# Patient Record
Sex: Male | Born: 1935 | Race: White | Hispanic: No | Marital: Married | Smoking: Former smoker
Health system: Southern US, Community
[De-identification: ages and names within clinical notes are randomized; demographics above are authoritative.]

## PROBLEM LIST (undated history)

## (undated) DIAGNOSIS — F419 Anxiety disorder, unspecified: Secondary | ICD-10-CM

## (undated) DIAGNOSIS — G459 Transient cerebral ischemic attack, unspecified: Secondary | ICD-10-CM

## (undated) HISTORY — DX: Transient cerebral ischemic attack, unspecified: G45.9

## (undated) HISTORY — DX: Anxiety disorder, unspecified: F41.9

## (undated) HISTORY — PX: TONSILLECTOMY AND ADENOIDECTOMY: SUR1326

## (undated) HISTORY — PX: HAND SURGERY: SHX662

## (undated) HISTORY — PX: HEMORRHOID SURGERY: SHX153

---

## 2007-10-27 ENCOUNTER — Ambulatory Visit: Payer: Self-pay | Admitting: Unknown Physician Specialty

## 2008-11-06 ENCOUNTER — Ambulatory Visit: Payer: Self-pay | Admitting: Urology

## 2012-12-23 DIAGNOSIS — R339 Retention of urine, unspecified: Secondary | ICD-10-CM | POA: Insufficient documentation

## 2012-12-23 DIAGNOSIS — N200 Calculus of kidney: Secondary | ICD-10-CM | POA: Insufficient documentation

## 2012-12-23 DIAGNOSIS — N401 Enlarged prostate with lower urinary tract symptoms: Secondary | ICD-10-CM | POA: Insufficient documentation

## 2012-12-23 DIAGNOSIS — Z8042 Family history of malignant neoplasm of prostate: Secondary | ICD-10-CM | POA: Insufficient documentation

## 2013-03-03 ENCOUNTER — Ambulatory Visit: Payer: Self-pay | Admitting: Unknown Physician Specialty

## 2013-03-04 LAB — PATHOLOGY REPORT

## 2013-09-07 ENCOUNTER — Ambulatory Visit: Payer: Self-pay | Admitting: Internal Medicine

## 2014-07-05 DIAGNOSIS — M1990 Unspecified osteoarthritis, unspecified site: Secondary | ICD-10-CM | POA: Insufficient documentation

## 2014-07-05 DIAGNOSIS — I6930 Unspecified sequelae of cerebral infarction: Secondary | ICD-10-CM | POA: Insufficient documentation

## 2014-07-05 DIAGNOSIS — E785 Hyperlipidemia, unspecified: Secondary | ICD-10-CM | POA: Insufficient documentation

## 2014-07-05 DIAGNOSIS — E782 Mixed hyperlipidemia: Secondary | ICD-10-CM | POA: Insufficient documentation

## 2014-07-05 DIAGNOSIS — K219 Gastro-esophageal reflux disease without esophagitis: Secondary | ICD-10-CM | POA: Insufficient documentation

## 2014-07-05 DIAGNOSIS — F419 Anxiety disorder, unspecified: Secondary | ICD-10-CM | POA: Insufficient documentation

## 2014-07-05 DIAGNOSIS — I10 Essential (primary) hypertension: Secondary | ICD-10-CM | POA: Insufficient documentation

## 2014-08-16 ENCOUNTER — Ambulatory Visit: Payer: Self-pay | Admitting: Ophthalmology

## 2014-08-23 ENCOUNTER — Ambulatory Visit: Payer: Self-pay | Admitting: Ophthalmology

## 2014-10-25 ENCOUNTER — Ambulatory Visit: Payer: Self-pay | Admitting: Ophthalmology

## 2015-04-07 NOTE — Op Note (Signed)
PATIENT NAME:  James NormanGWINNETT, [name] MR#:  [id number] DATE OF BIRTH:  [birth year]  DATE OF PROCEDURE:  10/25/2014  PREOPERATIVE DIAGNOSIS:  Nuclear sclerotic cataract left eye.  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   PROCEDURE:  Phacoemulsification with posterior chamber intraocular lens implantation of the left eye.   LENS:  ZCB00 25.0-diopter posterior chamber intraocular lens.  ULTRASOUND TIME:  20% of 55 seconds.  CDE 11.0.  SURGEON:  [place]had Iolani Twilley, MD  ANESTHESIA:  Topical with tetracaine drops and 2% Xylocaine jelly.  COMPLICATIONS:  None.  DESCRIPTION OF PROCEDURE:  The patient was identified in the holding room and transported to the operating room and placed in the supine position under the operating microscope.  The left eye was identified as the operative eye and it was prepped and draped in the usual sterile ophthalmic fashion.  A 1 millimeter clear-corneal paracentesis was made at the 1:30 position.  The anterior chamber was filled with Viscoat viscoelastic.  A 2.4 millimeter keratome was used to make a near-clear corneal incision at the 10:30 position.  A curvilinear capsulorrhexis was made with a cystotome and capsulorrhexis forceps.  Balanced salt solution was used to hydrodissect and hydrodelineate the nucleus.  Phacoemulsification was then used in stop and chop fashion to remove the lens nucleus and epinucleus.  The remaining cortex was then removed using the irrigation and aspiration handpiece. Provisc was then placed into the capsular bag to distend it for lens placement.  A ZCB00 25.0-diopter lens was then injected into the capsular bag.  The remaining viscoelastic was aspirated.  Wounds were hydrated with balanced salt solution.  The anterior chamber was inflated to a physiologic pressure with balanced salt solution.  Next 0.1 mL of cefuroxime 10 mg/mL were injected into the anterior chamber  for a dose of 1 mg of intracameral antibiotic at the completion of the case. Miostat was placed into the anterior chamber to constrict the pupil.  No wound leaks were noted.  Topical Vigamox drops and Maxitrol ointment were applied to the eye.    The patient was taken to the recovery room in stable condition without complications of anesthesia or surgery.   ____________________________ Deirdre Evenerhadwick R. Gricel Copen, MD crb:MT D: 10/25/2014 14:53:00 ET T: 10/25/2014 16:52:01 ET JOB#: [id number]  cc: Deirdre Evenerhadwick R. Nechuma Boven, MD, <Dictator>     Lockie MolaHADWICK Jasdeep Kepner MD ELECTRONICALLY SIGNED 11/01/2014 11:30

## 2015-04-07 NOTE — Op Note (Signed)
PATIENT NAME:  James [name], James [name] MR#:  [name] DATE OF BIRTH:  Jun 10, 1934  DATE OF PROCEDURE:  08/23/2014  PREOPERATIVE DIAGNOSIS:  Nuclear sclerotic cataract, right eye.  POSTOPERATIVE DIAGNOSIS:  Nuclear sclerotic cataract, right eye.  PROCEDURE:  Phacoemulsification with posterior chamber intraocular lens implantation of the right eye.  LENS:  ZCBOO 25.0-diopter posterior chamber intraocular lens.  ULTRASOUND TIME: 19% of 1 minute, 7 seconds.  CDE 12.7.   SURGEON:  [place]had Lawayne Hartig, MD  ANESTHESIA:  Topical with tetracaine drops and 2% Xylocaine jelly.  COMPLICATIONS:  None.  DESCRIPTION OF PROCEDURE:  The patient was identified in the holding room and transported to the operating room and placed in the supine position under the operating microscope.  The right eye was identified as the operative eye and it was prepped and draped in the usual sterile ophthalmic fashion.  A 1 millimeter clear-corneal paracentesis was made at the 12 o'clock 1 mm position.  The anterior chamber was filled with Viscoat viscoelastic.  A 2.4 millimeter keratome was used to make a near-clear corneal incision at the 9 o'clock 2.4 mm position.  A curvilinear capsulorrhexis was made with a cystotome and capsulorrhexis forceps.  Balanced salt solution was used to hydrodissect and hydrodelineate the nucleus.  Phacoemulsification was then used in stop and chop fashion to remove the lens nucleus and epinucleus.  The remaining cortex was then removed using the irrigation and aspiration handpiece. Provisc was then placed into the capsular bag to distend it for lens placement.  A ZCBOO 25.0 diopter lens was then injected into the capsular bag.  The remaining viscoelastic was aspirated.  Wounds were hydrated with balanced salt solution.  The anterior chamber was inflated to a physiologic pressure with balanced salt solution.  0.1 mL of cefuroxime 10 mg/mL were injected into the anterior chamber for a dose of 1 mg of  intracameral antibiotic at the completion of the case. Miostat was placed into the anterior chamber to constrict the pupil.  No wound leaks were noted.  Topical Vigamox drops and Maxitrol ointment were applied to the eye.  The patient was taken to the recovery room in stable condition without complications of anesthesia or surgery.    ____________________________ Deirdre Evenerhadwick R. Layni Kreamer, MD crb:TT D: 08/23/2014 15:16:00 ET T: 08/23/2014 17:22:58 ET JOB#: [id number]  cc: Deirdre Evenerhadwick R. Jamille Yoshino, MD, <Dictator> Lockie MolaHADWICK Kerie Badger MD ELECTRONICALLY SIGNED 08/29/2014 21:49

## 2018-04-24 ENCOUNTER — Emergency Department
Admission: EM | Admit: 2018-04-24 | Discharge: 2018-04-24 | Disposition: A | Discharge status: Home / Self Care | Payer: Medicare PPO | Attending: Student in an Organized Health Care Education/Training Program | Admitting: Student in an Organized Health Care Education/Training Program

## 2018-04-24 ENCOUNTER — Other Ambulatory Visit: Payer: Self-pay

## 2018-04-24 ENCOUNTER — Emergency Department: Payer: Medicare PPO

## 2018-04-24 ENCOUNTER — Encounter: Payer: Self-pay | Admitting: Emergency Medicine

## 2018-04-24 DIAGNOSIS — Z87891 Personal history of nicotine dependence: Secondary | ICD-10-CM | POA: Insufficient documentation

## 2018-04-24 DIAGNOSIS — F13930 Sedative, hypnotic or anxiolytic use, unspecified with withdrawal, uncomplicated: Secondary | ICD-10-CM

## 2018-04-24 DIAGNOSIS — F13230 Sedative, hypnotic or anxiolytic dependence with withdrawal, uncomplicated: Secondary | ICD-10-CM | POA: Diagnosis not present

## 2018-04-24 DIAGNOSIS — R531 Weakness: Secondary | ICD-10-CM | POA: Diagnosis present

## 2018-04-24 LAB — CBC WITH DIFFERENTIAL/PLATELET
BASOS ABS: 0 10*3/uL (ref 0–0.1)
BASOS PCT: 1 %
EOS ABS: 0 10*3/uL (ref 0–0.7)
EOS PCT: 0 %
HCT: 43 % (ref 40.0–52.0)
Hemoglobin: 14.5 g/dL (ref 13.0–18.0)
Lymphocytes Relative: 9 %
Lymphs Abs: 0.7 10*3/uL — ABNORMAL LOW (ref 1.0–3.6)
MCH: 31.1 pg (ref 26.0–34.0)
MCHC: 33.7 g/dL (ref 32.0–36.0)
MCV: 92.3 fL (ref 80.0–100.0)
Monocytes Absolute: 0.4 10*3/uL (ref 0.2–1.0)
Monocytes Relative: 6 %
Neutro Abs: 6.5 10*3/uL (ref 1.4–6.5)
Neutrophils Relative %: 84 %
PLATELETS: 187 10*3/uL (ref 150–440)
RBC: 4.66 MIL/uL (ref 4.40–5.90)
RDW: 14.2 % (ref 11.5–14.5)
WBC: 7.7 10*3/uL (ref 3.8–10.6)

## 2018-04-24 LAB — COMPREHENSIVE METABOLIC PANEL
ALBUMIN: 4.3 g/dL (ref 3.5–5.0)
ALK PHOS: 57 U/L (ref 38–126)
ALT: 19 U/L (ref 17–63)
AST: 31 U/L (ref 15–41)
Anion gap: 6 (ref 5–15)
BUN: 21 mg/dL — ABNORMAL HIGH (ref 6–20)
CHLORIDE: 102 mmol/L (ref 101–111)
CO2: 28 mmol/L (ref 22–32)
CREATININE: 1 mg/dL (ref 0.61–1.24)
Calcium: 9.3 mg/dL (ref 8.9–10.3)
GFR calc non Af Amer: >60 mL/min (ref >60)
GLUCOSE: 214 mg/dL — AB (ref 65–99)
Potassium: 4 mmol/L (ref 3.5–5.1)
SODIUM: 136 mmol/L (ref 135–145)
Total Bilirubin: 0.6 mg/dL (ref 0.3–1.2)
Total Protein: 8.1 g/dL (ref 6.5–8.1)

## 2018-04-24 LAB — TROPONIN I

## 2018-04-24 MED ORDER — CHLORDIAZEPOXIDE HCL 25 MG PO CAPS
25.0000 mg | ORAL_CAPSULE | Freq: Once | ORAL | Status: AC
  Administered 2018-04-24: 25 mg via ORAL
  Filled 2018-04-24: qty 1

## 2018-04-24 MED ORDER — ALPRAZOLAM 1 MG PO TABS: 1.0000 mg | ORAL_TABLET | Freq: Every evening | ORAL | 0 refills | Status: AC | PRN

## 2018-04-24 MED ORDER — ALPRAZOLAM 0.5 MG PO TABS
1.0000 mg | ORAL_TABLET | Freq: Once | ORAL | Status: AC
  Administered 2018-04-24: 1 mg via ORAL
  Filled 2018-04-24: qty 2

## 2018-04-24 NOTE — ED Provider Notes (Signed)
[facility] Emergency Department Provider Note    First MD Initiated Contact with Patient 04/24/18 1106     (approximate)  I have reviewed the triage vital signs and the nursing notes.   HISTORY  Chief Complaint Weakness    HPI Murphy Duzan. is a 84 y.o. male presents to the ER with jittery sensation as well as sensation in his chest that he feels that someone was ripping breath out of his chest today.  Patient states he has been on Xanax for over 30 years taking 2 mg nightly noted on Tuesday that he had run out.  He did get a refill but he gets his medications from a pharmacy in [place] so he had not received them.  For the past 2 nights has not been able to sleep.  Feeling very anxious.  No hallucinations.  No chest pain or pressure at this time.  No headache.  No blurry vision.   History reviewed. No pertinent past medical history. No family history on file.  There are no active problems to display for this patient.     Prior to Admission medications   Not on File    Allergies Patient has no known allergies.    Social History Social History   Tobacco Use  . Smoking status: Former Smoker  Substance Use Topics  . Alcohol use: Not on file  . Drug use: Not on file    Review of Systems Patient denies headaches, rhinorrhea, blurry vision, numbness, shortness of breath, chest pain, edema, cough, abdominal pain, nausea, vomiting, diarrhea, dysuria, fevers, rashes or hallucinations unless otherwise stated above in HPI. ____________________________________________   PHYSICAL EXAM:  VITAL SIGNS: Vitals:   04/24/18 1122  BP: 121/62  Pulse: (!) 114  Resp: 20  Temp: 99 F (37.2 C)  SpO2: 96%    Constitutional: Alert and oriented.  in no acute distress. Eyes: Conjunctivae are normal.  Head: Atraumatic. Nose: No congestion/rhinnorhea. Mouth/Throat: Mucous membranes are moist.   Neck: No stridor. Painless ROM.    Cardiovascular: borderline tachycardic rate, regular rhythm. Grossly normal heart sounds.  Good peripheral circulation. Respiratory: Normal respiratory effort.  No retractions. Lungs CTAB. Gastrointestinal: Soft and nontender. No distention. No abdominal bruits. No CVA tenderness. Genitourinary:  Musculoskeletal: No lower extremity tenderness nor edema.  No joint effusions. Neurologic:  + tongue fasciculations with faint resting tremor. Normal speech and language. No gross focal neurologic deficits are appreciated. No facial droop Skin:  Skin is warm, dry and intact. No rash noted. Psychiatric: Mood and affect are normal. Speech and behavior are normal.  ____________________________________________   LABS (all labs ordered are listed, but only abnormal results are displayed)  No results found for this or any previous visit (from the past 24 hour(s)). ____________________________________________  EKG My review and personal interpretation at Time: 11:02   Indication: weakness  Rate: 115  Rhythm: sinus Axis: normal Other: nonspecific st abn, no stemi, poor r wave progression ____________________________________________  RADIOLOGY  I personally reviewed all radiographic images ordered to evaluate for the above acute complaints and reviewed radiology reports and findings.  These findings were personally discussed with the patient.  Please see medical record for radiology report.  ____________________________________________   PROCEDURES  Procedure(s) performed:  Procedures    Critical Care performed: no ____________________________________________   INITIAL IMPRESSION / ASSESSMENT AND PLAN / ED COURSE  Pertinent labs & imaging results that were available during my care of the patient were reviewed by me and considered  in my medical decision making (see chart for details).  DDX: Dehydration, sepsis, pna, uti, hypoglycemia, cva, drug effect, withdrawal,  encephalitis   Deuntae Kocsis. is a 84 y.o. who presents to the ED with was as described above.  Patient well-appearing.  History and exam is clinically consistent with acute benzodiazepine withdrawal.  No evidence of focal neuro deficits to suggest CVA.  Blood work is reassuring.  Patient given Xanax and Librium with improvement in his symptoms.  He is able to improve with a steady gait and tolerating oral hydration.  No evidence of ACS.  Given his presentation will be discharged with a short course of Xanax until his home medication can be refilled.  Discussed with patient and family alternative medications and that they could discuss steps to weaning off the benzodiazepine with their PCP.      As part of my medical decision making, I reviewed the following data within the electronic MEDICAL RECORD NUMBER Nursing notes reviewed and incorporated, Labs reviewed, notes from prior ED visits.   ____________________________________________   FINAL CLINICAL IMPRESSION(S) / ED DIAGNOSES  Final diagnoses:  Withdrawal from benzodiazepine, uncomplicated (HCC)      NEW MEDICATIONS STARTED DURING THIS VISIT:  New Prescriptions   No medications on file     Note:  This document was prepared using Dragon voice recognition software and may include unintentional dictation errors.    Willy Eddy, MD 04/24/18 1340

## 2018-04-24 NOTE — ED Triage Notes (Signed)
States general weakness had sudden onset this am. No focal weakness. Denies chest pain or SOB. States regularly takes xanax and ran out 4 days ago.

## 2018-04-24 NOTE — ED Triage Notes (Signed)
Wife states that he hasn't slept in 2nights, waiting for his meds to be mailed, pt states that he stayed in bed all day yesterday, no energy, pt states while up walking this am he felt a "whoosh" across his chest and his legs feel weak as well as his voice is [employer]

## 2018-04-24 NOTE — ED Notes (Signed)
Takes 1/2 sandwich and juice. tol well. amb in room by self without difficulty.

## 2018-08-04 ENCOUNTER — Ambulatory Visit: Payer: Medicare PPO | Admitting: Urology

## 2018-08-04 ENCOUNTER — Encounter: Payer: Self-pay | Admitting: Urology

## 2018-08-04 VITALS — BP 115/75 | HR 93 | Ht 69.0 in | Wt 158.5 lb

## 2018-08-04 DIAGNOSIS — N138 Other obstructive and reflux uropathy: Secondary | ICD-10-CM

## 2018-08-04 DIAGNOSIS — N401 Enlarged prostate with lower urinary tract symptoms: Secondary | ICD-10-CM

## 2018-08-04 MED ORDER — FINASTERIDE 5 MG PO TABS: 5.0000 mg | ORAL_TABLET | Freq: Every day | ORAL | 3 refills | Status: DC

## 2018-08-04 MED ORDER — TAMSULOSIN HCL 0.4 MG PO CAPS: 0.4000 mg | ORAL_CAPSULE | Freq: Every day | ORAL | 3 refills | Status: DC

## 2018-08-04 NOTE — Progress Notes (Signed)
08/04/2018 1:06 PM   Laveda Normanharles F Dejoy Jr. Oct 25, 1934 [id number]  Referring provider: Marguarite ArbourSparks, Jeffrey D, MD [address] [place], [place] [address]  Chief Complaint  Patient presents with  . Establish Care    HPI: 84 year old male presents to establish local urologic care.  He has been followed by Dr. Achilles Dunkope for several years for BPH and [facility].  He remains on finasteride and Flomax.  He denies bothersome lower urinary tract symptoms.  He denies dysuria or gross hematuria.  He has no flank, abdominal, pelvic or scrotal pain.  He has a family history of prostate cancer.  He had a PSA checked by his PCP in May 2019 which was 0.04 (uncorrected).   PMH: Past Medical History:  Diagnosis Date  . Anxiety   . TIA (transient ischemic attack)     Surgical History: History reviewed. No pertinent surgical history.  Home Medications:  Allergies as of 08/04/2018   No Known Allergies     Medication List        Accurate as of 08/04/18  1:06 PM. Always use your most recent med list.          ALPRAZolam 1 MG tablet Commonly known as:  XANAX Take 1 tablet (1 mg total) by mouth at bedtime as needed for sleep.   amitriptyline 25 MG tablet Commonly known as:  ELAVIL   clopidogrel 75 MG tablet Commonly known as:  PLAVIX   dutasteride 0.5 MG capsule Commonly known as:  AVODART Take by mouth.   finasteride 5 MG tablet Commonly known as:  PROSCAR Take by mouth.       Allergies: No Known Allergies  Family History: Family History  Problem Relation Age of Onset  . Prostate cancer Father     Social History:  reports that he has quit smoking. He quit after 35.00 years of use. He has never used smokeless tobacco. He reports that he drank alcohol. He reports that he does not use drugs.  ROS: UROLOGY Frequent Urination?: Yes Hard to postpone urination?: No Burning/pain with urination?: No Get up at night to urinate?: No Leakage of urine?:  No Urine stream starts and stops?: No Trouble starting stream?: No Do you have to strain to urinate?: No Blood in urine?: No Urinary tract infection?: No Sexually transmitted disease?: No Injury to kidneys or bladder?: No Painful intercourse?: No Weak stream?: No Erection problems?: No Penile pain?: No  Gastrointestinal Nausea?: No Vomiting?: No Indigestion/heartburn?: No Diarrhea?: No Constipation?: No  Constitutional Fever: No Night sweats?: No Weight loss?: No Fatigue?: No  Skin Skin rash/lesions?: No Itching?: No  Eyes Blurred vision?: No Double vision?: No  Ears/Nose/Throat Sore throat?: No Sinus problems?: No  Hematologic/Lymphatic Swollen glands?: No Easy bruising?: Yes  Cardiovascular Leg swelling?: No Chest pain?: No  Respiratory Cough?: No Shortness of breath?: No  Endocrine Excessive thirst?: No  Musculoskeletal Back pain?: No Joint pain?: No  Neurological Headaches?: No Dizziness?: No  Psychologic Depression?: No Anxiety?: Yes  Physical Exam: BP 115/75 (BP Location: Left Arm, Patient Position: Sitting, Cuff Size: Normal)   Pulse 93   Ht 5\' 9"  (1.753 m)   Wt 158 lb 8 oz (71.9 kg)   BMI 23.41 kg/m   Constitutional:  Alert and oriented, No acute distress. HEENT: [place] AT, moist mucus membranes.  Trachea midline, no masses. Cardiovascular: No clubbing, cyanosis, or edema. Respiratory: Normal respiratory effort, no increased work of breathing. GI: Abdomen is soft, nontender, nondistended,  no abdominal masses GU: No CVA tenderness.  Prostate 30 g, smooth without nodules Lymph: No cervical or inguinal lymphadenopathy. Skin: No rashes, bruises or suspicious lesions. Neurologic: Grossly intact, no focal deficits, moving all 4 extremities. Psychiatric: Normal mood and affect.   Assessment & Plan:   84 year old male with stable lower urinary tract symptoms on tamsulosin and finasteride.  Both of these medications were refilled.   Continue annual follow-up.  Based on his age and extremely low PSA would recommend discontinuing prostate cancer screening based on AUA recommendations.   Riki AltesScott C Avanna Sowder, MD  [facility] [address], [address] [place], [place] [address] [phone]

## 2019-04-27 DIAGNOSIS — G629 Polyneuropathy, unspecified: Secondary | ICD-10-CM | POA: Insufficient documentation

## 2019-08-05 ENCOUNTER — Ambulatory Visit: Payer: Medicare PPO | Admitting: Urology

## 2019-08-05 ENCOUNTER — Encounter: Payer: Self-pay | Admitting: Urology

## 2019-09-30 ENCOUNTER — Encounter: Payer: Self-pay | Admitting: Urology

## 2019-09-30 ENCOUNTER — Other Ambulatory Visit: Payer: Self-pay

## 2019-09-30 ENCOUNTER — Ambulatory Visit (INDEPENDENT_AMBULATORY_CARE_PROVIDER_SITE_OTHER): Payer: Medicare PPO | Admitting: Urology

## 2019-09-30 VITALS — BP 112/76 | HR 115 | Ht 68.0 in | Wt 162.0 lb

## 2019-09-30 DIAGNOSIS — N401 Enlarged prostate with lower urinary tract symptoms: Secondary | ICD-10-CM | POA: Diagnosis not present

## 2019-09-30 NOTE — Progress Notes (Signed)
09/30/2019 11:31 AM   James [name]. 1934/06/26 [id number]  Referring provider: Marguarite Arbour, MD [address] [place],  [place] [address]  Chief Complaint  Patient presents with  . Benign Prostatic Hypertrophy    HPI: 85 y.o. male with a history of BPH previously followed by Dr. Achilles Dunk.  I initially saw him on 08/04/2018 and he presents for routine follow-up.  He remains on tamsulosin/finasteride and has no bothersome lower urinary tract symptoms.  IPSS completed today was 0/35.  Denies dysuria, gross hematuria and he has no flank, abdominal or pelvic pain.  PMH: Past Medical History:  Diagnosis Date  . Anxiety   . TIA (transient ischemic attack)     Surgical History: No past surgical history on file.  Home Medications:  Allergies as of 09/30/2019   No Known Allergies     Medication List       Accurate as of September 30, 2019 11:31 AM. If you have any questions, ask your nurse or doctor.        ALPRAZolam 1 MG tablet Commonly known as: XANAX TAKE 2 TABLETS (2 MG TOTAL) BY MOUTH NIGHTLY AS NEEDED FOR SLEEP   amitriptyline 25 MG tablet Commonly known as: ELAVIL   clopidogrel 75 MG tablet Commonly known as: PLAVIX   finasteride 5 MG tablet Commonly known as: PROSCAR Take 1 tablet (5 mg total) by mouth daily.   tamsulosin 0.4 MG Caps capsule Commonly known as: FLOMAX Take 1 capsule (0.4 mg total) by mouth daily.       Allergies: No Known Allergies  Family History: Family History  Problem Relation Age of Onset  . Prostate cancer Father     Social History:  reports that he has quit smoking. He quit after 35.00 years of use. He has never used smokeless tobacco. He reports previous alcohol use. He reports that he does not use drugs.  ROS: UROLOGY Frequent Urination?: Yes Hard to postpone urination?: No Burning/pain with urination?: No Get up at night to urinate?: No Leakage of urine?: No Urine stream starts and stops?: No Trouble  starting stream?: Yes Do you have to strain to urinate?: No Blood in urine?: No Urinary tract infection?: No Sexually transmitted disease?: No Injury to kidneys or bladder?: No Painful intercourse?: No Weak stream?: No Erection problems?: No Penile pain?: No  Gastrointestinal Nausea?: No Vomiting?: No Indigestion/heartburn?: No Diarrhea?: No Constipation?: No  Constitutional Fever: No Night sweats?: No Weight loss?: No Fatigue?: No  Skin Skin rash/lesions?: No Itching?: Yes  Eyes Blurred vision?: No Double vision?: No  Ears/Nose/Throat Sore throat?: No Sinus problems?: Yes  Hematologic/Lymphatic Swollen glands?: No Easy bruising?: No  Cardiovascular Leg swelling?: No Chest pain?: No  Respiratory Cough?: No Shortness of breath?: No  Endocrine Excessive thirst?: No  Musculoskeletal Back pain?: Yes Joint pain?: No  Neurological Headaches?: No Dizziness?: No  Psychologic Depression?: No Anxiety?: No  Physical Exam: BP 112/76 (BP Location: Left Arm, Patient Position: Sitting, Cuff Size: Normal)   Pulse (!) 115   Ht 5\' 8"  (1.727 m)   Wt 162 lb (73.5 kg)   BMI 24.63 kg/m   Constitutional:  Alert and oriented, No acute distress. HEENT: [place] AT, moist mucus membranes.  Trachea midline, no masses. Cardiovascular: No clubbing, cyanosis, or edema. Respiratory: Normal respiratory effort, no increased work of breathing. GI: Abdomen is soft, nontender, nondistended, no abdominal masses GU: No CVA tenderness Lymph: No cervical or inguinal lymphadenopathy. Skin: No rashes, bruises or suspicious lesions. Neurologic:  Grossly intact, no focal deficits, moving all 4 extremities. Psychiatric: Normal mood and affect.   Assessment & Plan:    - BPH with lower urinary tract symptoms Stable voiding symptoms on tamsulosin/finasteride.   Abbie Sons, [place] [address], [place], [place] [address] [phone]

## 2019-10-02 ENCOUNTER — Encounter: Payer: Self-pay | Admitting: Urology

## 2019-10-02 MED ORDER — TAMSULOSIN HCL 0.4 MG PO CAPS: 0.4000 mg | ORAL_CAPSULE | Freq: Every day | ORAL | 3 refills | Status: DC

## 2019-10-02 MED ORDER — FINASTERIDE 5 MG PO TABS: 5.0000 mg | ORAL_TABLET | Freq: Every day | ORAL | 3 refills | Status: DC

## 2019-10-31 ENCOUNTER — Other Ambulatory Visit: Payer: Self-pay | Admitting: Neurology

## 2019-10-31 DIAGNOSIS — R200 Anesthesia of skin: Secondary | ICD-10-CM

## 2019-10-31 DIAGNOSIS — R202 Paresthesia of skin: Secondary | ICD-10-CM

## 2019-11-11 ENCOUNTER — Other Ambulatory Visit: Payer: Self-pay | Admitting: Urology

## 2019-11-11 ENCOUNTER — Ambulatory Visit
Admission: RE | Admit: 2019-11-11 | Discharge: 2019-11-11 | Disposition: A | Discharge status: Home / Self Care | Payer: Medicare PPO | Source: Ambulatory Visit | Attending: Neurology | Admitting: Neurology

## 2019-11-11 ENCOUNTER — Other Ambulatory Visit: Payer: Self-pay

## 2019-11-11 DIAGNOSIS — R202 Paresthesia of skin: Secondary | ICD-10-CM | POA: Insufficient documentation

## 2019-11-11 DIAGNOSIS — R200 Anesthesia of skin: Secondary | ICD-10-CM | POA: Insufficient documentation

## 2019-11-11 MED ORDER — GADOBUTROL 1 MMOL/ML IV SOLN
7.0000 mL | Freq: Once | INTRAVENOUS | Status: AC | PRN
  Administered 2019-11-11: 7 mL via INTRAVENOUS

## 2019-11-14 ENCOUNTER — Other Ambulatory Visit (HOSPITAL_COMMUNITY): Payer: Self-pay | Admitting: Internal Medicine

## 2019-11-14 ENCOUNTER — Other Ambulatory Visit: Payer: Self-pay | Admitting: Internal Medicine

## 2019-11-14 DIAGNOSIS — G629 Polyneuropathy, unspecified: Secondary | ICD-10-CM

## 2019-11-17 ENCOUNTER — Ambulatory Visit
Admission: RE | Admit: 2019-11-17 | Discharge: 2019-11-17 | Disposition: A | Discharge status: Home / Self Care | Payer: Medicare PPO | Source: Ambulatory Visit | Attending: Internal Medicine | Admitting: Internal Medicine

## 2019-11-17 ENCOUNTER — Other Ambulatory Visit: Payer: Self-pay

## 2019-11-17 DIAGNOSIS — G629 Polyneuropathy, unspecified: Secondary | ICD-10-CM | POA: Diagnosis present

## 2019-11-29 ENCOUNTER — Other Ambulatory Visit: Payer: Self-pay

## 2019-11-29 ENCOUNTER — Ambulatory Visit (INDEPENDENT_AMBULATORY_CARE_PROVIDER_SITE_OTHER): Payer: Medicare PPO | Admitting: Vascular Surgery

## 2019-11-29 ENCOUNTER — Encounter (INDEPENDENT_AMBULATORY_CARE_PROVIDER_SITE_OTHER): Payer: Self-pay | Admitting: Vascular Surgery

## 2019-11-29 VITALS — BP 108/71 | HR 101 | Resp 16 | Wt 160.4 lb

## 2019-11-29 DIAGNOSIS — I10 Essential (primary) hypertension: Secondary | ICD-10-CM | POA: Diagnosis not present

## 2019-11-29 DIAGNOSIS — I739 Peripheral vascular disease, unspecified: Secondary | ICD-10-CM

## 2019-11-29 DIAGNOSIS — E785 Hyperlipidemia, unspecified: Secondary | ICD-10-CM

## 2019-11-29 NOTE — Assessment & Plan Note (Signed)
blood pressure control important in reducing the progression of atherosclerotic disease. On appropriate oral medications.  

## 2019-11-29 NOTE — Patient Instructions (Signed)
Peripheral Vascular Disease  Peripheral vascular disease (PVD) is a disease of the blood vessels that are not part of your heart and brain. A simple term for PVD is poor circulation. In most cases, PVD narrows the blood vessels that carry blood from your heart to the rest of your body. This can reduce the supply of blood to your arms, legs, and internal organs, like your stomach or kidneys. However, PVD most often affects a person's lower legs and feet. Without treatment, PVD tends to get worse. PVD can also lead to acute ischemic limb. This is when an arm or leg suddenly cannot get enough blood. This is a medical emergency. Follow these instructions at home: Lifestyle  Do not use any products that contain nicotine or tobacco, such as cigarettes and e-cigarettes. If you need help quitting, ask your doctor.  Lose weight if you are overweight. Or, stay at a healthy weight as told by your doctor.  Eat a diet that is low in fat and cholesterol. If you need help, ask your doctor.  Exercise regularly. Ask your doctor for activities that are right for you. General instructions  Take over-the-counter and prescription medicines only as told by your doctor.  Take good care of your feet: ? Wear comfortable shoes that fit well. ? Check your feet often for any cuts or sores.  Keep all follow-up visits as told by your doctor This is important. Contact a doctor if:  You have cramps in your legs when you walk.  You have leg pain when you are at rest.  You have coldness in a leg or foot.  Your skin changes.  You are unable to get or have an erection (erectile dysfunction).  You have cuts or sores on your feet that do not heal. Get help right away if:  Your arm or leg turns cold, numb, and blue.  Your arms or legs become red, warm, swollen, painful, or numb.  You have chest pain.  You have trouble breathing.  You suddenly have weakness in your face, arm, or leg.  You become very  confused or you cannot speak.  You suddenly have a very bad headache.  You suddenly cannot see. Summary  Peripheral vascular disease (PVD) is a disease of the blood vessels.  A simple term for PVD is poor circulation. Without treatment, PVD tends to get worse.  Treatment may include exercise, low fat and low cholesterol diet, and quitting smoking. This information is not intended to replace advice given to you by your health care provider. Make sure you discuss any questions you have with your health care provider. Document Released: 02/25/2010 Document Revised: 11/13/2017 Document Reviewed: 01/08/2017 Elsevier Patient Education  2020 [employer]  

## 2019-11-29 NOTE — Progress Notes (Signed)
Patient ID: James Aho., male   DOB: Oct 19, 1934, 85 y.o.   MRN: [id number]  Chief Complaint  Patient presents with  . New Patient (Initial Visit)    ref Doy Hutching for PAD    HPI James [name]. is a 85 y.o. male.  I am asked to see the patient by Dr. Doy Hutching for evaluation of PAD.  The patient is a fair historian but he does describe symptoms concerning for claudication.  He says his legs hurt and get tired easily even with short amounts of exercise or activity.  He says he does try to do some upper body exercises to keep fit but he does not like walking due to the pain in his legs.  Both legs are affected but the left may be slightly worse.  No open ulceration or infection.  No fevers or chills.  The patient describes this having gone on for months and seems to be getting gradually worse.  There is no clear inciting event or causative factor that started the symptoms.  Nothing has really made this better.  His primary care physician ordered ABIs at rest which showed a right ABI of 0.98 and a left ABI 0.91 but specific mention was made of considering checking these with activity.  The waveforms were fairly good.   Past Medical History:  Diagnosis Date  . Anxiety   . TIA (transient ischemic attack)     Past Surgical History:  Procedure Laterality Date  . HAND SURGERY Left   . HEMORRHOID SURGERY    . TONSILLECTOMY AND ADENOIDECTOMY       Family History  Problem Relation Age of Onset  . Prostate cancer Father   no bleeding disorders, clotting disorders, autoimmune diseases, or aneurysms  Social History   Tobacco Use  . Smoking status: Former Smoker    Years: 35.00  . Smokeless tobacco: Never Used  Substance Use Topics  . Alcohol use: Not Currently  . Drug use: Never    No Known Allergies  Current Outpatient Medications  Medication Sig Dispense Refill  . ALPRAZolam (XANAX) 1 MG tablet TAKE 2 TABLETS (2 MG TOTAL) BY MOUTH NIGHTLY AS NEEDED FOR SLEEP    .  amitriptyline (ELAVIL) 25 MG tablet     . clopidogrel (PLAVIX) 75 MG tablet     . finasteride (PROSCAR) 5 MG tablet TAKE 1 TABLET EVERY DAY 90 tablet 3  . tamsulosin (FLOMAX) 0.4 MG CAPS capsule TAKE 1 CAPSULE EVERY DAY 90 capsule 3   No current facility-administered medications for this visit.      REVIEW OF SYSTEMS (Negative unless checked)  Constitutional: [] Weight loss  [] Fever  [] Chills Cardiac: [] Chest pain   [] Chest pressure   [] Palpitations   [] Shortness of breath when laying flat   [] Shortness of breath at rest   [] Shortness of breath with exertion. Vascular:  [] Pain in legs with walking   [] Pain in legs at rest   [] Pain in legs when laying flat   [] Claudication   [] Pain in feet when walking  [] Pain in feet at rest  [] Pain in feet when laying flat   [] History of DVT   [] Phlebitis   [] Swelling in legs   [] Varicose veins   [] Non-healing ulcers Pulmonary:   [] Uses home oxygen   [] Productive cough   [] Hemoptysis   [] Wheeze  [] COPD   [] Asthma Neurologic:  [] Dizziness  [] Blackouts   [] Seizures   [] History of stroke   [] History of TIA  [] Aphasia   []   Temporary blindness   [] Dysphagia   [] Weakness or numbness in arms   [] Weakness or numbness in legs Musculoskeletal:  [] Arthritis   [] Joint swelling   [] Joint pain   [] Low back pain Hematologic:  [] Easy bruising  [] Easy bleeding   [] Hypercoagulable state   [] Anemic  [] Hepatitis Gastrointestinal:  [] Blood in stool   [] Vomiting blood  [x] Gastroesophageal reflux/heartburn   [] Abdominal pain Genitourinary:  [] Chronic kidney disease   [] Difficult urination  [] Frequent urination  [] Burning with urination   [] Hematuria Skin:  [] Rashes   [] Ulcers   [] Wounds Psychological:  [x] History of anxiety   []  History of major depression.    Physical Exam BP 108/71 (BP Location: Right Arm)   Pulse (!) 101   Resp 16   Wt 160 lb 6.4 oz (72.8 kg)   BMI 24.39 kg/m  Gen:  WD/WN, NAD Head: [place]/AT, No temporalis wasting.  Ear/Nose/Throat: Hearing grossly  intact, nares w/o erythema or drainage, oropharynx w/o Erythema/Exudate Eyes: Conjunctiva clear, sclera non-icteric  Neck: trachea midline.  No JVD.  Pulmonary:  Good air movement, respirations not labored, no use of accessory muscles  Cardiac: slightly tachycardic Vascular:  Vessel Right Left  Radial Palpable Palpable                          DP 2+ 2+  PT 1+ 1+   Gastrointestinal:. No masses, surgical incisions, or scars. Musculoskeletal: M/S 5/5 throughout.  Extremities without ischemic changes.  No deformity or atrophy. No edema. Neurologic: Sensation grossly intact in extremities.  Symmetrical.  Speech is fluent. Motor exam as listed above. Psychiatric: Judgment intact, Mood & affect appropriate for pt's clinical situation. Dermatologic: No rashes or ulcers noted.  No cellulitis or open wounds.    Radiology MR BRAIN W WO CONTRAST  Result Date: 11/11/2019 CLINICAL DATA:  Numbness pain and heaviness of bilateral feet since 2018 EXAM: MRI HEAD WITHOUT AND WITH CONTRAST TECHNIQUE: Multiplanar, multiecho pulse sequences of the brain and surrounding structures were obtained without and with intravenous contrast. CONTRAST:  54mL GADAVIST GADOBUTROL 1 MMOL/ML IV SOLN COMPARISON:  09/07/2013 FINDINGS: Brain: Confluent FLAIR hyperintensity in the cerebral white matter consistent with chronic small vessel ischemia. Discrete remote perforator infarct in the right basal ganglia. Remote lacunar infarct in the left thalamus. There is cerebral volume loss with asymmetric involvement of the medial right temporal lobe that has progressed from 2014. Accentuated dilated perivascular spaces in the deep gray nuclei, often attributed to aging or hypertension. No acute or subacute infarct, acute hemorrhage, hydrocephalus, or collection. Vascular: Normal flow voids Skull and upper cervical spine: Normal marrow signal Sinuses/Orbits: Bilateral cataract resection IMPRESSION: 1. Advanced chronic small vessel  ischemia. 2. Brain atrophy with asymmetric greater involvement of the medial right temporal lobe that has progressed from 2014. 3. No reversible finding. Electronically Signed   By: M.D.   On: 11/11/2019 20:07   ARTERIAL ABI (SCREENING LOWER EXTREMITY)  Result Date: 11/18/2019 CLINICAL DATA:  Previous tobacco abuse.  Leg numbness. EXAM: NONINVASIVE PHYSIOLOGIC VASCULAR STUDY OF BILATERAL LOWER EXTREMITIES TECHNIQUE: Evaluation of both lower extremities were performed at rest, including calculation of ankle-brachial indices with single level Doppler, pressure and pulse volume recording. COMPARISON:  None. FINDINGS: Right ABI:  0.98 Left ABI:  0.91 Right Lower Extremity:  Normal arterial waveforms at the ankle. Left Lower Extremity:  Normal arterial waveforms at the ankle. IMPRESSION: Mildly decreased bilateral ABIs at rest suggest borderline peripheral arterial disease. Consider post exercise  testing if  symptoms persist. Electronically Signed   By: Corlis Leak  Hassell M.D.   On: 11/18/2019 09:03    Labs No results found for this or any previous visit (from the past 2160 hour(s)).  Assessment/Plan:  HTN (hypertension) blood pressure control important in reducing the progression of atherosclerotic disease. On appropriate oral medications.   Hyperlipidemia lipid control important in reducing the progression of atherosclerotic disease.    PAD (peripheral artery disease) (HCC) The patient describes some degree of claudication symptoms as well as likely some neuropathy of the lower extremities.  Perfusion can certainly contributes to the neuropathy symptoms as well.  His overall perfusion is relatively well-preserved at rest.  We had a long talk today about the pathophysiology and natural history of peripheral arterial disease.  He has been recommended to have physical therapy by another physician and I would strongly recommend that.  That is likely to build up some collaterals and improve  his lower extremity perfusion.  I would recommend a trial of increased activity and then reassessing in 1 to 2 months with duplex studies for further evaluation on any levels of stenosis or disease.  He does not have any immediate limb threatening symptoms that would require intervention at this time.  They voiced their understanding and are agreeable to proceed with our plan of care.      Festus BarrenJason Justine Dines 11/29/2019, 11:42 AM   This note was created with Dragon medical transcription system.  Any errors from dictation are unintentional.

## 2019-11-29 NOTE — Assessment & Plan Note (Signed)
lipid control important in reducing the progression of atherosclerotic disease.   

## 2019-11-29 NOTE — Assessment & Plan Note (Signed)
The patient describes some degree of claudication symptoms as well as likely some neuropathy of the lower extremities.  Perfusion can certainly contributes to the neuropathy symptoms as well.  His overall perfusion is relatively well-preserved at rest.  We had a long talk today about the pathophysiology and natural history of peripheral arterial disease.  He has been recommended to have physical therapy by another physician and I would strongly recommend that.  That is likely to build up some collaterals and improve his lower extremity perfusion.  I would recommend a trial of increased activity and then reassessing in 1 to 2 months with duplex studies for further evaluation on any levels of stenosis or disease.  He does not have any immediate limb threatening symptoms that would require intervention at this time.  They voiced their understanding and are agreeable to proceed with our plan of care.

## 2020-01-03 ENCOUNTER — Encounter (INDEPENDENT_AMBULATORY_CARE_PROVIDER_SITE_OTHER): Payer: Medicare PPO

## 2020-01-03 ENCOUNTER — Ambulatory Visit (INDEPENDENT_AMBULATORY_CARE_PROVIDER_SITE_OTHER): Payer: Medicare PPO | Admitting: Vascular Surgery

## 2020-01-17 ENCOUNTER — Encounter (INDEPENDENT_AMBULATORY_CARE_PROVIDER_SITE_OTHER): Payer: Self-pay | Admitting: Vascular Surgery

## 2020-01-17 ENCOUNTER — Ambulatory Visit (INDEPENDENT_AMBULATORY_CARE_PROVIDER_SITE_OTHER): Payer: Medicare PPO | Admitting: Vascular Surgery

## 2020-01-17 ENCOUNTER — Ambulatory Visit (INDEPENDENT_AMBULATORY_CARE_PROVIDER_SITE_OTHER): Payer: Medicare PPO

## 2020-01-17 ENCOUNTER — Other Ambulatory Visit: Payer: Self-pay

## 2020-01-17 VITALS — BP 115/75 | HR 91 | Resp 18 | Ht 68.0 in | Wt 160.0 lb

## 2020-01-17 DIAGNOSIS — E785 Hyperlipidemia, unspecified: Secondary | ICD-10-CM | POA: Diagnosis not present

## 2020-01-17 DIAGNOSIS — I10 Essential (primary) hypertension: Secondary | ICD-10-CM

## 2020-01-17 DIAGNOSIS — I739 Peripheral vascular disease, unspecified: Secondary | ICD-10-CM | POA: Diagnosis not present

## 2020-01-17 NOTE — Assessment & Plan Note (Signed)
We did duplex studies today which showed some mildly elevated velocities in the left SFA but no clear hemodynamically significant stenosis in either lower extremity below the inguinal ligament.  There were biphasic waveforms distally which would suggest potential aortoiliac disease.  His ABIs have been seen to be only mildly reduced previously. At this point, his perfusion is relatively well-maintained.  Although there may be some aortoiliac disease, his symptoms have not progressed.  We discussed angiogram versus a several month follow-up with aortoiliac duplex.  He would prefer the latter which is certainly reasonable without any limb threatening symptoms.  We will see him back with aortoiliac duplex in several months.

## 2020-01-17 NOTE — Progress Notes (Signed)
MRN : [id number]  James [name]. is a 86 y.o. (1934/07/31) male who presents with chief complaint of  Chief Complaint  Patient presents with  . Follow-up    ultrasound  .  History of Present Illness: Patient returns today in follow up of his leg pain and PAD.  He still is bothered more by numbness and neuropathic symptoms than true claudication.  He does have some claudication symptoms particularly with vigorous activity.  At this point, his symptoms are about the same as they were at his first visit a few months ago.  No ulceration or infection.  We did duplex studies today which showed some mildly elevated velocities in the left SFA but no clear hemodynamically significant stenosis in either lower extremity below the inguinal ligament.  There were biphasic waveforms distally which would suggest potential aortoiliac disease.  His ABIs have been seen to be only mildly reduced previously.  Current Outpatient Medications  Medication Sig Dispense Refill  . ALPRAZolam (XANAX) 1 MG tablet TAKE 2 TABLETS (2 MG TOTAL) BY MOUTH NIGHTLY AS NEEDED FOR SLEEP    . amitriptyline (ELAVIL) 25 MG tablet     . clopidogrel (PLAVIX) 75 MG tablet     . finasteride (PROSCAR) 5 MG tablet TAKE 1 TABLET EVERY DAY 90 tablet 3  . tamsulosin (FLOMAX) 0.4 MG CAPS capsule TAKE 1 CAPSULE EVERY DAY 90 capsule 3   No current facility-administered medications for this visit.    Past Medical History:  Diagnosis Date  . Anxiety   . TIA (transient ischemic attack)     Past Surgical History:  Procedure Laterality Date  . HAND SURGERY Left   . HEMORRHOID SURGERY    . TONSILLECTOMY AND ADENOIDECTOMY      Family History  Problem Relation Age of Onset  . Prostate cancer Father   no bleeding disorders, clotting disorders, autoimmune diseases, or aneurysms  Social History        Tobacco Use  . Smoking status: Former Smoker    Years: 35.00  . Smokeless tobacco: Never Used  Substance Use Topics    . Alcohol use: Not Currently  . Drug use: Never     No Known Allergies  REVIEW OF SYSTEMS (Negative unless checked)  Constitutional: [] ?Weight loss  [] ?Fever  [] ?Chills Cardiac: [] ?Chest pain   [] ?Chest pressure   [] ?Palpitations   [] ?Shortness of breath when laying flat   [] ?Shortness of breath at rest   [] ?Shortness of breath with exertion. Vascular:  [] ?Pain in legs with walking   [] ?Pain in legs at rest   [] ?Pain in legs when laying flat   [] ?Claudication   [] ?Pain in feet when walking  [] ?Pain in feet at rest  [] ?Pain in feet when laying flat   [] ?History of DVT   [] ?Phlebitis   [] ?Swelling in legs   [] ?Varicose veins   [] ?Non-healing ulcers Pulmonary:   [] ?Uses home oxygen   [] ?Productive cough   [] ?Hemoptysis   [] ?Wheeze  [] ?COPD   [] ?Asthma Neurologic:  [] ?Dizziness  [] ?Blackouts   [] ?Seizures   [] ?History of stroke   [] ?History of TIA  [] ?Aphasia   [] ?Temporary blindness   [] ?Dysphagia   [] ?Weakness or numbness in arms   [] ?Weakness or numbness in legs Musculoskeletal:  [] ?Arthritis   [] ?Joint swelling   [] ?Joint pain   [] ?Low back pain Hematologic:  [] ?Easy bruising  [] ?Easy bleeding   [] ?Hypercoagulable state   [] ?Anemic  [] ?Hepatitis Gastrointestinal:  [] ?Blood in stool   [] ?Vomiting blood  [x] ?Gastroesophageal reflux/heartburn   [] ?  Abdominal pain Genitourinary:  [] ?Chronic kidney disease   [] ?Difficult urination  [] ?Frequent urination  [] ?Burning with urination   [] ?Hematuria Skin:  [] ?Rashes   [] ?Ulcers   [] ?Wounds Psychological:  [x] ?History of anxiety   [] ? History of major depression.  Physical Examination  BP 115/75 (BP Location: Right Arm)   Pulse 91   Resp 18   Ht 5\' 8"  (1.727 m)   Wt 160 lb (72.6 kg)   BMI 24.33 kg/m  Gen:  WD/WN, NAD Head: [place]/AT, No temporalis wasting. Ear/Nose/Throat: Hearing grossly intact, nares w/o erythema or drainage Eyes: Conjunctiva clear. Sclera non-icteric Neck: Supple.  Trachea midline Pulmonary:  Good air movement, no use of  accessory muscles.  Cardiac: RRR, no JVD Vascular:  Vessel Right Left  Radial Palpable Palpable                          PT Palpable Palpable  DP Palpable Palpable    Musculoskeletal: M/S 5/5 throughout.  No deformity or atrophy.  No edema. Neurologic: Sensation grossly intact in extremities.  Symmetrical.  Speech is fluent.  Psychiatric: Judgment intact, Mood & affect appropriate for pt's clinical situation. Dermatologic: No rashes or ulcers noted.  No cellulitis or open wounds.       Labs No results found for this or any previous visit (from the past 2160 hour(s)).  Radiology No results found.  Assessment/Plan HTN (hypertension) blood pressure control important in reducing the progression of atherosclerotic disease. On appropriate oral medications.   Hyperlipidemia lipid control important in reducing the progression of atherosclerotic disease.   PAD (peripheral artery disease) (HCC) We did duplex studies today which showed some mildly elevated velocities in the left SFA but no clear hemodynamically significant stenosis in either lower extremity below the inguinal ligament.  There were biphasic waveforms distally which would suggest potential aortoiliac disease.  His ABIs have been seen to be only mildly reduced previously. At this point, his perfusion is relatively well-maintained.  Although there may be some aortoiliac disease, his symptoms have not progressed.  We discussed angiogram versus a several month follow-up with aortoiliac duplex.  He would prefer the latter which is certainly reasonable without any limb threatening symptoms.  We will see him back with aortoiliac duplex in several months.    Leotis Pain, MD  01/17/2020 12:42 PM    This note was created with Dragon medical transcription system.  Any errors from dictation are purely unintentional

## 2020-04-17 ENCOUNTER — Ambulatory Visit (INDEPENDENT_AMBULATORY_CARE_PROVIDER_SITE_OTHER): Payer: Medicare PPO | Admitting: Vascular Surgery

## 2020-04-17 ENCOUNTER — Other Ambulatory Visit: Payer: Self-pay

## 2020-04-17 ENCOUNTER — Ambulatory Visit (INDEPENDENT_AMBULATORY_CARE_PROVIDER_SITE_OTHER): Payer: Medicare PPO

## 2020-04-17 ENCOUNTER — Encounter (INDEPENDENT_AMBULATORY_CARE_PROVIDER_SITE_OTHER): Payer: Self-pay | Admitting: Vascular Surgery

## 2020-04-17 VITALS — BP 127/72 | HR 98 | Resp 16 | Wt 160.4 lb

## 2020-04-17 DIAGNOSIS — I739 Peripheral vascular disease, unspecified: Secondary | ICD-10-CM | POA: Diagnosis not present

## 2020-04-17 DIAGNOSIS — I10 Essential (primary) hypertension: Secondary | ICD-10-CM | POA: Diagnosis not present

## 2020-04-17 DIAGNOSIS — E785 Hyperlipidemia, unspecified: Secondary | ICD-10-CM | POA: Diagnosis not present

## 2020-04-17 NOTE — Progress Notes (Signed)
MRN : [id number]  James [name]. is a 86 y.o. ([birth year]) male who presents with chief complaint of  Chief Complaint  Patient presents with  . Follow-up    ultrasound follow up  .  History of Present Illness: Patient returns today in follow up of leg weakness and balance issues.  We performed exercise ABIs today to see if he had a drop in his ABI with exercise.  He did not.  His ABIs were in the normal range at 1.1 on the right and 1.0 on the left and had appropriate increase with exercise consistent with no significant arterial insufficiency.  Current Outpatient Medications  Medication Sig Dispense Refill  . ALPRAZolam (XANAX) 1 MG tablet TAKE 2 TABLETS (2 MG TOTAL) BY MOUTH NIGHTLY AS NEEDED FOR SLEEP    . amitriptyline (ELAVIL) 25 MG tablet     . clopidogrel (PLAVIX) 75 MG tablet     . finasteride (PROSCAR) 5 MG tablet TAKE 1 TABLET EVERY DAY 90 tablet 3  . tamsulosin (FLOMAX) 0.4 MG CAPS capsule TAKE 1 CAPSULE EVERY DAY 90 capsule 3   No current facility-administered medications for this visit.    Past Medical History:  Diagnosis Date  . Anxiety   . TIA (transient ischemic attack)     Past Surgical History:  Procedure Laterality Date  . HAND SURGERY Left   . HEMORRHOID SURGERY    . TONSILLECTOMY AND ADENOIDECTOMY      Family History  Problem Relation Age of Onset  . Prostate cancer Father   no bleeding disorders, clotting disorders, autoimmune diseases, or aneurysms  Social History        Tobacco Use  . Smoking status: Former Smoker    Years: 35.00  . Smokeless tobacco: Never Used  Substance Use Topics  . Alcohol use: Not Currently  . Drug use: Never     No Known Allergies  REVIEW OF SYSTEMS(Negative unless checked)  Constitutional: [] ??Weight loss[] ??Fever[] ??Chills Cardiac:[] ??Chest pain[] ??Chest pressure[] ??Palpitations [] ??Shortness of breath when laying flat [] ??Shortness of breath at rest [] ??Shortness of  breath with exertion. Vascular: [] ??Pain in legs with walking[] ??Pain in legsat rest[] ??Pain in legs when laying flat [] ??Claudication [] ??Pain in feet when walking [] ??Pain in feet at rest [] ??Pain in feet when laying flat [] ??History of DVT [] ??Phlebitis [] ??Swelling in legs [] ??Varicose veins [] ??Non-healing ulcers Pulmonary: [] ??Uses home oxygen [] ??Productive cough[] ??Hemoptysis [] ??Wheeze [] ??COPD [] ??Asthma Neurologic: [] ??Dizziness [] ??Blackouts [] ??Seizures [] ??History of stroke [] ??History of TIA[] ??Aphasia [] ??Temporary blindness[] ??Dysphagia [] ??Weaknessor numbness in arms [] ??Weakness or numbnessin legs Musculoskeletal: [] ??Arthritis [] ??Joint swelling [] ??Joint pain [] ??Low back pain Hematologic:[] ??Easy bruising[] ??Easy bleeding [] ??Hypercoagulable state [] ??Anemic [] ??Hepatitis Gastrointestinal:[] ??Blood in stool[] ??Vomiting blood[x] ??Gastroesophageal reflux/heartburn[] ??Abdominal pain Genitourinary: [] ??Chronic kidney disease [] ??Difficulturination [] ??Frequenturination [] ??Burning with urination[] ??Hematuria Skin: [] ??Rashes [] ??Ulcers [] ??Wounds Psychological: [x] ??History of anxiety[] ??History of major depression.    Physical Examination  BP 127/72 (BP Location: Right Arm)   Pulse 98   Resp 16   Wt 160 lb 6.4 oz (72.8 kg)   BMI 24.39 kg/m  Gen:  WD/WN, NAD.  Appears younger than stated age Head: [place]/AT, No temporalis wasting. Ear/Nose/Throat: Hearing somewhat decreased, nares w/o erythema or drainage Eyes: Conjunctiva clear. Sclera non-icteric Neck: Supple.  Trachea midline Pulmonary:  Good air movement, no use of accessory muscles.  Cardiac: RRR, no JVD Vascular:  Vessel Right Left  Radial Palpable Palpable                          PT Palpable Palpable  DP Palpable Palpable    Musculoskeletal: M/S  5/5 throughout.  No deformity or atrophy. Neurologic:  Sensation grossly intact in extremities.  Symmetrical.  Speech is fluent.  Psychiatric: Judgment intact, Mood & affect appropriate for pt's clinical situation. Dermatologic: No rashes or ulcers noted.  No cellulitis or open wounds.       Labs No results found for this or any previous visit (from the past 2160 hour(s)).  Radiology VAS [place] ABI WITH/WO TBI  Result Date: 04/17/2020 LOWER EXTREMITY DOPPLER STUDY Indications: Leg heaviness bilaterally. Other Factors: Post exercise study to evaluate for claudication.  Performing Technologist: Salvadore Farber RVT  Examination Guidelines: A complete evaluation includes at minimum, Doppler waveform signals and systolic blood pressure reading at the level of bilateral brachial, anterior tibial, and posterior tibial arteries, when vessel segments are accessible. Bilateral testing is considered an integral part of a complete examination. Photoelectric Plethysmograph (PPG) waveforms and toe systolic pressure readings are included as required and additional duplex testing as needed. Limited examinations for reoccurring indications may be performed as noted.  ABI Findings: +--------+------------------+-----+---------+--------+ Right   Rt Pressure (mmHg)IndexWaveform Comment  +--------+------------------+-----+---------+--------+ [id number]                                      +--------+------------------+-----+---------+--------+ ATA     143               1.04 triphasic         +--------+------------------+-----+---------+--------+ PTA     156               1.13 triphasic         +--------+------------------+-----+---------+--------+ +--------+------------------+-----+---------+-------+ Left    Lt Pressure (mmHg)IndexWaveform Comment +--------+------------------+-----+---------+-------+ [id number]                                     +--------+------------------+-----+---------+-------+ ATA     133               0.96 triphasic         +--------+------------------+-----+---------+-------+ PTA     138               1.00 triphasic        +--------+------------------+-----+---------+-------+ +-------+-----------+-----------+------------+------------+ ABI/TBIToday's ABIToday's TBIPrevious ABIPrevious TBI +-------+-----------+-----------+------------+------------+ Right  1.13                  .98                      +-------+-----------+-----------+------------+------------+ Left   1.00                  .91                      +-------+-----------+-----------+------------+------------+ Bilateral ABIs appear increased compared to prior study on 11/18/2019. Post exercise ABI's show normal increase in velocities. Patient experienced no claudication symptoms during exercise.  Summary: Right: Resting right ankle-brachial index is within normal range. No evidence of significant right lower extremity arterial disease. Left: Resting left ankle-brachial index is within normal range. No evidence of significant left lower extremity arterial disease.  *See table(s) above for measurements and observations.  Electronically signed by Festus Barren MD on 04/17/2020 at 10:17:05 AM.   Final     Assessment/Plan HTN (hypertension) blood pressure control important in reducing the progression of atherosclerotic disease. On appropriate oral medications.  Hyperlipidemia lipid control important in reducing the progression of atherosclerotic disease.  PAD (peripheral artery disease) (HCC) His ABIs were in the normal range at 1.1 on the right and 1.0 on the left and had appropriate increase with exercise consistent with no significant arterial insufficiency.  He did have some mild stenosis and plaque on previous duplex studies but nothing that appeared hemodynamically significant.  At this point, an annual follow-up with noninvasive studies would be appropriate unless further problems develop in the interim.    Leotis Pain,  MD  04/17/2020 10:20 AM    This note was created with Dragon medical transcription system.  Any errors from dictation are purely unintentional

## 2020-04-17 NOTE — Assessment & Plan Note (Signed)
His ABIs were in the normal range at 1.1 on the right and 1.0 on the left and had appropriate increase with exercise consistent with no significant arterial insufficiency.  He did have some mild stenosis and plaque on previous duplex studies but nothing that appeared hemodynamically significant.  At this point, an annual follow-up with noninvasive studies would be appropriate unless further problems develop in the interim.

## 2020-10-01 ENCOUNTER — Ambulatory Visit: Payer: Medicare PPO | Admitting: Urology

## 2020-10-10 ENCOUNTER — Other Ambulatory Visit: Payer: Self-pay

## 2020-10-10 ENCOUNTER — Ambulatory Visit: Payer: Medicare PPO | Admitting: Urology

## 2020-10-10 ENCOUNTER — Encounter: Payer: Self-pay | Admitting: Urology

## 2020-10-10 VITALS — BP 112/75 | HR 108 | Ht 68.0 in | Wt 152.0 lb

## 2020-10-10 DIAGNOSIS — N401 Enlarged prostate with lower urinary tract symptoms: Secondary | ICD-10-CM | POA: Diagnosis not present

## 2020-10-10 MED ORDER — FINASTERIDE 5 MG PO TABS: 5.0000 mg | ORAL_TABLET | Freq: Every day | ORAL | 3 refills | Status: DC

## 2020-10-10 MED ORDER — TAMSULOSIN HCL 0.4 MG PO CAPS: 0.4000 mg | ORAL_CAPSULE | Freq: Every day | ORAL | 3 refills | Status: DC

## 2020-10-10 NOTE — Progress Notes (Signed)
   10/10/2020 1:12 PM   James Norman Jr. 05-07-1934 [id number]  Referring provider: Marguarite Arbour, MD [address] [facility] [place],  [place] [address]  Chief Complaint  Patient presents with  . Benign Prostatic Hypertrophy    HPI: 86 y.o. male presents for annual follow-up.   BPH on finasteride/tamsulosin  No changes since last years visit  Denies dysuria or gross hematuria  Denies flank, abdominal or pelvic pain  Does note difficulty voiding with first morning void which subsequently resolves   PMH: Past Medical History:  Diagnosis Date  . Anxiety   . TIA (transient ischemic attack)     Surgical History: Past Surgical History:  Procedure Laterality Date  . HAND SURGERY Left   . HEMORRHOID SURGERY    . TONSILLECTOMY AND ADENOIDECTOMY      Home Medications:  Allergies as of 10/10/2020   No Known Allergies     Medication List       Accurate as of October 10, 2020  1:12 PM. If you have any questions, ask your nurse or doctor.        ALPRAZolam 1 MG tablet Commonly known as: XANAX TAKE 2 TABLETS (2 MG TOTAL) BY MOUTH NIGHTLY AS NEEDED FOR SLEEP   amitriptyline 25 MG tablet Commonly known as: ELAVIL   clopidogrel 75 MG tablet Commonly known as: PLAVIX   finasteride 5 MG tablet Commonly known as: PROSCAR TAKE 1 TABLET EVERY DAY   tamsulosin 0.4 MG Caps capsule Commonly known as: FLOMAX TAKE 1 CAPSULE EVERY DAY       Allergies: No Known Allergies  Family History: Family History  Problem Relation Age of Onset  . Prostate cancer Father     Social History:  reports that he has quit smoking. He quit after 35.00 years of use. He has never used smokeless tobacco. He reports previous alcohol use. He reports that he does not use drugs.   Physical Exam: BP 112/75   Pulse (!) 108   Ht 5\' 8"  (1.727 m)   Wt 152 lb (68.9 kg)   BMI 23.11 kg/m   Constitutional:  Alert and oriented, No acute distress. HEENT: [place] AT,  moist mucus membranes.  Trachea midline, no masses. Cardiovascular: No clubbing, cyanosis, or edema. Respiratory: Normal respiratory effort, no increased work of breathing. Skin: No rashes, bruises or suspicious lesions. Neurologic: Grossly intact, no focal deficits, moving all 4 extremities. Psychiatric: Normal mood and affect.   Assessment & Plan:    1.  BPH with LUTS  Stable on tamsulosin/finasteride which was refilled  Complains of difficulty voiding at first morning void  Discussed with him invasive options including UroLift however he states his symptoms are not bothersome enough that he desires any intervention at this time  Continue annual follow-up   , MD  [facility] [address] [place], [place] [phone]

## 2021-04-16 ENCOUNTER — Encounter (INDEPENDENT_AMBULATORY_CARE_PROVIDER_SITE_OTHER): Payer: Self-pay | Admitting: Vascular Surgery

## 2021-04-16 ENCOUNTER — Ambulatory Visit (INDEPENDENT_AMBULATORY_CARE_PROVIDER_SITE_OTHER): Payer: Medicare PPO | Admitting: Vascular Surgery

## 2021-04-16 ENCOUNTER — Encounter (INDEPENDENT_AMBULATORY_CARE_PROVIDER_SITE_OTHER): Payer: Medicare PPO

## 2021-05-03 ENCOUNTER — Encounter (INDEPENDENT_AMBULATORY_CARE_PROVIDER_SITE_OTHER): Payer: Self-pay

## 2021-05-03 ENCOUNTER — Ambulatory Visit (INDEPENDENT_AMBULATORY_CARE_PROVIDER_SITE_OTHER): Payer: Medicare PPO | Admitting: Vascular Surgery

## 2021-05-03 ENCOUNTER — Encounter (INDEPENDENT_AMBULATORY_CARE_PROVIDER_SITE_OTHER): Payer: Medicare PPO

## 2021-10-10 ENCOUNTER — Other Ambulatory Visit: Payer: Self-pay

## 2021-10-10 ENCOUNTER — Ambulatory Visit: Payer: Medicare PPO | Admitting: Urology

## 2021-10-10 ENCOUNTER — Other Ambulatory Visit: Payer: Self-pay | Admitting: Urology

## 2021-10-10 ENCOUNTER — Encounter: Payer: Self-pay | Admitting: Urology

## 2021-10-10 VITALS — BP 130/74 | HR 84 | Ht 71.0 in | Wt 150.0 lb

## 2021-10-10 DIAGNOSIS — N401 Enlarged prostate with lower urinary tract symptoms: Secondary | ICD-10-CM | POA: Diagnosis not present

## 2021-10-10 LAB — BLADDER SCAN AMB NON-IMAGING: Scan Result: 94

## 2021-10-10 MED ORDER — FINASTERIDE 5 MG PO TABS: 5.0000 mg | ORAL_TABLET | Freq: Every day | ORAL | 3 refills | Status: DC

## 2021-10-10 MED ORDER — TAMSULOSIN HCL 0.4 MG PO CAPS: 0.4000 mg | ORAL_CAPSULE | Freq: Every day | ORAL | 3 refills | Status: DC

## 2021-10-10 NOTE — Progress Notes (Signed)
   10/10/2021 1:36 PM   James Norman Jr. 1934-07-30 [id number]  Referring provider: Marguarite Arbour, MD [address] [facility] [place],  [place] [address]  Chief Complaint  Patient presents with   Benign Prostatic Hypertrophy    Urologic history: 1.  BPH with LUTS Tamsulosin/finasteride   HPI: 87 y.o. presents for annual follow-up.  Doing well since last visit No bothersome LUTS IPSS 5/35 Remains on tamsulosin/finasteride Denies dysuria, gross hematuria Denies flank, abdominal or pelvic pain   PMH: Past Medical History:  Diagnosis Date   Anxiety    TIA (transient ischemic attack)     Surgical History: Past Surgical History:  Procedure Laterality Date   HAND SURGERY Left    HEMORRHOID SURGERY     TONSILLECTOMY AND ADENOIDECTOMY      Home Medications:  Allergies as of 10/10/2021   No Known Allergies      Medication List        Accurate as of October 10, 2021  1:36 PM. If you have any questions, ask your nurse or doctor.          ALPRAZolam 1 MG tablet Commonly known as: XANAX TAKE 2 TABLETS (2 MG TOTAL) BY MOUTH NIGHTLY AS NEEDED FOR SLEEP   amitriptyline 25 MG tablet Commonly known as: ELAVIL   clopidogrel 75 MG tablet Commonly known as: PLAVIX   finasteride 5 MG tablet Commonly known as: PROSCAR Take 1 tablet (5 mg total) by mouth daily.   tamsulosin 0.4 MG Caps capsule Commonly known as: FLOMAX Take 1 capsule (0.4 mg total) by mouth daily.        Allergies: No Known Allergies  Family History: Family History  Problem Relation Age of Onset   Prostate cancer Father     Social History:  reports that he has quit smoking. His smoking use included cigarettes. He has never used smokeless tobacco. He reports that he does not currently use alcohol. He reports that he does not use drugs.   Physical Exam: BP 130/74   Pulse 84   Ht 5\' 11"  (1.803 m)   Wt 150 lb (68 kg)   BMI 20.92 kg/m   Constitutional:   Alert and oriented, No acute distress. HEENT:  AT, moist mucus membranes.  Trachea midline, no masses. Cardiovascular: No clubbing, cyanosis, or edema. Respiratory: Normal respiratory effort, no increased work of breathing. Psychiatric: Normal mood and affect.   Assessment & Plan:    1. Benign localized prostatic hyperplasia with lower urinary tract symptoms (LUTS) Stable voiding symptoms on tamsulosin/finasteride Refills sent to pharmacy Bladder scan PVR today 94 mL Continue annual follow-up or earlier as needed for worsening voiding symptoms   , MD  [facility] [address] [name], [place] [phone]

## 2021-11-20 ENCOUNTER — Encounter: Payer: Self-pay | Admitting: Emergency Medicine

## 2021-11-20 ENCOUNTER — Emergency Department
Admission: EM | Admit: 2021-11-20 | Discharge: 2021-11-20 | Disposition: A | Discharge status: Home / Self Care | Payer: Medicare PPO | Attending: Emergency Medicine | Admitting: Emergency Medicine

## 2021-11-20 ENCOUNTER — Other Ambulatory Visit: Payer: Self-pay

## 2021-11-20 ENCOUNTER — Emergency Department: Payer: Medicare PPO

## 2021-11-20 DIAGNOSIS — Z20822 Contact with and (suspected) exposure to covid-19: Secondary | ICD-10-CM | POA: Insufficient documentation

## 2021-11-20 DIAGNOSIS — R42 Dizziness and giddiness: Secondary | ICD-10-CM | POA: Insufficient documentation

## 2021-11-20 DIAGNOSIS — Z5321 Procedure and treatment not carried out due to patient leaving prior to being seen by health care provider: Secondary | ICD-10-CM | POA: Insufficient documentation

## 2021-11-20 DIAGNOSIS — R531 Weakness: Secondary | ICD-10-CM | POA: Diagnosis present

## 2021-11-20 LAB — URINALYSIS, ROUTINE W REFLEX MICROSCOPIC
Bacteria, UA: NONE SEEN
Bilirubin Urine: NEGATIVE
Glucose, UA: NEGATIVE mg/dL
Hgb urine dipstick: NEGATIVE
Ketones, ur: NEGATIVE mg/dL
Leukocytes,Ua: NEGATIVE
Nitrite: NEGATIVE
Protein, ur: NEGATIVE mg/dL
Specific Gravity, Urine: 1.015 (ref 1.005–1.030)
Squamous Epithelial / HPF: NONE SEEN (ref 0–5)
WBC, UA: NONE SEEN WBC/hpf (ref 0–5)
pH: 7 (ref 5.0–8.0)

## 2021-11-20 LAB — BASIC METABOLIC PANEL
Anion gap: 7 (ref 5–15)
BUN: 24 mg/dL — ABNORMAL HIGH (ref 8–23)
CO2: 25 mmol/L (ref 22–32)
Calcium: 9.2 mg/dL (ref 8.9–10.3)
Chloride: 105 mmol/L (ref 98–111)
Creatinine, Ser: 0.9 mg/dL (ref 0.61–1.24)
GFR, Estimated: >60 mL/min (ref >60)
Glucose, Bld: 103 mg/dL — ABNORMAL HIGH (ref 70–99)
Potassium: 4.2 mmol/L (ref 3.5–5.1)
Sodium: 137 mmol/L (ref 135–145)

## 2021-11-20 LAB — CBC
HCT: 41 % (ref 39.0–52.0)
Hemoglobin: 13.6 g/dL (ref 13.0–17.0)
MCH: 30.4 pg (ref 26.0–34.0)
MCHC: 33.2 g/dL (ref 30.0–36.0)
MCV: 91.5 fL (ref 80.0–100.0)
Platelets: 170 10*3/uL (ref 150–400)
RBC: 4.48 MIL/uL (ref 4.22–5.81)
RDW: 13.2 % (ref 11.5–15.5)
WBC: 5.3 10*3/uL (ref 4.0–10.5)
nRBC: 0 % (ref 0.0–0.2)

## 2021-11-20 LAB — RESP PANEL BY RT-PCR (FLU A&B, COVID) ARPGX2
Influenza A by PCR: NEGATIVE
Influenza B by PCR: NEGATIVE
SARS Coronavirus 2 by RT PCR: NEGATIVE

## 2021-11-20 NOTE — ED Provider Notes (Signed)
  Emergency Medicine Provider Triage Evaluation Note  James [name]. , a 87 y.o.male,  was evaluated in triage.  Pt complains of weakness.  Patient states that he has been feeling increasingly more weak over the past few days.  Reports some discomfort in his feet bilaterally, lightheadedness, and urinary incontinence that occurred yesterday.  Otherwise no remarkable symptoms.   Review of Systems  Positive: Weakness, lightheadedness, discomfort in feet. Negative: Denies fever, chest pain, vomiting  Physical Exam   Vitals:   11/20/21 1512  BP: 117/82  Pulse: 95  Resp: 18  Temp: 98 F (36.7 C)  SpO2: 98%   Gen:   Awake, no distress   Resp:  Normal effort  MSK:   Moves extremities without difficulty  Other:    Medical Decision Making  Given the patient's initial medical screening exam, the following diagnostic evaluation has been ordered. The patient will be placed in the appropriate treatment space, once one is available, to complete the evaluation and treatment. I have discussed the plan of care with the patient and I have advised the patient that an ED physician or mid-level practitioner will reevaluate their condition after the test results have been received, as the results may give them additional insight into the type of treatment they may need.    Diagnostics: Labs, EKG, respiratory panel, CXR  Treatments: none immediately   Varney Daily, PA 11/20/21 1517    Chesley Noon, MD 11/20/21 2031

## 2021-11-20 NOTE — ED Triage Notes (Signed)
Pt comes via [facility] with c/o weakness that has been ongoing. Family reports this has been study decline over last week. Pt has not been eating. Pt states some lightheadedness.  BP-130/80 HR-84 O2-95% RA T-98.7 CBG-108

## 2022-08-28 ENCOUNTER — Other Ambulatory Visit: Payer: Self-pay | Admitting: Urology

## 2022-10-10 ENCOUNTER — Ambulatory Visit: Payer: Medicare PPO | Admitting: Urology

## 2022-10-13 ENCOUNTER — Encounter (INDEPENDENT_AMBULATORY_CARE_PROVIDER_SITE_OTHER): Payer: Self-pay

## 2022-11-09 ENCOUNTER — Other Ambulatory Visit: Payer: Self-pay | Admitting: Urology

## 2023-03-28 IMAGING — CR DG CHEST 2V
1 series · 2 of 2 positions shown · non-contrast
Comparison: Chest radiograph 04/24/2018

CLINICAL DATA: Weakness

EXAM:
CHEST - 2 VIEW

[Series 1: dg chest 2 view · 0.14mm/px · 2 of 2 slices shown]
[im 1/2]
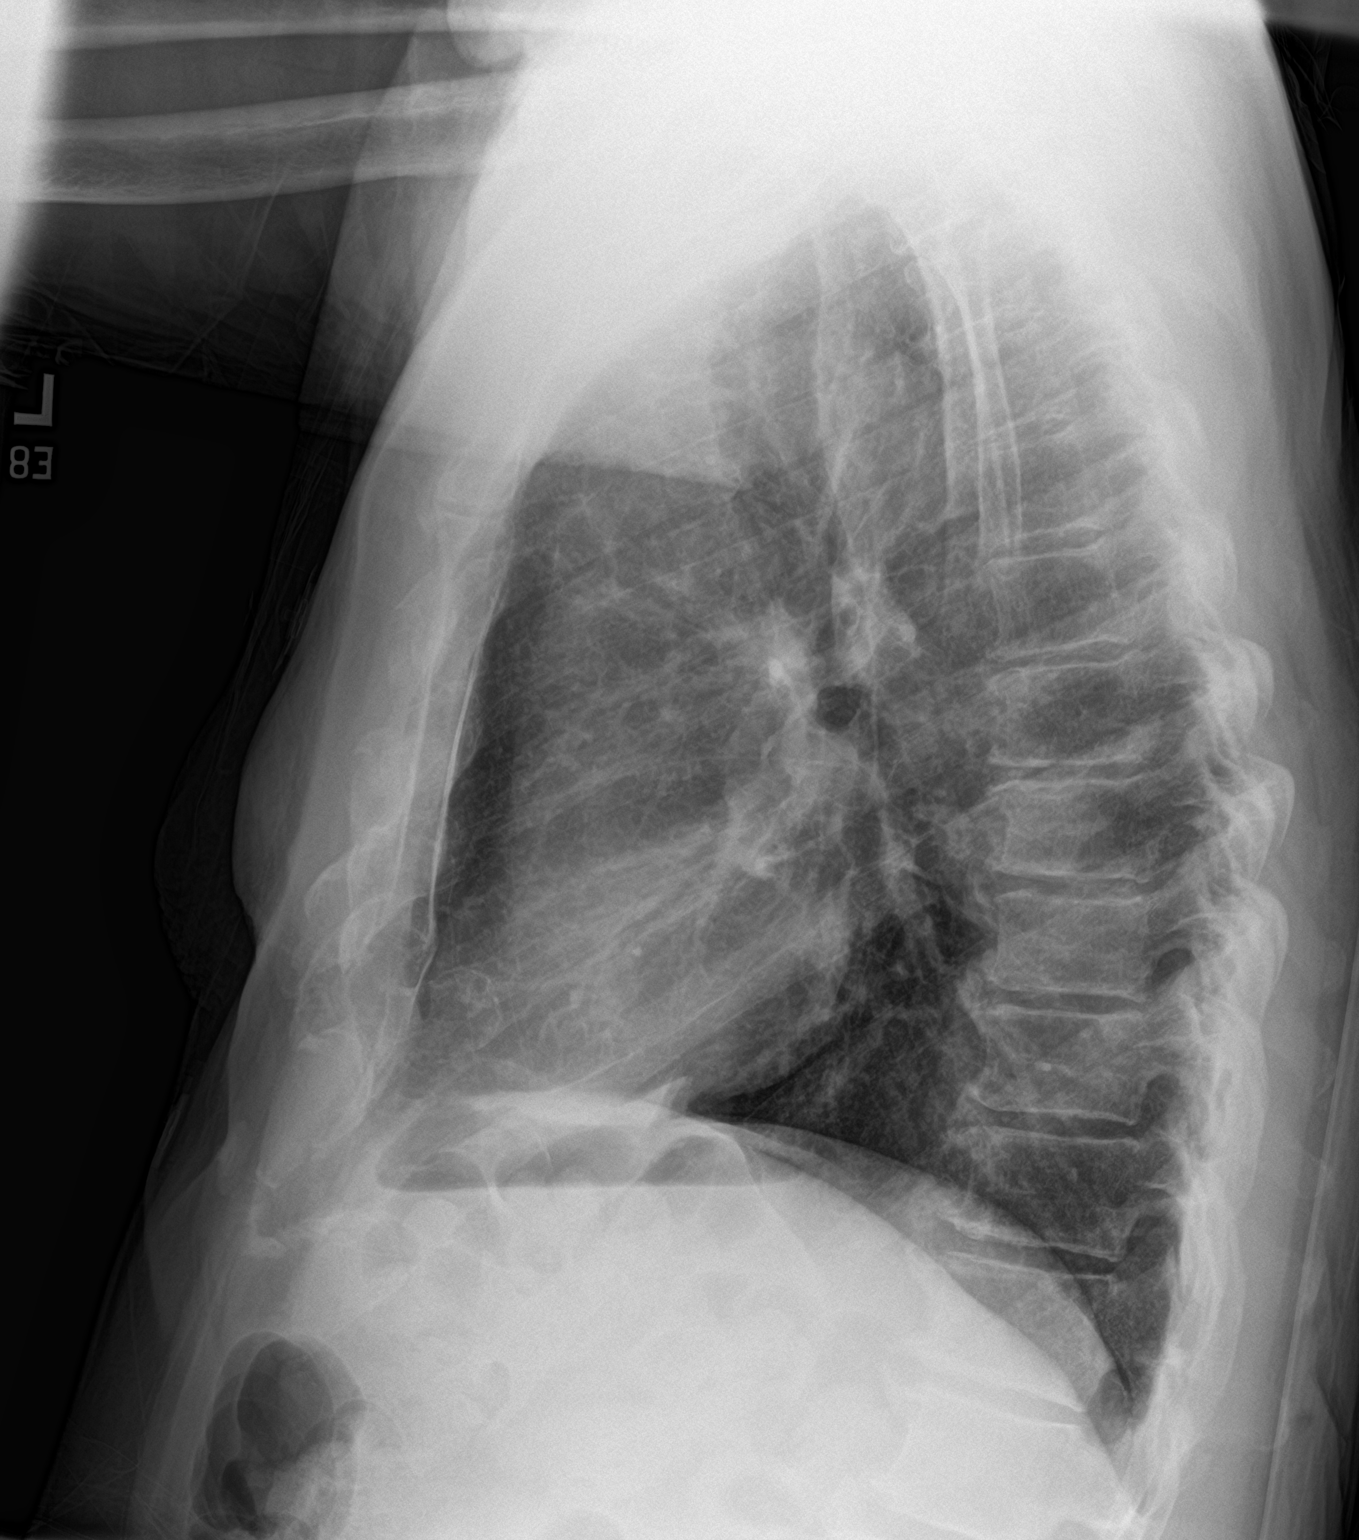
[im 2/2]
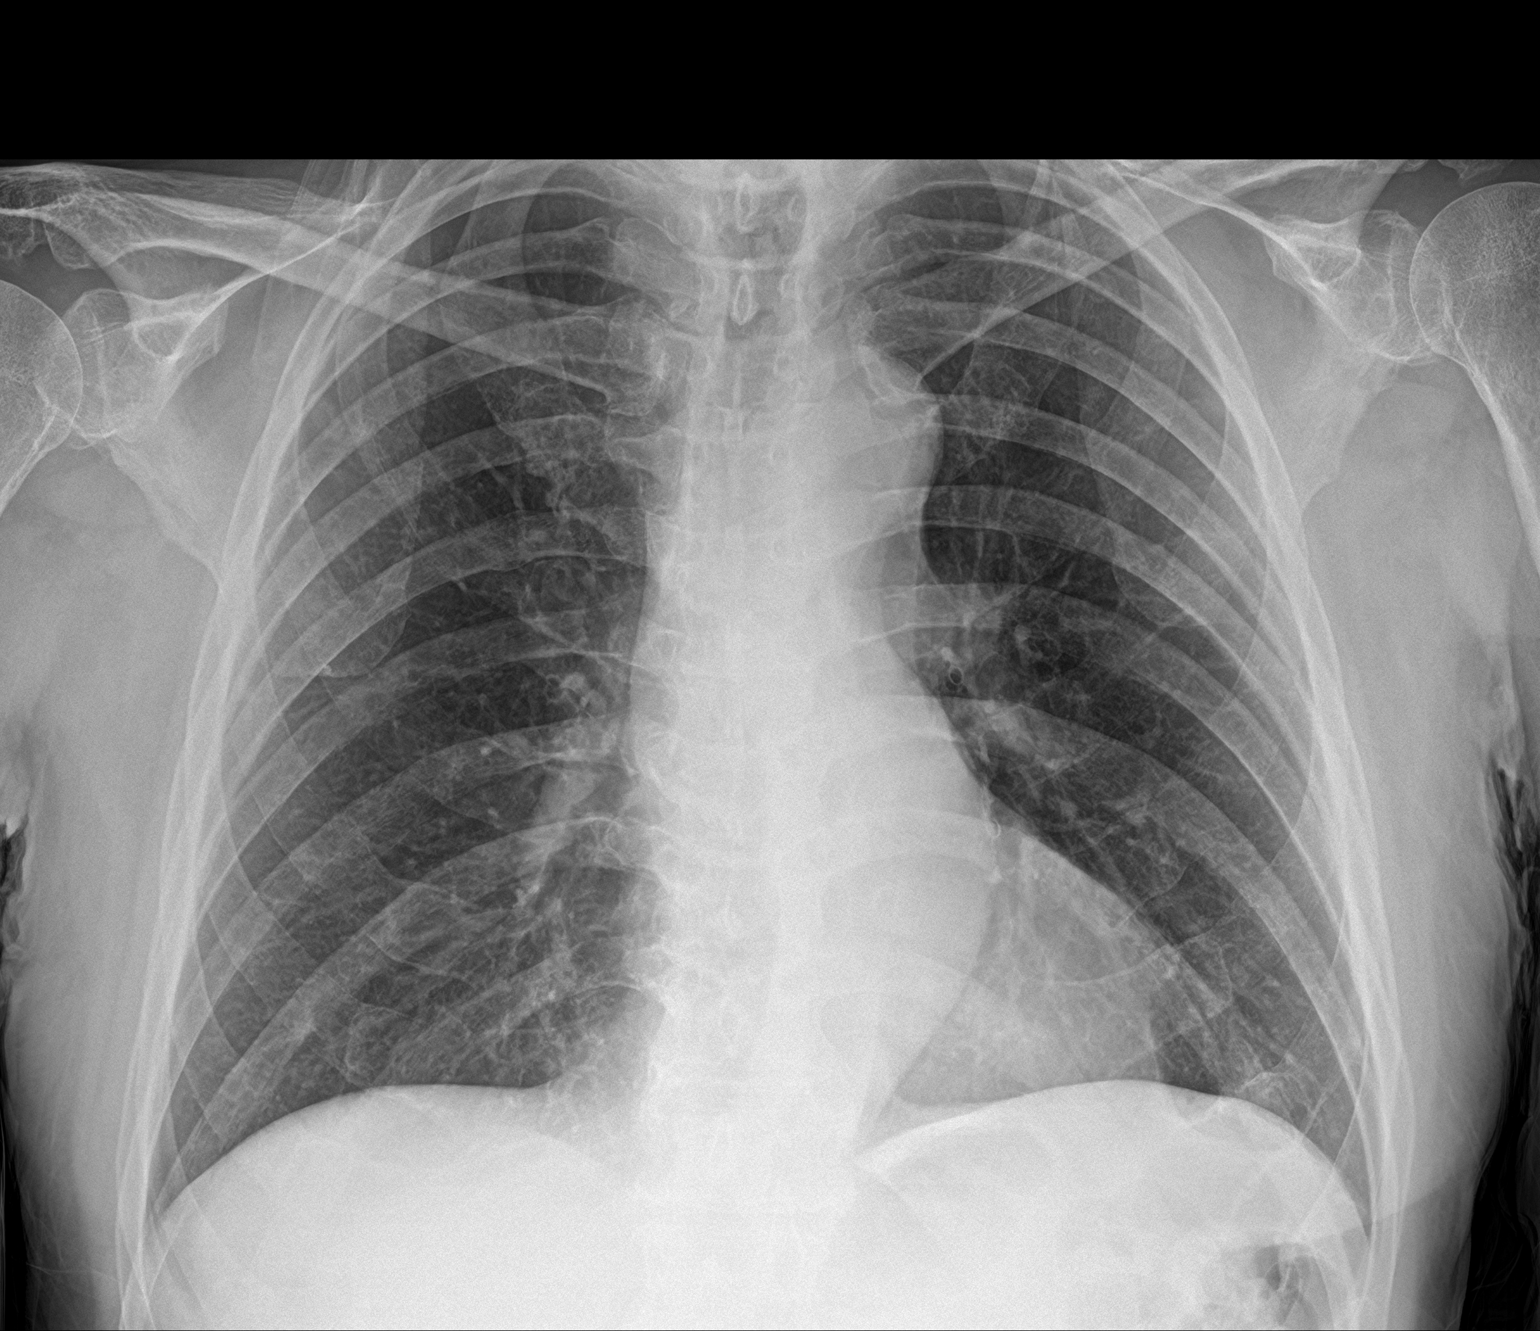

[2 of 2 positions shown; findings below may reference images not displayed]

FINDINGS: The cardiomediastinal silhouette is normal.

There is no focal consolidation or pulmonary edema. There is no
pleural effusion or pneumothorax

There is no acute osseous abnormality.
IMPRESSION: No radiographic evidence of acute cardiopulmonary process.

## 2023-05-09 ENCOUNTER — Emergency Department: Payer: Medicare Other

## 2023-05-09 ENCOUNTER — Emergency Department
Admission: EM | Admit: 2023-05-09 | Discharge: 2023-05-10 | Disposition: A | Discharge status: Home / Self Care | Payer: Medicare Other | Attending: Emergency Medicine | Admitting: Emergency Medicine

## 2023-05-09 ENCOUNTER — Other Ambulatory Visit: Payer: Self-pay

## 2023-05-09 DIAGNOSIS — Z79899 Other long term (current) drug therapy: Secondary | ICD-10-CM | POA: Diagnosis not present

## 2023-05-09 DIAGNOSIS — I10 Essential (primary) hypertension: Secondary | ICD-10-CM | POA: Diagnosis not present

## 2023-05-09 DIAGNOSIS — R531 Weakness: Secondary | ICD-10-CM | POA: Insufficient documentation

## 2023-05-09 DIAGNOSIS — R339 Retention of urine, unspecified: Secondary | ICD-10-CM | POA: Diagnosis not present

## 2023-05-09 DIAGNOSIS — R296 Repeated falls: Secondary | ICD-10-CM | POA: Diagnosis not present

## 2023-05-09 DIAGNOSIS — Z8673 Personal history of transient ischemic attack (TIA), and cerebral infarction without residual deficits: Secondary | ICD-10-CM | POA: Insufficient documentation

## 2023-05-09 DIAGNOSIS — Z1152 Encounter for screening for COVID-19: Secondary | ICD-10-CM | POA: Diagnosis not present

## 2023-05-09 LAB — URINALYSIS, W/ REFLEX TO CULTURE (INFECTION SUSPECTED)
Bacteria, UA: NONE SEEN
Bilirubin Urine: NEGATIVE
Glucose, UA: NEGATIVE mg/dL
Hgb urine dipstick: NEGATIVE
Ketones, ur: NEGATIVE mg/dL
Leukocytes,Ua: NEGATIVE
Nitrite: NEGATIVE
Protein, ur: NEGATIVE mg/dL
Specific Gravity, Urine: 1.019 (ref 1.005–1.030)
pH: 6 (ref 5.0–8.0)

## 2023-05-09 LAB — CBC WITH DIFFERENTIAL/PLATELET
Abs Immature Granulocytes: 0.01 10*3/uL (ref 0.00–0.07)
Basophils Absolute: 0.1 10*3/uL (ref 0.0–0.1)
Basophils Relative: 2 %
Eosinophils Absolute: 0.4 10*3/uL (ref 0.0–0.5)
Eosinophils Relative: 9 %
HCT: 38.5 % — ABNORMAL LOW (ref 39.0–52.0)
Hemoglobin: 12.5 g/dL — ABNORMAL LOW (ref 13.0–17.0)
Immature Granulocytes: 0 %
Lymphocytes Relative: 27 %
Lymphs Abs: 1.2 10*3/uL (ref 0.7–4.0)
MCH: 29.6 pg (ref 26.0–34.0)
MCHC: 32.5 g/dL (ref 30.0–36.0)
MCV: 91.2 fL (ref 80.0–100.0)
Monocytes Absolute: 0.6 10*3/uL (ref 0.1–1.0)
Monocytes Relative: 13 %
Neutro Abs: 2.2 10*3/uL (ref 1.7–7.7)
Neutrophils Relative %: 49 %
Platelets: 166 10*3/uL (ref 150–400)
RBC: 4.22 MIL/uL (ref 4.22–5.81)
RDW: 13.2 % (ref 11.5–15.5)
WBC: 4.5 10*3/uL (ref 4.0–10.5)
nRBC: 0 % (ref 0.0–0.2)

## 2023-05-09 LAB — COMPREHENSIVE METABOLIC PANEL
ALT: 17 U/L (ref 0–44)
AST: 22 U/L (ref 15–41)
Albumin: 3.6 g/dL (ref 3.5–5.0)
Alkaline Phosphatase: 65 U/L (ref 38–126)
Anion gap: 6 (ref 5–15)
BUN: 27 mg/dL — ABNORMAL HIGH (ref 8–23)
CO2: 27 mmol/L (ref 22–32)
Calcium: 8.4 mg/dL — ABNORMAL LOW (ref 8.9–10.3)
Chloride: 104 mmol/L (ref 98–111)
Creatinine, Ser: 1.25 mg/dL — ABNORMAL HIGH (ref 0.61–1.24)
GFR, Estimated: 56 mL/min — ABNORMAL LOW (ref >60)
Glucose, Bld: 100 mg/dL — ABNORMAL HIGH (ref 70–99)
Potassium: 4 mmol/L (ref 3.5–5.1)
Sodium: 137 mmol/L (ref 135–145)
Total Bilirubin: 0.6 mg/dL (ref 0.3–1.2)
Total Protein: 6.8 g/dL (ref 6.5–8.1)

## 2023-05-09 LAB — TROPONIN I (HIGH SENSITIVITY)
Troponin I (High Sensitivity): 4 ng/L (ref <18)
Troponin I (High Sensitivity): 4 ng/L (ref <18)

## 2023-05-09 LAB — ETHANOL: Alcohol, Ethyl (B): <10 mg/dL (ref <10)

## 2023-05-09 LAB — SARS CORONAVIRUS 2 BY RT PCR: SARS Coronavirus 2 by RT PCR: NEGATIVE

## 2023-05-09 LAB — T4, FREE: Free T4: 0.81 ng/dL (ref 0.61–1.12)

## 2023-05-09 LAB — LACTIC ACID, PLASMA: Lactic Acid, Venous: 1.3 mmol/L (ref 0.5–1.9)

## 2023-05-09 LAB — MAGNESIUM: Magnesium: 2.1 mg/dL (ref 1.7–2.4)

## 2023-05-09 LAB — TSH: TSH: 0.814 u[IU]/mL (ref 0.350–4.500)

## 2023-05-09 MED ORDER — SODIUM CHLORIDE 0.9 % IV BOLUS
500.0000 mL | Freq: Once | INTRAVENOUS | Status: AC
  Administered 2023-05-09: 500 mL via INTRAVENOUS

## 2023-05-09 NOTE — Discharge Instructions (Addendum)
Call your primary doctor for a follow-up appointment next week.  Call urologist either Dr. Apolinar Junes or Dr. Arita Miss for follow-up appointment for urinary retention.  If you experience any new, worsening, or expected symptoms call your doctor right away or return to the emergency department.

## 2023-05-09 NOTE — ED Notes (Signed)
ED Provider at bedside. 

## 2023-05-09 NOTE — ED Triage Notes (Signed)
Pt arrives via EMS from home w.c.o falls x 2 today and fatigue x 2 weeks. Pt denies blood thinners or LOC or hitting head with falls. Pt denies CP or SOB. Pt is A/O x 4. BEFAST negative.

## 2023-05-09 NOTE — ED Provider Notes (Signed)
[facility] Provider Note    Event Date/Time   First MD Initiated Contact with Patient 05/09/23 1919     (approximate)   History   Weakness and Fatigue (Pt arrives via EMS from home w.c.o falls x 2 today and fatigue x 2 weeks. Pt denies blood thinners or LOC or hitting head with falls. Pt denies CP or SOB. Pt is A/O x 4. BEFAST negative)   HPI  James [name]. is a 89 y.o. male   Past medical history of TIA, anxiety, BPH with LUTS, hypertension, hyperlipidemia, kidney stone, polyneuropathy with frequent falls who presents emergency department with frequent falls neurolysed weakness.  He has since some subacute generalized weakness has been ongoing for weeks/months and a steady decline over the last 1 week, poor p.o. intake  Had couple falls today without any head strike or loss of consciousness does not take blood thinners.  He feels just so weak that he gives out while walking with his walker.  He denies any significant trauma.  He denies any pain anywhere and denies any acute medical complaints or focal infectious symptoms like respiratory infectious symptoms, GI or GU complaints.  He does take multiple medications that are sedating and shows me his medications that he brought with him today including Xanax, quetiapine, trazodone   External Medical Documents Reviewed: Dr. Sandi Carne internal medicine office visit note from December 2023 documenting his weakness, frequent falls at home, generalized weakness thought may be related to his Xanax prescription, at that time recommended cutting back his Xanax to 1 mg nightly as needed      Physical Exam   Triage Vital Signs: ED Triage Vitals  Enc Vitals Group     BP      Pulse      Resp      Temp      Temp src      SpO2      Weight      Height      Head Circumference      Peak Flow      Pain Score      Pain Loc      Pain Edu?      Excl. in GC?     Most recent vital signs: Vitals:    05/09/23 2115 05/09/23 2215  BP: (!) 140/76 119/68  Pulse: 63 61  Resp: 18   Temp:    SpO2: 97% 99%    General: Awake, no distress.  CV:  Good peripheral perfusion.  Resp:  Normal effort.  Abd:  No distention.  Other:  Awake alert comfortable appearing with normal vital signs, no respiratory distress.  No focal neurologic deficits including facial asymmetry, dysarthria, finger-to-nose testing, motor or sensory deficits, aside from chronic numbness of the feet associated with his polyneuropathy, unchanged.  Soft nontender abdomen clear lungs.  Skin appears warm well-perfused.   ED Results / Procedures / Treatments   Labs (all labs ordered are listed, but only abnormal results are displayed) Labs Reviewed  COMPREHENSIVE METABOLIC PANEL - Abnormal; Notable for the following components:      Result Value   Glucose, Bld 100 (*)    BUN 27 (*)    Creatinine, Ser 1.25 (*)    Calcium 8.4 (*)    GFR, Estimated 56 (*)    All other components within normal limits  CBC WITH DIFFERENTIAL/PLATELET - Abnormal; Notable for the following components:   Hemoglobin 12.5 (*)    HCT 38.5 (*)  All other components within normal limits  URINALYSIS, W/ REFLEX TO CULTURE (INFECTION SUSPECTED) - Abnormal; Notable for the following components:   Color, Urine YELLOW (*)    APPearance CLEAR (*)    All other components within normal limits  SARS CORONAVIRUS 2 BY RT PCR  ETHANOL  LACTIC ACID, PLASMA  MAGNESIUM  TSH  T4, FREE  TROPONIN I (HIGH SENSITIVITY)  TROPONIN I (HIGH SENSITIVITY)     I ordered and reviewed the above labs they are notable for no signs of urinary tract infection, troponins flat  EKG  ED ECG REPORT I, Pilar Jarvis, the attending physician, personally viewed and interpreted this ECG.   Date: 05/09/2023  EKG Time: 1924  Rate: 93  Rhythm: sinus  Axis: nl  Intervals: none  ST&T Change: no stemi    RADIOLOGY I independently reviewed and interpreted cxr and see no  obvious focality or pneumothorax   PROCEDURES:  Critical Care performed: No  Procedures   MEDICATIONS ORDERED IN ED: Medications  sodium chloride 0.9 % bolus 500 mL (0 mLs Intravenous Stopped 05/09/23 2128)    IMPRESSION / MDM / ASSESSMENT AND PLAN / ED COURSE  I reviewed the triage vital signs and the nursing notes.                                Patient's presentation is most consistent with acute presentation with potential threat to life or bodily function.  Differential diagnosis includes, but is not limited to, infection, ACS, electrolyte disturbance, dehydration, adverse effect of medication and benzodiazepines, withdrawal, deconditioning, intracranial bleeding CVA   The patient is on the cardiac monitor to evaluate for evidence of arrhythmia and/or significant heart rate changes.  MDM: This is a patient with what appears to be chronic generalized weakness and frequent falls dating back to December 2023 when he seen his primary doctor for the same.  Conditioning seems to be worsening per family reports.  He had a couple of falls today with no significant injury.  CT of the head to rule out intracranial bleeding.  Get basic labs to check electrolytes, thyroid studies, infection workup including chest x-ray and urinalysis.  No focal neurologic deficits to suggest a stroke and he is well outside the timeline for thrombolytics in any case.  May be due to deconditioning given his age or polypharmacy given his multiple sedating medications.  If workup is unremarkable patient remains asymptomatic, plan will be for discharge home and close PMD follow-up for further evaluation of his symptoms. --  Workup unremarkable and patient stable.  His postvoid residual was found to be around 500 cc so a Foley catheter was placed for urinary retention.  No signs of infection.  I spoke with the patient and daughter about findings today which did not reveal any emergent cause of his chronic  generalized weakness and frequent falls.  He will follow-up with his primary doctor this week for checkup and return with any worsening.  I gave him contact information for urologist given his acute urinary retention for voiding trial, further workup.       FINAL CLINICAL IMPRESSION(S) / ED DIAGNOSES   Final diagnoses:  Generalized weakness  Frequent falls  Urinary retention     Rx / DC Orders   ED Discharge Orders     None        Note:  This document was prepared using Dragon voice recognition software and may include  unintentional dictation errors.    Pilar Jarvis, MD 05/09/23 [phone]

## 2023-05-10 NOTE — ED Notes (Signed)
Catheter education provided to pt and pt's son.

## 2023-05-18 ENCOUNTER — Ambulatory Visit: Payer: Medicare Other | Admitting: Physician Assistant

## 2023-05-18 ENCOUNTER — Ambulatory Visit (INDEPENDENT_AMBULATORY_CARE_PROVIDER_SITE_OTHER): Payer: Medicare Other | Admitting: Physician Assistant

## 2023-05-18 VITALS — BP 138/84 | HR 102 | Ht 67.0 in | Wt 160.0 lb

## 2023-05-18 DIAGNOSIS — N401 Enlarged prostate with lower urinary tract symptoms: Secondary | ICD-10-CM

## 2023-05-18 DIAGNOSIS — R339 Retention of urine, unspecified: Secondary | ICD-10-CM

## 2023-05-18 LAB — BLADDER SCAN AMB NON-IMAGING: Scan Result: 0

## 2023-05-18 NOTE — Progress Notes (Signed)
Catheter Removal  Patient is present today for a catheter removal.  9ml of water was drained from the balloon. A 14 FR foley cath was removed from the bladder, no complications were noted. Patient tolerated well.  Performed by: August Luz, RN   Follow up/ Additional notes: 3:30 this afternoon for voiding trial

## 2023-05-18 NOTE — Progress Notes (Signed)
05/18/2023 3:54 PM   James [place] Jr. Sep 07, 1934 [id number]  CC: Chief Complaint  Patient presents with   Urinary Retention   HPI: James [name]. is a 89 y.o. male with PMH BPH on Flomax and finasteride who presents today for voiding trial.  He is accompanied today by his son, Thayer Ohm, who contributes to HPI.  He was seen in the emergency department on 05/09/2023 with reports of generalized weakness and frequent falls.  Family suspected this was due to a mixup with his medications with him taking more Xanax than intended.  While he was in the ED he was prompted to void but was unable to do so and a Foley catheter was placed with bladder scan showing 500 cc.  UA was bland.  Foley removed in the morning, see separate procedure note for details.  He returned to clinic in the afternoon.  He has been able to void without difficulty.  No history of urinary retention.  He feels that his medications have been sorted out.  His son wonders if he was truly in urinary retention at the time of ED visit or if he just had a shy bladder and was unable to void on demand.  PMH: Past Medical History:  Diagnosis Date   Anxiety    TIA (transient ischemic attack)     Surgical History: Past Surgical History:  Procedure Laterality Date   HAND SURGERY Left    HEMORRHOID SURGERY     TONSILLECTOMY AND ADENOIDECTOMY      Home Medications:  Allergies as of 05/18/2023   Not on File      Medication List        Accurate as of May 18, 2023  3:54 PM. If you have any questions, ask your nurse or doctor.          ALPRAZolam 1 MG tablet Commonly known as: XANAX TAKE 2 TABLETS (2 MG TOTAL) BY MOUTH NIGHTLY AS NEEDED FOR SLEEP   amitriptyline 25 MG tablet Commonly known as: ELAVIL   clopidogrel 75 MG tablet Commonly known as: PLAVIX   finasteride 5 MG tablet Commonly known as: PROSCAR Take 1 tablet (5 mg total) by mouth daily.   tamsulosin 0.4 MG Caps capsule Commonly known as:  FLOMAX TAKE 1 CAPSULE EVERY DAY        Allergies:  Not on File  Family History: Family History  Problem Relation Age of Onset   Prostate cancer Father     Social History:   reports that he has quit smoking. His smoking use included cigarettes. He has never used smokeless tobacco. He reports that he does not currently use alcohol. He reports that he does not use drugs.  Physical Exam: There were no vitals taken for this visit.  Constitutional:  Alert and oriented, no acute distress, nontoxic appearing HEENT: [place], AT Cardiovascular: No clubbing, cyanosis, or edema Respiratory: Normal respiratory effort, no increased work of breathing Skin: No rashes, bruises or suspicious lesions Neurologic: Grossly intact, no focal deficits, moving all 4 extremities Psychiatric: Normal mood and affect  Laboratory Data: Results for orders placed or performed in visit on 05/18/23  Bladder Scan (Post Void Residual) in office  Result Value Ref Range   Scan Result 0    Assessment & Plan:   1. Urinary retention Unclear if shy bladder versus true urinary retention during ED visit.  Regardless, voiding trial passed today.  Will continue Flomax and finasteride.  Medication issues have resolved.  I offered him  repeat bladder scan in 1 month versus follow-up as needed and he prefers the latter, which is reasonable. - Bladder Scan (Post Void Residual) in office  Return if symptoms worsen or fail to improve.  Carman Ching, PA-C  [facility] [address], [address] [place], [place] [address] [phone]

## 2023-09-02 ENCOUNTER — Other Ambulatory Visit: Payer: Self-pay

## 2023-09-02 ENCOUNTER — Observation Stay: Payer: Medicare Other

## 2023-09-02 ENCOUNTER — Emergency Department: Payer: Medicare Other

## 2023-09-02 ENCOUNTER — Inpatient Hospital Stay
Admission: EM | Admit: 2023-09-02 | Discharge: 2023-09-08 | DRG: 065 | Disposition: A | Discharge status: Rehabilitation Facility | Payer: Medicare Other | Attending: Internal Medicine | Admitting: Internal Medicine

## 2023-09-02 DIAGNOSIS — N401 Enlarged prostate with lower urinary tract symptoms: Secondary | ICD-10-CM | POA: Diagnosis present

## 2023-09-02 DIAGNOSIS — Z1152 Encounter for screening for COVID-19: Secondary | ICD-10-CM

## 2023-09-02 DIAGNOSIS — Z87891 Personal history of nicotine dependence: Secondary | ICD-10-CM

## 2023-09-02 DIAGNOSIS — I7122 Aneurysm of the aortic arch, without rupture: Secondary | ICD-10-CM | POA: Diagnosis not present

## 2023-09-02 DIAGNOSIS — I6381 Other cerebral infarction due to occlusion or stenosis of small artery: Secondary | ICD-10-CM | POA: Diagnosis present

## 2023-09-02 DIAGNOSIS — Z8042 Family history of malignant neoplasm of prostate: Secondary | ICD-10-CM

## 2023-09-02 DIAGNOSIS — Z7982 Long term (current) use of aspirin: Secondary | ICD-10-CM

## 2023-09-02 DIAGNOSIS — G629 Polyneuropathy, unspecified: Secondary | ICD-10-CM | POA: Diagnosis present

## 2023-09-02 DIAGNOSIS — E785 Hyperlipidemia, unspecified: Secondary | ICD-10-CM | POA: Diagnosis present

## 2023-09-02 DIAGNOSIS — I639 Cerebral infarction, unspecified: Secondary | ICD-10-CM | POA: Diagnosis present

## 2023-09-02 DIAGNOSIS — K219 Gastro-esophageal reflux disease without esophagitis: Secondary | ICD-10-CM | POA: Diagnosis present

## 2023-09-02 DIAGNOSIS — F419 Anxiety disorder, unspecified: Secondary | ICD-10-CM | POA: Diagnosis present

## 2023-09-02 DIAGNOSIS — G8194 Hemiplegia, unspecified affecting left nondominant side: Secondary | ICD-10-CM | POA: Diagnosis present

## 2023-09-02 DIAGNOSIS — I10 Essential (primary) hypertension: Secondary | ICD-10-CM | POA: Diagnosis present

## 2023-09-02 DIAGNOSIS — I739 Peripheral vascular disease, unspecified: Secondary | ICD-10-CM | POA: Diagnosis present

## 2023-09-02 DIAGNOSIS — Z66 Do not resuscitate: Secondary | ICD-10-CM | POA: Diagnosis present

## 2023-09-02 DIAGNOSIS — N39498 Other specified urinary incontinence: Secondary | ICD-10-CM | POA: Diagnosis not present

## 2023-09-02 DIAGNOSIS — Z8673 Personal history of transient ischemic attack (TIA), and cerebral infarction without residual deficits: Secondary | ICD-10-CM

## 2023-09-02 DIAGNOSIS — Z7902 Long term (current) use of antithrombotics/antiplatelets: Secondary | ICD-10-CM

## 2023-09-02 DIAGNOSIS — I63431 Cerebral infarction due to embolism of right posterior cerebral artery: Principal | ICD-10-CM | POA: Diagnosis present

## 2023-09-02 DIAGNOSIS — Z79899 Other long term (current) drug therapy: Secondary | ICD-10-CM

## 2023-09-02 DIAGNOSIS — R29706 NIHSS score 6: Secondary | ICD-10-CM | POA: Diagnosis present

## 2023-09-02 DIAGNOSIS — W19XXXA Unspecified fall, initial encounter: Principal | ICD-10-CM

## 2023-09-02 LAB — CBC WITH DIFFERENTIAL/PLATELET
Abs Immature Granulocytes: 0.01 10*3/uL (ref 0.00–0.07)
Basophils Absolute: 0.1 10*3/uL (ref 0.0–0.1)
Basophils Relative: 1 %
Eosinophils Absolute: 0.1 10*3/uL (ref 0.0–0.5)
Eosinophils Relative: 2 %
HCT: 46.1 % (ref 39.0–52.0)
Hemoglobin: 15 g/dL (ref 13.0–17.0)
Immature Granulocytes: 0 %
Lymphocytes Relative: 20 %
Lymphs Abs: 1.1 10*3/uL (ref 0.7–4.0)
MCH: 29.8 pg (ref 26.0–34.0)
MCHC: 32.5 g/dL (ref 30.0–36.0)
MCV: 91.7 fL (ref 80.0–100.0)
Monocytes Absolute: 0.5 10*3/uL (ref 0.1–1.0)
Monocytes Relative: 8 %
Neutro Abs: 4 10*3/uL (ref 1.7–7.7)
Neutrophils Relative %: 69 %
Platelets: 170 10*3/uL (ref 150–400)
RBC: 5.03 MIL/uL (ref 4.22–5.81)
RDW: 13.2 % (ref 11.5–15.5)
WBC: 5.8 10*3/uL (ref 4.0–10.5)
nRBC: 0 % (ref 0.0–0.2)

## 2023-09-02 LAB — RESP PANEL BY RT-PCR (RSV, FLU A&B, COVID)  RVPGX2
Influenza A by PCR: NEGATIVE
Influenza B by PCR: NEGATIVE
Resp Syncytial Virus by PCR: NEGATIVE
SARS Coronavirus 2 by RT PCR: NEGATIVE

## 2023-09-02 LAB — COMPREHENSIVE METABOLIC PANEL WITH GFR
ALT: 17 U/L (ref 0–44)
AST: 25 U/L (ref 15–41)
Albumin: 4.3 g/dL (ref 3.5–5.0)
Alkaline Phosphatase: 74 U/L (ref 38–126)
Anion gap: 10 (ref 5–15)
BUN: 24 mg/dL — ABNORMAL HIGH (ref 8–23)
CO2: 24 mmol/L (ref 22–32)
Calcium: 9.4 mg/dL (ref 8.9–10.3)
Chloride: 104 mmol/L (ref 98–111)
Creatinine, Ser: 1.31 mg/dL — ABNORMAL HIGH (ref 0.61–1.24)
GFR, Estimated: 53 mL/min — ABNORMAL LOW (ref >60)
Glucose, Bld: 107 mg/dL — ABNORMAL HIGH (ref 70–99)
Potassium: 3.9 mmol/L (ref 3.5–5.1)
Sodium: 138 mmol/L (ref 135–145)
Total Bilirubin: 1.1 mg/dL (ref 0.3–1.2)
Total Protein: 9 g/dL — ABNORMAL HIGH (ref 6.5–8.1)

## 2023-09-02 LAB — URINALYSIS, COMPLETE (UACMP) WITH MICROSCOPIC
Bilirubin Urine: NEGATIVE
Glucose, UA: NEGATIVE mg/dL
Ketones, ur: 5 mg/dL — AB
Leukocytes,Ua: NEGATIVE
Nitrite: NEGATIVE
Protein, ur: NEGATIVE mg/dL
Specific Gravity, Urine: 1.02 (ref 1.005–1.030)
pH: 5 (ref 5.0–8.0)

## 2023-09-02 LAB — PROTIME-INR
INR: 1.1 (ref 0.8–1.2)
Prothrombin Time: 14.5 s (ref 11.4–15.2)

## 2023-09-02 LAB — TROPONIN I (HIGH SENSITIVITY): Troponin I (High Sensitivity): 6 ng/L (ref <18)

## 2023-09-02 LAB — APTT: aPTT: 36 s (ref 24–36)

## 2023-09-02 MED ORDER — SODIUM CHLORIDE 0.9 % IV SOLN: INTRAVENOUS | Status: DC

## 2023-09-02 MED ORDER — CLOPIDOGREL BISULFATE 75 MG PO TABS
75.0000 mg | ORAL_TABLET | Freq: Every day | ORAL | Status: DC
  Administered 2023-09-04 – 2023-09-08 (×5): 75 mg via ORAL
  Filled 2023-09-02 (×5): qty 1

## 2023-09-02 MED ORDER — ASPIRIN 81 MG PO TBEC
81.0000 mg | DELAYED_RELEASE_TABLET | Freq: Every day | ORAL | Status: DC
  Administered 2023-09-02 – 2023-09-08 (×6): 81 mg via ORAL
  Filled 2023-09-02 (×6): qty 1

## 2023-09-02 MED ORDER — ONDANSETRON HCL 4 MG PO TABS: 4.0000 mg | ORAL_TABLET | Freq: Four times a day (QID) | ORAL | Status: DC | PRN

## 2023-09-02 MED ORDER — MAGNESIUM HYDROXIDE 400 MG/5ML PO SUSP
30.0000 mL | Freq: Every day | ORAL | Status: DC | PRN
  Administered 2023-09-04: 30 mL via ORAL
  Filled 2023-09-02: qty 30

## 2023-09-02 MED ORDER — TRAZODONE HCL 50 MG PO TABS
100.0000 mg | ORAL_TABLET | Freq: Every day | ORAL | Status: DC
  Administered 2023-09-02 – 2023-09-07 (×5): 100 mg via ORAL
  Filled 2023-09-02 (×3): qty 2
  Filled 2023-09-02: qty 1
  Filled 2023-09-02 (×2): qty 2

## 2023-09-02 MED ORDER — STROKE: EARLY STAGES OF RECOVERY BOOK: Freq: Once | Status: AC

## 2023-09-02 MED ORDER — ACETAMINOPHEN 650 MG RE SUPP: 650.0000 mg | Freq: Four times a day (QID) | RECTAL | Status: DC | PRN

## 2023-09-02 MED ORDER — ALPRAZOLAM 1 MG PO TABS
1.0000 mg | ORAL_TABLET | Freq: Every evening | ORAL | Status: DC | PRN
  Administered 2023-09-02: 1 mg via ORAL
  Filled 2023-09-02: qty 2

## 2023-09-02 MED ORDER — IOHEXOL 350 MG/ML SOLN
75.0000 mL | Freq: Once | INTRAVENOUS | Status: AC | PRN
  Administered 2023-09-02: 75 mL via INTRAVENOUS

## 2023-09-02 MED ORDER — ONDANSETRON HCL 4 MG/2ML IJ SOLN: 4.0000 mg | Freq: Four times a day (QID) | INTRAMUSCULAR | Status: DC | PRN

## 2023-09-02 MED ORDER — TAMSULOSIN HCL 0.4 MG PO CAPS
0.4000 mg | ORAL_CAPSULE | Freq: Every day | ORAL | Status: DC
  Administered 2023-09-04 – 2023-09-08 (×5): 0.4 mg via ORAL
  Filled 2023-09-02 (×5): qty 1

## 2023-09-02 MED ORDER — FINASTERIDE 5 MG PO TABS
5.0000 mg | ORAL_TABLET | Freq: Every day | ORAL | Status: DC
  Administered 2023-09-04 – 2023-09-08 (×5): 5 mg via ORAL
  Filled 2023-09-02 (×6): qty 1

## 2023-09-02 MED ORDER — TRAZODONE HCL 50 MG PO TABS: 25.0000 mg | ORAL_TABLET | Freq: Every evening | ORAL | Status: DC | PRN

## 2023-09-02 MED ORDER — ENOXAPARIN SODIUM 40 MG/0.4ML IJ SOSY
40.0000 mg | PREFILLED_SYRINGE | INTRAMUSCULAR | Status: DC
  Administered 2023-09-02 – 2023-09-07 (×5): 40 mg via SUBCUTANEOUS
  Filled 2023-09-02 (×6): qty 0.4

## 2023-09-02 MED ORDER — ACETAMINOPHEN 325 MG PO TABS
650.0000 mg | ORAL_TABLET | Freq: Four times a day (QID) | ORAL | Status: DC | PRN
  Administered 2023-09-04: 650 mg via ORAL
  Filled 2023-09-02: qty 2

## 2023-09-02 NOTE — ED Triage Notes (Addendum)
Pt arrives via EMS for L side weakness. Pt reports he fell Monday and again Tuesday. Pt said his L hand was numb when he woke up on Tuesday. Pt reports feeling completely normal on Sunday. Pt has L arm drift and L leg drift. L facial droop present. Speech is clear, and patient is able to answer all orientation questions appropriately.  Pt has hx of TIA

## 2023-09-02 NOTE — ED Triage Notes (Addendum)
First nurse note: Pt to ED via [facility] from home. EMS reports call out for stroke but EMS states generalized weakness. EMS states pt had a fall on Monday and started having increased mobility issues. EMS reports has been taking Amitriptyline for the past week accidentally and stopped it last night.   EMS VS:  88 HR 150/78 94% RA CBG 111 18g LAC

## 2023-09-02 NOTE — Assessment & Plan Note (Addendum)
-   We will add aspirin to his Plavix as mentioned above. 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  Will hold off tonight and keep hydrating him as he just had a CTA of the head and neck.

## 2023-09-02 NOTE — Assessment & Plan Note (Signed)
-   We will continue Flomax 

## 2023-09-02 NOTE — H&P (Signed)
[place]   PATIENT NAME: James [name]    MR#:  [id number]  DATE OF BIRTH:  04/26/1934  DATE OF ADMISSION:  09/02/2023  PRIMARY CARE PHYSICIAN: Marguarite Arbour, MD   Patient is coming from: Home  REQUESTING/REFERRING PHYSICIAN: Minna Antis, MD  CHIEF COMPLAINT:   Chief Complaint  Patient presents with   Weakness   Fall    HISTORY OF PRESENT ILLNESS:  James [name]. is a 89 y.o. Caucasian male with medical history significant for anxiety and TIA, who presented to the emergency room with acute onset of left-sided weakness.  On Monday he stated that he had the TV remote falling from his left hand and when he bent to get it he fell on his left side.  Is been feeling more weakness on the left upper than the left lower extremity since then.  He denies any significant paresthesias or other focal muscle weakness.  No headache or dizziness or blurred vision.  No dysphagia or dysarthria.  No chest pain or palpitations.  No nausea or vomiting or abdominal pain.  He did not any urinary or stool incontinence.  No witnessed seizures.  No tinnitus or vertigo.  ED Course: When he came to the ER, BP was 146/77 with otherwise normal vital signs.  Labs revealed a BUN of 24 with a creatinine 1.31 and glucose was 107.  Total protein was 9 and LFTs were otherwise within normal.  CBC was within normal.  High sensitive troponin I was 6.  Urinalysis was negative. EKG as reviewed by me : Showed normal sinus rhythm with a rate of 87. Imaging: Noncontrast head CT scan revealed no acute intraocular abnormalities.  It showed chronic microvascular ischemic disease and temporal lobe predominant cerebral atrophy that can be seen with Alzheimer's disease.  Brain MRI without contrast revealed acute right thalamic capsular infarct and 2 punctate acute infarcts in the right occipital lobe and possible small subacute infarct in the left frontal white matter with chronic microvascular ischemic  disease.  CT CTA of the head and neck revealed the following: 1. Occlusion versus critical stenosis of the right P2 PCA with poor/irregular distal opacification. 2. Severe distal left P2 PCA stenosis. 3. Moderate stenosis of the non dominant left intradural vertebral artery at its dural margin. 4. 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 A follow-up CTA of the chest is recommended to fully characterize and to determine follow-up recommendations.  He will be admitted to a medical telemetry observation bed for further evaluation and management. PAST MEDICAL HISTORY:   Past Medical History:  Diagnosis Date   Anxiety    TIA (transient ischemic attack)     PAST SURGICAL HISTORY:   Past Surgical History:  Procedure Laterality Date   HAND SURGERY Left    HEMORRHOID SURGERY     TONSILLECTOMY AND ADENOIDECTOMY      SOCIAL HISTORY:   Social History   Tobacco Use   Smoking status: Former    Types: Cigarettes   Smokeless tobacco: Never  Substance Use Topics   Alcohol use: Not Currently    FAMILY HISTORY:   Family History  Problem Relation Age of Onset   Prostate cancer Father     DRUG ALLERGIES:  No Known Allergies  REVIEW OF SYSTEMS:   ROS As per history of present illness. All pertinent systems were reviewed above. Constitutional, HEENT, cardiovascular, respiratory, GI, GU, musculoskeletal, neuro, psychiatric, endocrine, integumentary and hematologic  systems were reviewed and are otherwise negative/unremarkable except for positive findings mentioned above in the HPI.   MEDICATIONS AT HOME:   Prior to Admission medications   Medication Sig Start Date End Date Taking? Authorizing Provider  ALPRAZolam (XANAX) 1 MG tablet TAKE 2 TABLETS (2 MG TOTAL) BY MOUTH NIGHTLY AS NEEDED FOR SLEEP 09/22/19  Yes [provider]  amitriptyline (ELAVIL) 25 MG tablet  06/26/18  Yes [provider]  clopidogrel (PLAVIX) 75 MG  tablet  07/17/18  Yes [provider]  finasteride (PROSCAR) 5 MG tablet Take 1 tablet (5 mg total) by mouth daily. 10/10/21  Yes Stoioff, Verna Czech, MD  imipramine (TOFRANIL) 50 MG tablet Take 50 mg by mouth at bedtime. 04/29/23  Yes [provider]  tamsulosin (FLOMAX) 0.4 MG CAPS capsule TAKE 1 CAPSULE EVERY DAY 08/28/22  Yes Stoioff, Verna Czech, MD  traZODone (DESYREL) 100 MG tablet Take 100 mg by mouth at bedtime. 05/07/23  Yes [provider]      VITAL SIGNS:  Blood pressure (!) 155/81, pulse 86, temperature 98.2 F (36.8 C), temperature source Oral, resp. rate 19, height 5\' 8"  (1.727 m), weight 72.6 kg, SpO2 100%.  PHYSICAL EXAMINATION:  Physical Exam  GENERAL:  89 y.o.-year-old patient lying in the bed with no acute distress.  EYES: Pupils equal, round, reactive to light and accommodation. No scleral icterus. Extraocular muscles intact.  HEENT: Head atraumatic, normocephalic. Oropharynx and nasopharynx clear.  NECK:  Supple, no jugular venous distention. No thyroid enlargement, no tenderness.  LUNGS: Normal breath sounds bilaterally, no wheezing, rales,rhonchi or crepitation. No use of accessory muscles of respiration.  CARDIOVASCULAR: Regular rate and rhythm, S1, S2 normal. No murmurs, rubs, or gallops.  ABDOMEN: Soft, nondistended, nontender. Bowel sounds present. No organomegaly or mass.  EXTREMITIES: No pedal edema, cyanosis, or clubbing.  NEUROLOGIC: Cranial nerves II through XII are intact. Muscle strength 2-3/5 in the left upper extremity and 3-4/5 in the left lower extremity compared to 5/5 in the right upper and lower extremities.  Sensation intact. Gait not checked.  PSYCHIATRIC: The patient is alert and oriented x 3.  Normal affect and good eye contact. SKIN: No obvious rash, lesion, or ulcer.   LABORATORY PANEL:   CBC Recent Labs  Lab 09/02/23 1227  WBC 5.8  HGB 15.0  HCT 46.1  PLT 170    ------------------------------------------------------------------------------------------------------------------  Chemistries  Recent Labs  Lab 09/02/23 1227  NA 138  K 3.9  CL 104  CO2 24  GLUCOSE 107*  BUN 24*  CREATININE 1.31*  CALCIUM 9.4  AST 25  ALT 17  ALKPHOS 74  BILITOT 1.1   ------------------------------------------------------------------------------------------------------------------  Cardiac Enzymes No results for input(s): "TROPONINI" in the last 168 hours. ------------------------------------------------------------------------------------------------------------------  RADIOLOGY:  CT ANGIO HEAD NECK W WO CM  Result Date: 09/02/2023 CLINICAL DATA:  Stroke/TIA, determine embolic source EXAM: CT ANGIOGRAPHY HEAD AND NECK WITH AND WITHOUT CONTRAST TECHNIQUE: Multidetector CT imaging of the head and neck was performed using the standard protocol during bolus administration of intravenous contrast. Multiplanar CT image reconstructions and MIPs were obtained to evaluate the vascular anatomy. Carotid stenosis measurements (when applicable) are obtained utilizing NASCET criteria, using the distal internal carotid diameter as the denominator. RADIATION DOSE REDUCTION: This exam was performed according to the departmental dose-optimization program which includes automated exposure control, adjustment of the mA and/or kV according to patient size and/or use of iterative reconstruction technique. CONTRAST:  75mL OMNIPAQUE IOHEXOL 350 MG/ML SOLN COMPARISON:  Same day CT  head and MRI head. FINDINGS: CTA NECK FINDINGS Aortic arch: Great vessel origins are patent. Right carotid system: Atherosclerosis at the carotid bifurcation without greater than 50% stenosis. Left carotid system: Atherosclerosis at the carotid bifurcation without greater than 50% stenosis. Vertebral arteries: Right dominant. Rim both vertebral arteries are patent without significant (greater than 50%) stenosis  in the neck. Skeleton: No acute abnormality on limited assessment. Other neck: No acute abnormality on limited assessment. 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 Review of the MIP images confirms the above findings CTA HEAD FINDINGS Anterior circulation: Bilateral intracranial ICAs, Posterior circulation: Moderate stenosis of the non dominant left intradural vertebral artery at its dural margin. The dominant right vertebral artery and basilar artery are patent without significant stenosis. Occlusion versus critical stenosis of the right P2 PCA with poor/irregular distal opacification. Severe distal left P2 PCA stenosis. Venous sinuses: As permitted by contrast timing, patent. Review of the MIP images confirms the above findings IMPRESSION: 1. Occlusion versus critical stenosis of the right P2 PCA with poor/irregular distal opacification. 2. Severe distal left P2 PCA stenosis. 3. Moderate stenosis of the non dominant left intradural vertebral artery at its dural margin. 4. 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 A follow-up CTA of the chest is recommended to fully characterize and to determine follow-up recommendations. Findings discussed with Dr. Lenard Lance via telephone at 9:24 p.m. Electronically Signed   By: Feliberto Harts M.D.   On: 09/02/2023 21:26   MR BRAIN WO CONTRAST  Result Date: 09/02/2023 CLINICAL DATA:  Neuro deficit, acute, stroke suspected acute right EXAM: MRI HEAD WITHOUT CONTRAST TECHNIQUE: Multiplanar, multiecho pulse sequences of the brain and surrounding structures were obtained without intravenous contrast. COMPARISON:  Same day CT head. FINDINGS: Brain: Acute right thalamocapsular infarct. Mild edema without mass effect. Two punctate acute infarcts in the right occipital lobe and possible small subacute infarct in the left frontal white matter. Additional scattered T2/FLAIR hyperintensities  in the white matter, compatible with chronic microvascular ischemic change. No evidence acute hemorrhage, mass lesion, midline shift or hydrocephalus. Cerebral atrophy. Vascular: Major arterial flow voids are maintained skull base. Skull and upper cervical spine: Normal marrow signal. Sinuses/Orbits: Clear sinuses.  No acute orbital findings. Other: No mastoid effusions. IMPRESSION: 1. Acute right thalamocapsular infarct. 2. Two punctate acute infarcts in the right occipital lobe and possible small subacute infarct in the left frontal white matter. 3. Chronic microvascular ischemic disease. Electronically Signed   By: Feliberto Harts M.D.   On: 09/02/2023 18:52   CT Head Wo Contrast  Result Date: 09/02/2023 CLINICAL DATA:  Neuro deficit, acute, stroke suspected EXAM: CT HEAD WITHOUT CONTRAST TECHNIQUE: Contiguous axial images were obtained from the base of the skull through the vertex without intravenous contrast. RADIATION DOSE REDUCTION: This exam was performed according to the departmental dose-optimization program which includes automated exposure control, adjustment of the mA and/or kV according to patient size and/or use of iterative reconstruction technique. COMPARISON:  CT May 09, 2023. FINDINGS: Brain: Extensive patchy white matter hypodensities are nonspecific but compatible with chronic microvascular ischemic disease. Remote lacunar infarct in the left thalamus. No evidence of acute large vascular territory infarct, acute hemorrhage, mass lesion, midline shift or hydrocephalus. Cerebral atrophy which is temporal lobe predominant (particularly on the right). Vascular: No hyperdense vessel. Skull: No acute fracture. Sinuses/Orbits: No acute finding. IMPRESSION: 1. No evidence of acute intracranial abnormality. 2. Chronic microvascular  ischemic disease. 3. Temporal lobe predominant cerebral atrophy (ICD10-G31.9), which can be seen with Alzheimer's disease. Electronically Signed   By: Feliberto Harts  M.D.   On: 09/02/2023 14:36      IMPRESSION AND PLAN:  Assessment and Plan: * Acute thalamic infarction [facility]) - The patient will be admitted to an observation medically monitored bed. - This is manifested by left-sided hemiparesis. - We will follow neuro checks q.4 hours for 24 hours.   - The patient will be placed on aspirin in addition to his Plavix.   - We obtained head and neck CTA and we will obtain a 2D echo with bubble study .   - A neurology consultation  as well as physical/occupation/speech therapy consults will be obtained in a.m.Marland Kitchen - I notified Dr. Wilford Corner about the patient. - The patient will be placed on statin therapy and fasting lipids will be checked.   PAD (peripheral artery disease) (HCC) - We will add aspirin to his Plavix as mentioned above. 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 - He also has a descending aortic aneurysm measures up to 3.5 cm that will need follow-up CT chest CTA  Benign localized prostatic hyperplasia with lower urinary tract symptoms (LUTS) - We will continue Flomax.  Anxiety - We will continue Xanax.     DVT prophylaxis: Lovenox.  Advanced Care Planning:  Code Status: The patient is DNR only.  This was discussed with him. Family Communication:  The plan of care was discussed in details with the patient (and family). I answered all questions. The patient agreed to proceed with the above mentioned plan. Further management will depend upon hospital course. Disposition Plan: Back to previous home environment Consults called: Neurology All the records are reviewed and case discussed with ED provider.  Status is: Observation  I certify that at the time of admission, it is my clinical judgment that the patient will require  hospital care extending less than 2 midnights.                             Dispo: The patient is from: Home              Anticipated d/c is to: Home              Patient currently is not medically stable to d/c.              Difficult to place patient: No  Hannah Beat M.D on 09/02/2023 at 11:11 PM  [facility]   From 7 PM-7 AM, contact night-coverage [email or web address]  CC: Primary care physician; Marguarite Arbour, MD

## 2023-09-02 NOTE — ED Provider Notes (Signed)
[facility] Provider Note    Event Date/Time   First MD Initiated Contact with Patient 09/02/23 1512     (approximate)  History   Chief Complaint: Weakness and Fall  HPI  James [name]. is a 89 y.o. male with a past medical history of anxiety, prior TIA, presents to the emergency department for left arm weakness.  According to the patient on Monday he was watching TV states the remote fell out of his left arm he went to try to pick it up and fell falling onto his left side.  He states since that time he has noted weakness to the left hand and arm as well as some mild numbness in the left hand.  Patient denies any history of CVA previously.  States he did have a TIA but does not recall the exact symptoms he believes he passed out at that time.  No LOC this time.  Physical Exam   Triage Vital Signs: ED Triage Vitals  Encounter Vitals Group     BP 09/02/23 1216 135/73     Systolic BP Percentile --      Diastolic BP Percentile --      Pulse Rate 09/02/23 1216 65     Resp 09/02/23 1216 16     Temp 09/02/23 1216 98.4 F (36.9 C)     Temp Source 09/02/23 1216 Oral     SpO2 09/02/23 1216 100 %     Weight 09/02/23 1221 160 lb (72.6 kg)     Height 09/02/23 1221 5\' 8"  (1.727 m)     Head Circumference --      Peak Flow --      Pain Score 09/02/23 1220 0     Pain Loc --      Pain Education --      Exclude from Growth Chart --     Most recent vital signs: Vitals:   09/02/23 1216 09/02/23 1512  BP: 135/73 (!) 146/77  Pulse: 65 64  Resp: 16 18  Temp: 98.4 F (36.9 C)   SpO2: 100% 99%    General: Awake, no distress.  CV:  Good peripheral perfusion.  Regular rate and rhythm  Resp:  Normal effort.  Equal breath sounds bilaterally.  Abd:  No distention.  Soft, nontender.  No rebound or guarding. Other:  On neurologic examination patient has diminished grip strength in left upper extremity with difficulty holding the left arm up above gravity after 5  seconds.  Patient states subjective numbness in the left hand compared to the right hand.  No lower extremity symptoms.  Very slight left facial droop on exam.   ED Results / Procedures / Treatments   EKG  EKG viewed and interpreted by myself shows a normal sinus rhythm 87 bpm with a narrow QRS, normal axis, normal intervals, no concerning ST changes.  RADIOLOGY  I have reviewed and interpreted the CT head images.  No bleed on my evaluation. Radiology is read the CT scan is negative for acute abnormality.   MEDICATIONS ORDERED IN ED: Medications - No data to display   IMPRESSION / MDM / ASSESSMENT AND PLAN / ED COURSE  I reviewed the triage vital signs and the nursing notes.  Patient's presentation is most consistent with acute presentation with potential threat to life or bodily function.  Patient presents the emergency department for left arm weakness since Monday.  Patient is well outside of the TNK window.  On examination he does have  a mild left pronator drift mild weakness on testing of the left arm and a possible very slight left facial droop.  CT scan shows no acute findings, lab work is reassuring with a normal CBC, normal chemistry.  Given the patient's weakness sensation although appears to be somewhat focal we will check a urinalysis as well as a COVID swab.  Will proceed with an MRI to evaluate for CVA.  Patient and son agreeable to plan of care.  MRI confirms right thalamus CVA.  Remainder the lab work is reassuring including normal urinalysis, negative COVID/flu, reassuring CBC reassuring chemistry and a negative troponin.  Patient admitted to the hospital service for further workup and treatment.  FINAL CLINICAL IMPRESSION(S) / ED DIAGNOSES   Left arm weakness Acute CVA   Note:  This document was prepared using Dragon voice recognition software and may include unintentional dictation errors.   Minna Antis, MD 09/02/23 2009

## 2023-09-02 NOTE — Assessment & Plan Note (Signed)
-  We will continue Xanax.

## 2023-09-02 NOTE — Assessment & Plan Note (Addendum)
-   The patient will be admitted to an observation medically monitored bed. - This is manifested by left-sided hemiparesis. - We will follow neuro checks q.4 hours for 24 hours.   - The patient will be placed on aspirin in addition to his Plavix.   - We obtained head and neck CTA and we will obtain a 2D echo with bubble study .   - A neurology consultation  as well as physical/occupation/speech therapy consults will be obtained in a.m.Marland Kitchen - I notified Dr. Wilford Corner about the patient. - The patient will be placed on statin therapy and fasting lipids will be checked.

## 2023-09-03 ENCOUNTER — Observation Stay
Admit: 2023-09-03 | Discharge: 2023-09-03 | Disposition: A | Discharge status: Home / Self Care | Payer: Medicare Other | Attending: Family Medicine | Admitting: Family Medicine

## 2023-09-03 ENCOUNTER — Observation Stay (HOSPITAL_COMMUNITY)
Admit: 2023-09-03 | Discharge: 2023-09-03 | Disposition: A | Discharge status: Home / Self Care | Payer: Medicare Other | Attending: Family Medicine | Admitting: Family Medicine

## 2023-09-03 DIAGNOSIS — F419 Anxiety disorder, unspecified: Secondary | ICD-10-CM | POA: Diagnosis present

## 2023-09-03 DIAGNOSIS — I639 Cerebral infarction, unspecified: Secondary | ICD-10-CM | POA: Diagnosis present

## 2023-09-03 DIAGNOSIS — N401 Enlarged prostate with lower urinary tract symptoms: Secondary | ICD-10-CM | POA: Diagnosis present

## 2023-09-03 DIAGNOSIS — M792 Neuralgia and neuritis, unspecified: Secondary | ICD-10-CM | POA: Diagnosis not present

## 2023-09-03 DIAGNOSIS — T801XXD Vascular complications following infusion, transfusion and therapeutic injection, subsequent encounter: Secondary | ICD-10-CM | POA: Diagnosis not present

## 2023-09-03 DIAGNOSIS — R5381 Other malaise: Secondary | ICD-10-CM | POA: Diagnosis not present

## 2023-09-03 DIAGNOSIS — I739 Peripheral vascular disease, unspecified: Secondary | ICD-10-CM | POA: Diagnosis present

## 2023-09-03 DIAGNOSIS — N400 Enlarged prostate without lower urinary tract symptoms: Secondary | ICD-10-CM | POA: Diagnosis not present

## 2023-09-03 DIAGNOSIS — I6389 Other cerebral infarction: Secondary | ICD-10-CM

## 2023-09-03 DIAGNOSIS — Z1152 Encounter for screening for COVID-19: Secondary | ICD-10-CM | POA: Diagnosis not present

## 2023-09-03 DIAGNOSIS — N39498 Other specified urinary incontinence: Secondary | ICD-10-CM | POA: Diagnosis not present

## 2023-09-03 DIAGNOSIS — Z7982 Long term (current) use of aspirin: Secondary | ICD-10-CM | POA: Diagnosis not present

## 2023-09-03 DIAGNOSIS — F4321 Adjustment disorder with depressed mood: Secondary | ICD-10-CM | POA: Diagnosis not present

## 2023-09-03 DIAGNOSIS — K5901 Slow transit constipation: Secondary | ICD-10-CM | POA: Diagnosis not present

## 2023-09-03 DIAGNOSIS — K219 Gastro-esophageal reflux disease without esophagitis: Secondary | ICD-10-CM | POA: Diagnosis present

## 2023-09-03 DIAGNOSIS — Z87891 Personal history of nicotine dependence: Secondary | ICD-10-CM | POA: Diagnosis not present

## 2023-09-03 DIAGNOSIS — I7122 Aneurysm of the aortic arch, without rupture: Secondary | ICD-10-CM | POA: Diagnosis present

## 2023-09-03 DIAGNOSIS — Z79899 Other long term (current) drug therapy: Secondary | ICD-10-CM | POA: Diagnosis not present

## 2023-09-03 DIAGNOSIS — R1310 Dysphagia, unspecified: Secondary | ICD-10-CM | POA: Diagnosis not present

## 2023-09-03 DIAGNOSIS — W19XXXA Unspecified fall, initial encounter: Secondary | ICD-10-CM | POA: Diagnosis not present

## 2023-09-03 DIAGNOSIS — E785 Hyperlipidemia, unspecified: Secondary | ICD-10-CM | POA: Diagnosis present

## 2023-09-03 DIAGNOSIS — Z66 Do not resuscitate: Secondary | ICD-10-CM | POA: Diagnosis present

## 2023-09-03 DIAGNOSIS — Z8673 Personal history of transient ischemic attack (TIA), and cerebral infarction without residual deficits: Secondary | ICD-10-CM | POA: Diagnosis not present

## 2023-09-03 DIAGNOSIS — R29706 NIHSS score 6: Secondary | ICD-10-CM | POA: Diagnosis present

## 2023-09-03 DIAGNOSIS — R5383 Other fatigue: Secondary | ICD-10-CM | POA: Diagnosis not present

## 2023-09-03 DIAGNOSIS — I6381 Other cerebral infarction due to occlusion or stenosis of small artery: Secondary | ICD-10-CM | POA: Diagnosis present

## 2023-09-03 DIAGNOSIS — Z8042 Family history of malignant neoplasm of prostate: Secondary | ICD-10-CM | POA: Diagnosis not present

## 2023-09-03 DIAGNOSIS — K5900 Constipation, unspecified: Secondary | ICD-10-CM | POA: Diagnosis not present

## 2023-09-03 DIAGNOSIS — F411 Generalized anxiety disorder: Secondary | ICD-10-CM | POA: Diagnosis not present

## 2023-09-03 DIAGNOSIS — G629 Polyneuropathy, unspecified: Secondary | ICD-10-CM | POA: Diagnosis present

## 2023-09-03 DIAGNOSIS — I69391 Dysphagia following cerebral infarction: Secondary | ICD-10-CM | POA: Diagnosis not present

## 2023-09-03 DIAGNOSIS — G4700 Insomnia, unspecified: Secondary | ICD-10-CM | POA: Diagnosis not present

## 2023-09-03 DIAGNOSIS — I10 Essential (primary) hypertension: Secondary | ICD-10-CM | POA: Diagnosis present

## 2023-09-03 DIAGNOSIS — N189 Chronic kidney disease, unspecified: Secondary | ICD-10-CM | POA: Diagnosis not present

## 2023-09-03 DIAGNOSIS — Z7902 Long term (current) use of antithrombotics/antiplatelets: Secondary | ICD-10-CM | POA: Diagnosis not present

## 2023-09-03 DIAGNOSIS — I63431 Cerebral infarction due to embolism of right posterior cerebral artery: Secondary | ICD-10-CM | POA: Diagnosis present

## 2023-09-03 DIAGNOSIS — G8194 Hemiplegia, unspecified affecting left nondominant side: Secondary | ICD-10-CM | POA: Diagnosis present

## 2023-09-03 LAB — BASIC METABOLIC PANEL
Anion gap: 6 (ref 5–15)
BUN: 27 mg/dL — ABNORMAL HIGH (ref 8–23)
CO2: 25 mmol/L (ref 22–32)
Calcium: 8.6 mg/dL — ABNORMAL LOW (ref 8.9–10.3)
Chloride: 105 mmol/L (ref 98–111)
Creatinine, Ser: 1.16 mg/dL (ref 0.61–1.24)
GFR, Estimated: >60 mL/min (ref >60)
Glucose, Bld: 96 mg/dL (ref 70–99)
Potassium: 3.9 mmol/L (ref 3.5–5.1)
Sodium: 136 mmol/L (ref 135–145)

## 2023-09-03 LAB — CBC
HCT: 38.4 % — ABNORMAL LOW (ref 39.0–52.0)
Hemoglobin: 12.8 g/dL — ABNORMAL LOW (ref 13.0–17.0)
MCH: 30.2 pg (ref 26.0–34.0)
MCHC: 33.3 g/dL (ref 30.0–36.0)
MCV: 90.6 fL (ref 80.0–100.0)
Platelets: 163 10*3/uL (ref 150–400)
RBC: 4.24 MIL/uL (ref 4.22–5.81)
RDW: 13.2 % (ref 11.5–15.5)
WBC: 5.5 10*3/uL (ref 4.0–10.5)
nRBC: 0 % (ref 0.0–0.2)

## 2023-09-03 LAB — ECHOCARDIOGRAM COMPLETE BUBBLE STUDY
AR max vel: 2.18 cm2
AV Area VTI: 2.09 cm2
AV Area mean vel: 2.01 cm2
AV Mean grad: 2 mmHg
AV Peak grad: 2.8 mmHg
Ao pk vel: 0.84 m/s
Area-P 1/2: 3.56 cm2
MV VTI: 1.77 cm2
S' Lateral: 2.6 cm

## 2023-09-03 LAB — LIPID PANEL
Cholesterol: 199 mg/dL (ref 0–200)
HDL: 52 mg/dL (ref >40)
LDL Cholesterol: 135 mg/dL — ABNORMAL HIGH (ref 0–99)
Total CHOL/HDL Ratio: 3.8 RATIO
Triglycerides: 61 mg/dL (ref <150)
VLDL: 12 mg/dL (ref 0–40)

## 2023-09-03 LAB — HEMOGLOBIN A1C
Hgb A1c MFr Bld: 5.5 % (ref 4.8–5.6)
Mean Plasma Glucose: 111 mg/dL

## 2023-09-03 MED ORDER — ATORVASTATIN CALCIUM 20 MG PO TABS
40.0000 mg | ORAL_TABLET | Freq: Every day | ORAL | Status: DC
  Administered 2023-09-03 – 2023-09-08 (×6): 40 mg via ORAL
  Filled 2023-09-03 (×6): qty 2

## 2023-09-03 MED ORDER — ALPRAZOLAM 1 MG PO TABS
1.0000 mg | ORAL_TABLET | Freq: Two times a day (BID) | ORAL | Status: DC | PRN
  Administered 2023-09-03 – 2023-09-07 (×5): 1 mg via ORAL
  Filled 2023-09-03 (×5): qty 1

## 2023-09-03 NOTE — Progress Notes (Signed)
*  PRELIMINARY RESULTS* Echocardiogram 2D Echocardiogram has been performed.  Carolyne Fiscal 09/03/2023, 10:58 AM

## 2023-09-03 NOTE — Progress Notes (Signed)
OT Cancellation Note  Patient Details Name: James [name]. MRN: [id number] DOB: 11/23/1934   Cancelled Treatment:    Reason Eval/Treat Not Completed: Patient at procedure or test/ unavailable Chart reviewed, pt unavailable. ECHO in progress. OT will re-attempt as able.   Renalda Locklin L. Yon Schiffman, OTR/L  09/03/23, 10:36 AM

## 2023-09-03 NOTE — Evaluation (Addendum)
Clinical/Bedside Swallow Evaluation Patient Details  Name: James [name]. MRN: [id number] Date of Birth: November 10, 1934  Today's Date: 09/03/2023 Time: SLP Start Time (ACUTE ONLY): 1135 SLP Stop Time (ACUTE ONLY): 1225 SLP Time Calculation (min) (ACUTE ONLY): 50 min  Past Medical History:  Past Medical History:  Diagnosis Date   Anxiety    TIA (transient ischemic attack)    Past Surgical History:  Past Surgical History:  Procedure Laterality Date   HAND SURGERY Left    HEMORRHOID SURGERY     TONSILLECTOMY AND ADENOIDECTOMY     HPI:  Pt is a 89 y.o. Caucasian male with medical history significant for anxiety and TIA, who presented to the emergency room on Wednesday with ongoing onset of left-sided weakness.  On Monday, he stated that he had the TV remote falling from his left hand and when he bent to get it he fell on his left side.  Is been feeling more weakness on the left upper than the left lower extremity since then.  He denies any significant paresthesias or other focal muscle weakness.  No headache or dizziness or blurred vision.  No dysphagia or dysarthria.  No chest pain or palpitations.  No nausea or vomiting or abdominal pain.  Imaging: Noncontrast head CT scan revealed no acute intraocular abnormalities.  It showed chronic microvascular ischemic disease and temporal lobe predominant cerebral atrophy that can be seen with Alzheimer's disease.  Brain MRI without contrast revealed acute right thalamic capsular infarct and 2 punctate acute infarcts in the right occipital lobe and possible small subacute infarct in the left frontal white matter with chronic microvascular ischemic disease.  CT CTA of the head and neck revealed the following:  1. Occlusion versus critical stenosis of the right P2 PCA with  poor/irregular distal opacification.  2. Severe distal left P2 PCA stenosis.  3. 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  4.  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 A follow-up CTA of the chest is recommended to  fully characterize and to determine follow-up recommendations.    Assessment / Plan / Recommendation  Clinical Impression   Pt seen for BSE today. Pt awake, verbal and sitting in chair w/ Family in room. Pt followed all instructions; reported min L U/L extremity decreased sensation. Able to feed himself. Noted slight decreased L labial tone in corner of mouth.  On RA, afebrile. WBC WNL.  Pt appears to present w/ functional oral phase swallowing abilities w/ suspected min pharyngeal phase dysphagia, suspected min sensorimotor deficits in setting of new R thalamic infarct. Pt consumed po trials w/ inconsistent, min clinical s/s of aspiration(throat clearing) w/ trials of thin liquids. No overt, clinical s/s of aspiration during other po consistencies or when pt utilized swallowing precautions/strategies during subsequent trials of thin liquids.  Pt appears at reduced risk for aspiration/aspiration pneumonia when following aspiration precautions and swallowing strategies.  Pt does have challenging factors that could impact oropharyngeal swallowing to include new R thalamic infarct, min vision changes from infarcts in the occipital lobe, deconditioning/weakness, and advanced age as well as current LUE decreased sensation potentially impacting self-feeding abilities. These factors can increase risk for aspiration, dysphagia as well as decreased oral intake overall.   During po trials, pt consumed consistencies w/ no overt coughing, decline in vocal quality, or change in respiratory presentation during/post trials. O2 sats remained 98%. However, noted mild throat clearing x2-3(inconsistent)  w/ trials of thin liquids. When swallow strategy of looking midline-slight look down and not away from his Left side (by this SLP and/or any visitor sitting on his Left) was utilized, no further  overt s/s noted w/ trials of thin liquids via Cup. Oral phase appeared [facility] w/ timely bolus management, mastication, and control of bolus propulsion for A-P transfer for swallowing. Oral clearing achieved w/ all trial consistencies -- moistened, soft foods given.  OM Exam appeared grossly [facility] w/ only a slight decreased Left labial decreased tone noted in Left corner of mouth -- No unilateral lingual weakness noted. Cough strong. Speech Clear.  Pt fed self w/ setup support.   Recommend a fairly Regular consistency diet w/ well-Cut meats, moistened foods; Thin liquids -- CUP DRINKING ONLY. No Straws. Recommend general aspiration precautions, swallowing strategies as above practiced during session. Reduce distractions/talking during meals. Pills WHOLE in Puree for safer, easier swallowing for now and for D/C to the pt and Dtr.  Education given on Pills in Puree; food consistencies and easy to eat options; general aspiration precautions to pt and Dtr. ST services will f/u to monitor toleration of diet and use of swallowing precautions/strategies; education. Objective swallow assessment if indicated. NSG/MD updated. Recommend Dietician f/u for support. Pt agreed. Handout left w/ pt in room. Precautions posted on board.  RE: cognitive-language evaluation order - pt is 100% intelligible w/ no Dysarthria from the slight Left labial corner of mouth decreased tone. Noted Head Imaging. Will f/u w/ Neurology and further assessment while admitted. SLP Visit Diagnosis: Dysphagia, pharyngeal phase (R13.13) (mild suspicion)    Aspiration Risk  Mild aspiration risk;Risk for inadequate nutrition/hydration (reduced following general aspiration precautions)    Diet Recommendation   Thin;Age appropriate regular (cut meats, no salads) = a fairly Regular consistency diet w/ well-Cut meats, moistened foods; Thin liquids -- CUP DRINKING ONLY. No Straws. Recommend general aspiration precautions, swallowing strategies as above  practiced during session. Reduce distractions/talking during meals.   Medication Administration: Whole meds with puree    Other  Recommendations Recommended Consults:  (Dietician f/u) Oral Care Recommendations: Oral care BID;Patient independent with oral care (give setup)    Recommendations for follow up therapy are one component of a multi-disciplinary discharge planning process, led by the attending physician.  Recommendations may be updated based on patient status, additional functional criteria and insurance authorization.  Follow up Recommendations Follow physician's recommendations for discharge plan and follow up therapies (TBD)      Assistance Recommended at Discharge  Intermittent  Functional Status Assessment Patient has had a recent decline in their functional status and demonstrates the ability to make significant improvements in function in a reasonable and predictable amount of time.  Frequency and Duration min 2x/week  2 weeks       Prognosis Prognosis for improved oropharyngeal function: Good Barriers to Reach Goals: Time post onset;Severity of deficits Barriers/Prognosis Comment: advanced age      Swallow Study   General Date of Onset: 09/02/23 HPI: Pt is a 89 y.o. Caucasian male with medical history significant for anxiety and TIA, who presented to the emergency room on Wednesday with ongoing onset of left-sided weakness.  On Monday, he stated that he had the TV remote falling from his left hand and when he bent to get it he fell on his left side.  Is been feeling more weakness on the left upper than the left lower extremity since then.  He denies any significant paresthesias or other focal muscle weakness.  No headache or dizziness or blurred vision.  No dysphagia or dysarthria.  No chest pain or palpitations.  No nausea or vomiting or abdominal pain.  Imaging: Noncontrast head CT scan revealed no acute intraocular abnormalities.  It showed chronic microvascular  ischemic disease and temporal lobe predominant cerebral atrophy that can be seen with Alzheimer's disease.  Brain MRI without contrast revealed acute right thalamic capsular infarct and 2 punctate acute infarcts in the right occipital lobe and possible small subacute infarct in the left frontal white matter with chronic microvascular ischemic disease.  CT CTA of the head and neck revealed the following:  1. Occlusion versus critical stenosis of the right P2 PCA with  poor/irregular distal opacification.  2. Severe distal left P2 PCA stenosis.  3. 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  4. 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 A follow-up CTA of the chest is recommended to  fully characterize and to determine follow-up recommendations. Type of Study: Bedside Swallow Evaluation Previous Swallow Assessment: none Diet Prior to this Study: NPO Temperature Spikes Noted: No (wbc 5.5) Respiratory Status: Room air History of Recent Intubation: No Behavior/Cognition: Alert;Cooperative;Pleasant mood;Distractible;Requires cueing (min vision deficits in Left eye) Oral Cavity Assessment: Within Functional Limits Oral Care Completed by SLP: Yes Oral Cavity - Dentition: Adequate natural dentition;Missing dentition (few) Vision: Functional for self-feeding (min vision change in Left eye per pt) Self-Feeding Abilities: Able to feed self;Needs set up Patient Positioning: Upright in chair Baseline Vocal Quality: Normal Volitional Cough: Strong Volitional Swallow: Able to elicit    Oral/Motor/Sensory Function Overall Oral Motor/Sensory Function: Within functional limits (>slight decreased Left labial tone in corner of mouth) Facial ROM: Reduced left;Suspected CN VII (facial) dysfunction (slight) Facial Symmetry: Abnormal symmetry left;Suspected CN VII (facial) dysfunction (slight) Facial Strength: Within Functional  Limits Lingual ROM: Within Functional Limits Lingual Symmetry: Within Functional Limits Lingual Strength: Within Functional Limits Lingual Sensation: Within Functional Limits Velum: Within Functional Limits Mandible: Within Functional Limits   Ice Chips Ice chips: Within functional limits Presentation: Spoon (fed; 3 trials)   Thin Liquid Thin Liquid: Impaired Presentation: Cup;Self Fed (12+ trials) Oral Phase Impairments:  (WFL) Pharyngeal  Phase Impairments: Suspected delayed Swallow;Throat Clearing - Delayed (x2) Other Comments: utilized midline-slight look down and not away from his Left side (by sitting on his Left) w/ no overt s/s noted    Nectar Thick Nectar Thick Liquid: Not tested   Honey Thick Honey Thick Liquid: Not tested   Puree Puree: Within functional limits Presentation: Self Fed;Spoon (8 ozs)   Solid     Solid: Within functional limits Presentation: Self Fed (9 trials)        Jerilynn Som, MS, CCC-SLP Speech Language Pathologist Rehab Services; [facility] [place] [phone] (ascom) Keldon Lassen 09/03/2023,4:15 PM

## 2023-09-03 NOTE — Evaluation (Signed)
Physical Therapy Evaluation Patient Details Name: James [name]. MRN: [id number] DOB: 10-21-1934 Today's Date: 09/03/2023  History of Present Illness  presented to ER secondary to L-sided weakness; admitted for TIA/CVA work up.  Imaging significant for R thalamocapsular infarct, R occipital and L frontal infarcts.  Clinical Impression  Patient resting in bed, supportive daughter at bedside. Patient alert and oriented to basic information; follows simple commands, pleasant and cooperative. Does demonstrates some L-sided inattention, limited insight into deficits and overall delay in processing/initiation of both conversation and functional activities.  Functional weakness noted to L UE/LE with strength/ROM testing; strength generally 3+ to 4-/5, but with noted delay in motor planning and coordination throughout L hemi-body.   Currently requiring min/mod assist for bed mobility; min/mod assist for unsupported sitting balance (R lateral, forward trunk lean; limited/no spontaneous righting reactions); min/mod assist +2 for sit/stand, standing balance and bed/chair transfers.  Demonstrates manual cuing for postural extension and upward gaze, midline in A/P and M/L planes; constant assist for weight shift, L LE placement and overall balance.  Additional gait deferred at this time; will continue to assess/progress in subsequent sessions as appropriate (do anticipate +2 and optimal performance with bilat HHA). Would benefit from skilled PT to address above deficits and promote optimal return to PLOF.; recommend post-acute PT follow up as indicated by interdisciplinary care team.            If plan is discharge home, recommend the following: A lot of help with walking and/or transfers;A lot of help with bathing/dressing/bathroom   Can travel by private vehicle        Equipment Recommendations    Recommendations for Other Services       Functional Status Assessment Patient has had a recent  decline in their functional status and demonstrates the ability to make significant improvements in function in a reasonable and predictable amount of time.     Precautions / Restrictions Precautions Precautions: Fall Restrictions Weight Bearing Restrictions: No      Mobility  Bed Mobility Overal bed mobility: Needs Assistance Bed Mobility: Supine to Sit     Supine to sit: Min assist, Mod assist          Transfers Overall transfer level: Needs assistance   Transfers: Sit to/from Stand, Bed to chair/wheelchair/BSC Sit to Stand: Mod assist, +2 physical assistance Stand pivot transfers: Mod assist, +2 physical assistance         General transfer comment: manual cuing for postural extension and upward gaze, midline in A/P and M/L planes; constant assist for weight shift, L LE placement and overall balance    Ambulation/Gait               General Gait Details: deferred this date  Stairs            Wheelchair Mobility     Tilt Bed    Modified Rankin (Stroke Patients Only)       Balance Overall balance assessment: Needs assistance Sitting-balance support: No upper extremity supported, Feet supported Sitting balance-Leahy Scale: Poor Sitting balance - Comments: R lateral lean, forward trunk flexion with very forward head, kyphotic posture; limited/no righting reactions unless cued by therapist. Min/mod assist to maintain, improving to cga/min assist with constant cuing   Standing balance support: Bilateral upper extremity supported Standing balance-Leahy Scale: Poor Standing balance comment: + 2 for safety  Pertinent Vitals/Pain Pain Assessment Pain Assessment: No/denies pain    Home Living Family/patient expects to be discharged to:: Private residence Living Arrangements: Spouse/significant other Available Help at Discharge: Family Type of Home: House Home Access: Stairs to enter   Water quality scientist of Steps: 1 Alternate Level Stairs-Number of Steps: no essential needs on upper level of home Home Layout: Two level;Able to live on main level with bedroom/bathroom Home Equipment: Rolling Walker (2 wheels);Cane - single point      Prior Function Prior Level of Function : Independent/Modified Independent             Mobility Comments: Mod indep with RW (longer distances) versus SPC (shorter-distances); denies recent fall history.  No driving.  Was actively participating with HHPT prior to admission ADLs Comments: Mod indep with ADLs and light meal prep; does have aide that comes regularly to assist patient/wife.     Extremity/Trunk Assessment   Upper Extremity Assessment Upper Extremity Assessment:  (L UE grossly 4-/5 throughout; moderate impairment in fine motor control, noted inattention to extremity (esp with dual task/divided attention activities))    Lower Extremity Assessment Lower Extremity Assessment:  (L LE gorssly 3+ to 4-/5 throughout; marked difficulty with purposeful activation/termination of isolated muscle groups, marked difficulty with coordination/motor planning)    Cervical / Trunk Assessment Cervical / Trunk Assessment: Kyphotic  Communication      Cognition Arousal: Alert Behavior During Therapy: Flat affect Overall Cognitive Status: Difficult to assess                                 General Comments: Alert and oriented to basic information; follows simple commands.  Marked delay in processing/task initiation at times.  Noted inattention to L hemi-body (esp with dual-task activities)        General Comments      Exercises     Assessment/Plan    PT Assessment Patient needs continued PT services  PT Problem List Decreased strength;Decreased range of motion;Decreased mobility;Decreased balance;Decreased coordination;Decreased knowledge of precautions;Decreased cognition;Decreased safety awareness;Decreased knowledge of  use of DME;Decreased activity tolerance       PT Treatment Interventions DME instruction;Gait training;Functional mobility training;Therapeutic activities;Therapeutic exercise;Stair training;Balance training;Neuromuscular re-education;Cognitive remediation;Patient/family education    PT Goals (Current goals can be found in the Care Plan section)  Acute Rehab PT Goals Patient Stated Goal: to return home PT Goal Formulation: With patient Time For Goal Achievement: 09/17/23 Potential to Achieve Goals: Good    Frequency Min 1X/week     Co-evaluation               AM-PAC PT "6 Clicks" Mobility  Outcome Measure Help needed turning from your back to your side while in a flat bed without using bedrails?: A Little Help needed moving from lying on your back to sitting on the side of a flat bed without using bedrails?: A Lot Help needed moving to and from a bed to a chair (including a wheelchair)?: A Lot Help needed standing up from a chair using your arms (e.g., wheelchair or bedside chair)?: A Lot Help needed to walk in hospital room?: A Lot Help needed climbing 3-5 steps with a railing? : A Lot 6 Click Score: 13    End of Session   Activity Tolerance: Patient tolerated treatment well Patient left: in chair;with call bell/phone within reach;with chair alarm set;with family/visitor present Nurse Communication: Mobility status PT Visit Diagnosis: Muscle weakness (generalized) (M62.81);Difficulty  in walking, not elsewhere classified (R26.2);Hemiplegia and hemiparesis Hemiplegia - Right/Left: Left Hemiplegia - dominant/non-dominant: Non-dominant Hemiplegia - caused by: Cerebral infarction    Time: 5409-8119 PT Time Calculation (min) (ACUTE ONLY): 24 min   Charges:   PT Evaluation $PT Eval High Complexity: 1 High   PT General Charges $$ ACUTE PT VISIT: 1 Visit         Michelle Wnek H. Manson Passey, PT, DPT, NCS 09/03/23, 2:10 PM [phone]

## 2023-09-03 NOTE — Progress Notes (Signed)
Occupational Therapy Evaluation Patient Details Name: James [name]. MRN: [id number] DOB: May 21, 1934 Today's Date: 09/03/2023   History of Present Illness presented to ER secondary to L-sided weakness; admitted for TIA/CVA work up.  Imaging significant for R thalamocapsular infarct, R occipital and L frontal infarcts.   Clinical Impression   Pt was seen for OT evaluation this date. Prior to hospital admission, pt was MOD I with ADLs and mobility using a RW/SPC . Pt lives in 2 level home, 1 STE with bed/bath on main level. Pt presents to acute OT demonstrating impaired ADL performance and functional mobility 2/2 L-sided weakness + inattention, impaired balance and limited insight into deficits (See OT problem list for additional functional deficits).   Additionally, fine + gross motor coordination impaired for bimanual ADL task performance. Delays in motor planning noted with oral care performed seated in recliner. Pt requires minA for brushing teeth from a seated position, has difficulties with dual task performance requiring frequent cuing for redirection, and will require modA for LB tasks. Anticipate +2 for transfers/further mobility (NT this date as lunch arriving and pt left upright in recliner). Stands with minA from recliner, multimodal cuing for upright posture as pt demos a forward trunk lean and downward gaze initially. Pt would benefit from skilled OT services to address noted impairments and functional limitations (see below for any additional details) in order to maximize safety and independence while minimizing falls risk and caregiver burden. Anticipate the need for follow up OT services upon acute hospital DC.        If plan is discharge home, recommend the following: Two people to help with walking and/or transfers;A lot of help with bathing/dressing/bathroom;Assistance with feeding;Assistance with cooking/housework;Direct supervision/assist for medications management;Direct  supervision/assist for financial management;Assist for transportation;Help with stairs or ramp for entrance;Supervision due to cognitive status    Functional Status Assessment  Patient has had a recent decline in their functional status and demonstrates the ability to make significant improvements in function in a reasonable and predictable amount of time.  Equipment Recommendations  None recommended by OT    Recommendations for Other Services Other (comment)     Precautions / Restrictions Precautions Precautions: Fall Restrictions Weight Bearing Restrictions: No      Mobility Bed Mobility               General bed mobility comments: NT - pt upright in recliner and left in this position to eat lunch    Transfers Overall transfer level: Needs assistance Equipment used: 1 person hand held assist Transfers: Sit to/from Stand, Bed to chair/wheelchair/BSC Sit to Stand: Min assist           General transfer comment: 3 x sit to stands performed from the recliner; multimodal cuing for postural control and upward gaze with pt improving each stand. Will need +2 for safety due to motor planning deficits      Balance Overall balance assessment: Needs assistance Sitting-balance support: No upper extremity supported, Feet supported Sitting balance-Leahy Scale: Poor Sitting balance - Comments: forward trunk flexion, cuing to orient to midline   Standing balance support: During functional activity Standing balance-Leahy Scale: Poor                             ADL either performed or assessed with clinical judgement   ADL Overall ADL's : Needs assistance/impaired     Grooming: Wash/dry hands;Wash/dry face;Oral care;Minimal assistance;Cueing for safety;Cueing for sequencing;Sitting Grooming  Details (indicate cue type and reason): cues for use of LUE (inattention noted)             Lower Body Dressing: Minimal assistance;Cueing for sequencing Lower Body  Dressing Details (indicate cue type and reason): dons and doffs socks, impaired bilateral coordination Toilet Transfer: Minimal assistance Toilet Transfer Details (indicate cue type and reason): simulated, pt stands with forward lean, requiring cues to correct         Functional mobility during ADLs: Cueing for sequencing;Cueing for safety General ADL Comments: Largest impairments to occupational performance are pt's motor planning, fine / gross coordination, balance and inattention to L hemibody. Pt requires minA for bimanual task coordination such as brushing teeth from a seated position, has difficulties with dual - task performance requiring frequent cuing for redirection, and will require modA for LB tasks. Anticipate +2 for transfers/further mobility (NT this date as lunch arriving and pt left upright in recliner).     Vision Baseline Vision/History: 1 Wears glasses Ability to See in Adequate Light: 0 Adequate Patient Visual Report: Blurring of vision (Able to read clock and name badge; however pt endorses L eye blurriness) Vision Assessment?: Wears glasses for reading;Vision impaired- to be further tested in functional context     Perception Perception: Impaired Preception Impairment Details: Inattention/Neglect Perception-Other Comments: L inattention   Praxis Praxis: Impaired Praxis Impairment Details: Motor planning     Pertinent Vitals/Pain Pain Assessment Pain Assessment: No/denies pain     Extremity/Trunk Assessment Upper Extremity Assessment Upper Extremity Assessment: Right hand dominant;LUE deficits/detail LUE Coordination: decreased gross motor;decreased fine motor (inattention to L, impaired digit opposition, drops items in L hand during functional tasks)   Lower Extremity Assessment Lower Extremity Assessment: Defer to PT evaluation (motor planning difficulties)   Cervical / Trunk Assessment Cervical / Trunk Assessment: Kyphotic   Communication  Communication Communication: No apparent difficulties   Cognition Arousal: Alert Behavior During Therapy: Flat affect Overall Cognitive Status: Difficult to assess                                 General Comments: Alert and oriented to basic information; follows simple commands.  Marked delay in processing/task initiation at times.  Noted inattention to L hemi-body (esp with dual-task activities)        Exercises Exercises: Other exercises Other Exercises Other Exercises: Edu of role/purpose of OT in acute environment, use of hemibody during functional tasks, and continnum of care   Shoulder Instructions      Home Living Family/patient expects to be discharged to:: Private residence Living Arrangements: Spouse/significant other Available Help at Discharge: Family Type of Home: House Home Access: Stairs to enter Secretary/administrator of Steps: 1   Home Layout: Two level;Able to live on main level with bedroom/bathroom Alternate Level Stairs-Number of Steps: no essential needs on upper level of home   Bathroom Shower/Tub: Walk-in shower         Home Equipment: Agricultural consultant (2 wheels);Cane - single point;Shower seat - built in;Shower seat          Prior Functioning/Environment Prior Level of Function : Independent/Modified Independent             Mobility Comments: Mod indep with RW (longer distances) versus SPC (shorter-distances); denies recent fall history.  No driving.  Was actively participating with HHPT prior to admission ADLs Comments: Mod indep with ADLs and light meal prep; does have aide that comes regularly  to assist patient/wife.        OT Problem List: Decreased strength;Decreased range of motion;Decreased activity tolerance;Impaired balance (sitting and/or standing);Impaired vision/perception;Decreased coordination;Decreased cognition;Decreased safety awareness;Impaired UE functional use      OT Treatment/Interventions:  Self-care/ADL training;Therapeutic exercise;Neuromuscular education;DME and/or AE instruction;Therapeutic activities;Visual/perceptual remediation/compensation;Patient/family education;Balance training    OT Goals(Current goals can be found in the care plan section) Acute Rehab OT Goals OT Goal Formulation: With patient/family Time For Goal Achievement: 09/17/23 Potential to Achieve Goals: Good  OT Frequency: Min 1X/week       AM-PAC OT "6 Clicks" Daily Activity     Outcome Measure Help from another person eating meals?: A Little Help from another person taking care of personal grooming?: A Little Help from another person toileting, which includes using toliet, bedpan, or urinal?: A Lot Help from another person bathing (including washing, rinsing, drying)?: A Lot Help from another person to put on and taking off regular upper body clothing?: A Lot Help from another person to put on and taking off regular lower body clothing?: A Lot 6 Click Score: 14   End of Session Equipment Utilized During Treatment: Gait belt Nurse Communication: Other (comment)  Activity Tolerance: Patient tolerated treatment well Patient left: in chair;with call bell/phone within reach;with chair alarm set  OT Visit Diagnosis: Hemiplegia and hemiparesis;Other abnormalities of gait and mobility (R26.89);Other symptoms and signs involving the nervous system (R29.898) Hemiplegia - Right/Left: Left Hemiplegia - dominant/non-dominant: Non-Dominant Hemiplegia - caused by: Cerebral infarction                Time: 1191-4782 OT Time Calculation (min): 36 min Charges:  OT General Charges $OT Visit: 1 Visit OT Evaluation $OT Eval High Complexity: 1 High OT Treatments $Neuromuscular Re-education: 8-22 mins  Daaiel Starlin L. Ruwayda Curet, OTR/L  09/03/23, 5:10 PM

## 2023-09-03 NOTE — Progress Notes (Signed)
Inpatient Rehab Admissions Coordinator Note:   Per therapy recommendations patient was screened for CIR candidacy by Stephania Fragmin, PT. At this time, pt appears to be a potential candidate for CIR. I will place an order for rehab consult for full assessment, per our protocol.  Please contact me any with questions.Estill Dooms, PT, DPT [phone] 09/03/23 4:33 PM

## 2023-09-03 NOTE — ED Notes (Signed)
Patient resting comfortably with eyes closed at this time. Respirations even and unlabored. NAD

## 2023-09-03 NOTE — Consult Note (Signed)
Neurology Consultation  Reason for Consult: Stroke Referring Physician: Dr. Fran Lowes  CC: Left sided weakness  History is obtained from: Patient, chart  HPI: James [name]. is a 89 y.o. male past medical history of prior TIA, anxiety, hypertension, hyperlipidemia not on statin, history of remote tobacco abuse, documented history of lacunar infarction with dysarthria and disequilibrium in his primary care note from 08/14/2023, polyneuropathy for which she follows with Dr. 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  Reports that he fell and hurt his left arm.  Since then his left arm has been weak.  He was unable to keep his remote in his hand.  He also had weakness on the left leg since then.  No more paresthesias than usual which are bilaterally symmetrical.  No headache.  No vision changes.  Denies chest pain.  Denies sick contacts. In the ED was evaluated with labs as well as imaging.  His MRI of the brain revealed right thalamic capsular infarct and 2 punctate infarcts in the right occipital lobe with a possible small subacute infarct in the left frontal white matter with chronic microvascular ischemic changes. His CT angio of the head showed occlusion versus critical stenosis of the right P2 PCA, severe distal left P2 stenosis, moderate stenosis of the nondominant left intradural vertebral artery at its dural margin, aneurysmal dilatation of the aortic arch and descending aorta.  Admitted for further workup   LKW: 3 to 4 days ago-unclear IV thrombolysis given?: no, outside the window EVT: No-outside the window Premorbid modified Rankin scale (mRS): Unable to reliably ascertain  ROS: Full ROS was performed and is negative except  as noted in the HPI.  Past Medical History:  Diagnosis Date   Anxiety    TIA (transient ischemic attack)    Family History  Problem Relation Age of Onset   Prostate cancer Father    Social History:   reports that he has quit smoking. His smoking use included cigarettes. He has never used smokeless tobacco. He reports that he does not currently use alcohol. He reports that he does not use drugs.  Medications  Current Facility-Administered Medications:     stroke: early stages of recovery book, , Does not apply, Once, Mansy, Jan A, MD   0.9 %  sodium chloride infusion, , Intravenous, Continuous, Mansy, Jan A, MD, Last Rate: 75 mL/hr at 09/02/23 2046, New Bag at 09/02/23 2046   acetaminophen (TYLENOL) tablet 650 mg, 650 mg, Oral, Q6H PRN **OR** acetaminophen (TYLENOL) suppository 650 mg, 650 mg, Rectal, Q6H PRN, Mansy, Jan A, MD   ALPRAZolam Prudy Feeler) tablet 1 mg, 1 mg, Oral, QHS PRN, Mansy, Jan A, MD, 1 mg at 09/02/23 2236   aspirin EC tablet 81 mg, 81 mg, Oral, Daily, Mansy, Jan A, MD, 81 mg at 09/02/23 2322   clopidogrel (PLAVIX) tablet 75 mg, 75 mg, Oral, Daily, Mansy, Jan A, MD   enoxaparin (LOVENOX) injection 40 mg, 40 mg, Subcutaneous, Q24H, Mansy, Jan A, MD, 40 mg at 09/02/23 2237   finasteride (PROSCAR) tablet 5 mg, 5 mg, Oral, Daily, Mansy, Jan A, MD   magnesium hydroxide (MILK OF MAGNESIA) suspension 30 mL, 30 mL, Oral, Daily PRN, Mansy, Jan A, MD   ondansetron (ZOFRAN) tablet 4 mg, 4 mg,  Oral, Q6H PRN **OR** ondansetron (ZOFRAN) injection 4 mg, 4 mg, Intravenous, Q6H PRN, Mansy, Jan A, MD   tamsulosin [facility]) capsule 0.4 mg, 0.4 mg, Oral, Daily, Mansy, Jan A, MD   traZODone (DESYREL) tablet 100 mg, 100 mg, Oral, QHS, Mansy, Jan A, MD, 100 mg at 09/02/23 2237   traZODone (DESYREL) tablet 25 mg, 25 mg, Oral, QHS PRN, Mansy, Vernetta Honey, MD  Exam: Current vital signs: BP 125/72 (BP Location: Right Arm)   Pulse 80   Temp 97.8 F (36.6 C) (Oral)   Resp 16   Ht 5\' 8"  (1.727 m)   Wt  72.6 kg   SpO2 96%   BMI 24.33 kg/m  Vital signs in last 24 hours: Temp:  [97.3 F (36.3 C)-98.4 F (36.9 C)] 97.8 F (36.6 C) (09/19 0912) Pulse Rate:  [42-88] 80 (09/19 0912) Resp:  [11-23] 16 (09/19 0912) BP: (93-168)/(50-102) 125/72 (09/19 0912) SpO2:  [94 %-100 %] 96 % (09/19 0912) Weight:  [72.6 kg] 72.6 kg (09/18 1221) General: Awake alert in no distress HEENT: Normocephalic atraumatic Lungs: Clear Cardiovascular: Regular rate rhythm Abdomen nondistended nontender Neurological exam Awake alert oriented to self the fact that he is in the hospital and the current month Mildly dysarthric speech No evidence of aphasia Cranial nerves II to XII intact Motor examination with left upper and lower extremity drift-lower extremity worse than upper extremity. Sensation intact Coordination intact on the right, difficult to assess on the left NIH stroke scale 1a Level of Conscious.: 0 1b LOC Questions: 0 1c LOC Commands: 0 2 Best Gaze: 0 3 Visual: 0 4 Facial Palsy: 0 5a Motor Arm - left: 1 5b Motor Arm - Right: 0 6a Motor Leg - Left: 2 6b Motor Leg - Right: 0 7 Limb Ataxia: 0 8 Sensory: 0 9 Best Language: 0 10 Dysarthria: 0 11 Extinct. and Inatten.: 0 TOTAL: 3   Labs I have reviewed labs in epic and the results pertinent to this consultation are:  CBC    Component Value Date/Time   WBC 5.5 09/03/2023 0428   RBC 4.24 09/03/2023 0428   HGB 12.8 (L) 09/03/2023 0428   HCT 38.4 (L) 09/03/2023 0428   PLT 163 09/03/2023 0428   MCV 90.6 09/03/2023 0428   MCH 30.2 09/03/2023 0428   MCHC 33.3 09/03/2023 0428   RDW 13.2 09/03/2023 0428   LYMPHSABS 1.1 09/02/2023 1227   MONOABS 0.5 09/02/2023 1227   EOSABS 0.1 09/02/2023 1227   BASOSABS 0.1 09/02/2023 1227    CMP     Component Value Date/Time   NA 136 09/03/2023 0428   K 3.9 09/03/2023 0428   CL 105 09/03/2023 0428   CO2 25 09/03/2023 0428   GLUCOSE 96 09/03/2023 0428   BUN 27 (H) 09/03/2023 0428   CREATININE  1.16 09/03/2023 0428   CALCIUM 8.6 (L) 09/03/2023 0428   PROT 9.0 (H) 09/02/2023 1227   ALBUMIN 4.3 09/02/2023 1227   AST 25 09/02/2023 1227   ALT 17 09/02/2023 1227   ALKPHOS 74 09/02/2023 1227   BILITOT 1.1 09/02/2023 1227   GFRNONAA >60 09/03/2023 0428   GFRAA >60 04/24/2018 1127    Lipid Panel     Component Value Date/Time   CHOL 199 09/03/2023 0428   TRIG 61 09/03/2023 0428   HDL 52 09/03/2023 0428   CHOLHDL 3.8 09/03/2023 0428   VLDL 12 09/03/2023 0428   LDLCALC 135 (H) 09/03/2023 0428    No results found for: "HGBA1C"  A1c pending  2D echo pending    Imaging I have reviewed the images obtained:  His MRI of the brain revealed right thalamic capsular infarct and 2 punctate infarcts in the right occipital lobe with a possible small subacute infarct in the left frontal white matter with chronic microvascular ischemic changes. His CT angio of the head showed occlusion versus critical stenosis of the right P2 PCA, severe distal left P2 stenosis, moderate stenosis of the nondominant left intradural vertebral artery at its dural margin, aneurysmal dilatation of the aortic arch and descending aorta.  Admitted for further workup  Assessment:  89 year old with right thalamic capsular infarct as well as punctate infarcts in the right occipital lobe with a questionable punctate focus in the left frontal lobe. With his severely diseased posterior circulation, etiology is likely large vessel disease with atheroembolism versus stenosis/occlusion but given the frontal lobe area of infarction, which means multiple vascular territories are involved, a cardioembolic source should also be evaluated.  Impression: Acute ischemic stroke-atheroembolic versus cardioembolic  Recommendations: Continue telemetry Continue frequent neurochecks No need for permissive hypertension Aspirin 81+ Plavix 75 for 3 months followed by aspirin only If A-fib is discovered on long-term monitoring, will need  to stop antiplatelets and be on an anticoagulant only from a neurological standpoint. High intensity statin for LDL goal less than 70.  He is currently at 135.  I would recommend at least atorvastatin 40 given his advanced age. 2D echocardiogram is pending at this time He may need outpatient cardiac monitoring if inpatient telemetry does not reveal atrial fibrillation due to concern for strokes in multiple vascular territories raising concern for cardioembolic etiology as the underlying cause PT OT Speech therapy  Preliminary plan discussed with Dr. Fran Lowes. Will follow-up on the pending studies.  -- Milon Dikes, MD Neurologist [facility] Pager: [phone]

## 2023-09-03 NOTE — Progress Notes (Signed)
PROGRESS NOTE    James [name].  MVH:[id number] DOB: [birth year] DOA: 09/02/2023 PCP: Marguarite Arbour, MD  106A/[id number]-BB  LOS: 0 days   Brief hospital course:   Assessment & Plan: Christoph Spaulding. is a 89 y.o. Caucasian male with medical history significant for anxiety and TIA, who presented to the emergency room with acute onset of left-sided weakness.  On Monday 9/16 he stated that he had the TV remote falling from his left hand and when he bent to get it he fell on his left side.  Has been feeling more weakness on the left upper than the left lower extremity since then.    * Acute thalamic infarction [facility]) --MRI of the brain revealed right thalamic capsular infarct and 2 punctate infarcts in the right occipital lobe with a possible small subacute infarct in the left frontal white matter with chronic microvascular ischemic changes. --CT angio of the head showed occlusion versus critical stenosis of the right P2 PCA, severe distal left P2 stenosis, moderate stenosis of the nondominant left intradural vertebral artery at its dural margin, aneurysmal dilatation of the aortic arch and descending aorta.  --given multiple vascular territories are involved, a cardioembolic source should also be evaluated.  Plan per neuro: Continue telemetry Continue frequent neurochecks No need for permissive hypertension Aspirin 81+ Plavix 75 for 3 months followed by aspirin only If A-fib is discovered, will need to stop antiplatelets and be on an anticoagulant only from a neurological standpoint. High intensity statin for LDL goal less than 70.  He is currently at 135.  Recommend atorvastatin 40 given his advanced age. He may need outpatient cardiac monitoring if inpatient telemetry does not reveal atrial fibrillation  PT/OT rec inpatient rehab  PAD (peripheral artery disease) (HCC) -cont home plavix --add ASA  Descending aortic aneurysm  --Incidental finding, measures up to 3.5 cm that  will need follow-up CT chest CTA.   --consider vascular surgery consult  Benign localized prostatic hyperplasia with lower urinary tract symptoms (LUTS) --cont Proscar and Flomax  Anxiety - continue home Xanax.   DVT prophylaxis: Lovenox SQ Code Status: DNR  Family Communication: son and daughter updated at bedside today Level of care: Telemetry Medical Dispo:   The patient is from: home Anticipated d/c is to: inpatient rehab Anticipated d/c date is: whenever bed available   Subjective and Interval History:  Pt reported weakness in his left arm and leg.     Objective: Vitals:   09/03/23 0630 09/03/23 0730 09/03/23 0912 09/03/23 1540  BP: 102/64 (!) 107/58 125/72 119/64  Pulse: 77 (!) 50 80 89  Resp: (!) 23 13 16 17   Temp:   97.8 F (36.6 C) 97.7 F (36.5 C)  TempSrc:   Oral Oral  SpO2: 96% 100% 96% 99%  Weight:      Height:        Intake/Output Summary (Last 24 hours) at 09/03/2023 2035 Last data filed at 09/03/2023 1507 Gross per 24 hour  Intake 1000 ml  Output --  Net 1000 ml   Filed Weights   09/02/23 1221  Weight: 72.6 kg    Examination:   Constitutional: NAD, AAOx3 HEENT: conjunctivae and lids normal, EOMI CV: No cyanosis.   RESP: normal respiratory effort, on RA Psych: Normal mood and affect.     Data Reviewed: I have personally reviewed labs and imaging studies  Time spent: 50 minutes  Darlin Priestly, MD [facility] If 7PM-7AM, please contact night-coverage 09/03/2023, 8:35 PM

## 2023-09-04 ENCOUNTER — Encounter: Payer: Self-pay | Admitting: Hospitalist

## 2023-09-04 DIAGNOSIS — I6381 Other cerebral infarction due to occlusion or stenosis of small artery: Secondary | ICD-10-CM | POA: Diagnosis not present

## 2023-09-04 DIAGNOSIS — N401 Enlarged prostate with lower urinary tract symptoms: Secondary | ICD-10-CM | POA: Diagnosis not present

## 2023-09-04 DIAGNOSIS — I639 Cerebral infarction, unspecified: Secondary | ICD-10-CM

## 2023-09-04 DIAGNOSIS — F419 Anxiety disorder, unspecified: Secondary | ICD-10-CM | POA: Diagnosis not present

## 2023-09-04 DIAGNOSIS — I739 Peripheral vascular disease, unspecified: Secondary | ICD-10-CM | POA: Diagnosis not present

## 2023-09-04 NOTE — Consult Note (Signed)
Physical Medicine and Rehabilitation Consult Reason for Consult: CVA Referring Physician: Arnetha Courser, MD   HPI: James [name]. is a 89 y.o. male who presented to the ER 2/2 left sided weakness and was admitted with R thalamocapsular, R occipital, and L frontal infarcts. Physical Medicine & Rehabilitation was consulted to assess candidacy for CIR.     ROS +left sided weakness Past Medical History:  Diagnosis Date   Anxiety    TIA (transient ischemic attack)    Past Surgical History:  Procedure Laterality Date   HAND SURGERY Left    HEMORRHOID SURGERY     TONSILLECTOMY AND ADENOIDECTOMY     Family History  Problem Relation Age of Onset   Prostate cancer Father    Social History:  reports that he has quit smoking. His smoking use included cigarettes. He has never used smokeless tobacco. He reports that he does not currently use alcohol. He reports that he does not use drugs. Allergies: No Known Allergies Medications Prior to Admission  Medication Sig Dispense Refill   ALPRAZolam (XANAX) 1 MG tablet TAKE 2 TABLETS (2 MG TOTAL) BY MOUTH NIGHTLY AS NEEDED FOR SLEEP     amitriptyline (ELAVIL) 25 MG tablet      clopidogrel (PLAVIX) 75 MG tablet      finasteride (PROSCAR) 5 MG tablet Take 1 tablet (5 mg total) by mouth daily. 90 tablet 3   imipramine (TOFRANIL) 50 MG tablet Take 50 mg by mouth at bedtime.     tamsulosin (FLOMAX) 0.4 MG CAPS capsule TAKE 1 CAPSULE EVERY DAY 90 capsule 3   traZODone (DESYREL) 100 MG tablet Take 100 mg by mouth at bedtime.      Home: Home Living Family/patient expects to be discharged to:: Private residence Living Arrangements: Spouse/significant other Available Help at Discharge: Family, Available 24 hours/day Type of Home: House Home Access: Stairs to enter [employer] of Steps: 1 Entrance Stairs-Rails: None Home Layout: Able to live on main level with bedroom/bathroom Alternate Level Stairs-Number of Steps: no  essential needs on upper level of home Bathroom Shower/Tub: Health visitor: Standard Bathroom Accessibility: Yes Home Equipment: Agricultural consultant (2 wheels), [employer] - single point, Information systems manager - built in, Information systems manager  Lives With: Spouse  Functional History: Prior Function Prior Level of Function : Independent/Modified Independent Mobility Comments: Mod indep with RW (longer distances) versus SPC (shorter-distances); denies recent fall history.  No driving.  Was actively participating with HHPT prior to admission ADLs Comments: Mod indep with ADLs and light meal prep; does have aide that comes regularly to assist patient/wife. Functional Status:  Mobility: Bed Mobility Overal bed mobility: Needs Assistance (sup via cueing to scoot to EOB to get feet on floor) Bed Mobility: Supine to Sit Supine to sit: Supervision General bed mobility comments: Pt needed SUP for supine to sit at EOB d/t needing to scoot forward to get BLEs on ground Transfers Overall transfer level: Needs assistance Equipment used: Rolling walker (2 wheels) Transfers: Sit to/from Stand Sit to Stand: Contact guard assist Bed to/from chair/wheelchair/BSC transfer type:: Stand pivot Stand pivot transfers: Mod assist, +2 physical assistance General transfer comment: Pt performed STS from EOB to RW with CGA, verb cues to push up from EOB with 1 hand versus pulling on RW. He ambulated ~20 feet using RW with CGA/MIN A for safety and pacing to sink to perform oral hygiene tasks then returned to recliner. Ambulation/Gait General Gait Details: deferred this date  ADL: ADL Overall ADL's : Needs assistance/impaired Grooming: Wash/dry face, Cueing for safety, Supervision/safety, Cueing for sequencing Grooming Details (indicate cue type and reason): Items placed on pt's L side and noted to compensate by turning his entire head to find items Upper Body Bathing: Set up Lower Body Dressing: Minimal assistance, Cueing  for sequencing Lower Body Dressing Details (indicate cue type and reason): dons and doffs socks, impaired bilateral coordination Toilet Transfer: Minimal assistance Toilet Transfer Details (indicate cue type and reason): simulated, pt stands with forward lean, requiring cues to correct Functional mobility during ADLs: Cueing for sequencing, Cueing for safety General ADL Comments: Items placed on pt's L side and noted to compensate by turning his entire head to find items; most notable deficits are pt's FMC, L sided inattention and balance/safety deficitis notable with pt requiring Min A d/t L sided lateral lean in standing and downward gaze with forward flexed posture. Pt needed unilateral support on counter for balance as well.  Cognition: Cognition Overall Cognitive Status: Difficult to assess Orientation Level: Oriented X4 Cognition Arousal: Alert Behavior During Therapy: WFL for tasks assessed/performed Overall Cognitive Status: Difficult to assess General Comments: Alert and oriented to basic information; follows simple commands.  Marked delay in processing/task initiation at times.  Noted inattention to L hemi-body (esp with dual-task activities) Difficult to assess due to: Hard of hearing/deaf  Blood pressure 127/77, pulse 88, temperature 97.6 F (36.4 C), temperature source Oral, resp. rate 16, height 5\' 8"  (1.727 m), weight 72.6 kg, SpO2 99%. Physical Exam Gen: no distress, normal appearing HEENT: oral mucosa pink and moist, NCAT Cardio: Reg rate Chest: normal effort, normal rate of breathing Abd: soft, non-distended Ext: no edema Psych: pleasant, normal affect Skin: intact Neuro: left sided weakness with drift, sensation intact  No results found for this or any previous visit (from the past 24 hour(s)). ECHOCARDIOGRAM COMPLETE BUBBLE STUDY  Result Date: 09/03/2023    ECHOCARDIOGRAM REPORT   Patient Name:   James [name]. Date of Exam: 09/03/2023 Medical Rec #:   [id number]              Height:       68.0 in Accession #:    [id number]             Weight:       160.0 lb Date of Birth:  August 20, 1934              BSA:          1.859 m Patient Age:    89 years               BP:           107/58 mmHg Patient Gender: M                      HR:           49 bpm. Exam Location:  [facility] Procedure: 2D Echo, Cardiac Doppler, Color Doppler and Saline Contrast Bubble            Study Indications:     Stroke  History:         Patient has no prior history of Echocardiogram examinations.                  Stroke and PAD; Risk Factors:Hypertension and Dyslipidemia.  Sonographer:     Mikki Harbor Referring Phys:  [id number] JAN A MANSY Diagnosing Phys: Julien Nordmann MD  Sonographer Comments:  Technically difficult study due to poor echo windows, suboptimal apical window and no subcostal window. Image acquisition challenging due to patient body habitus and Image acquisition challenging due to respiratory motion. IMPRESSIONS  1. Left ventricular ejection fraction, by estimation, is 55 to 60%. The left ventricle has normal function. The left ventricle has no regional wall motion abnormalities. Left ventricular diastolic parameters are consistent with Grade I diastolic dysfunction (impaired relaxation).  2. Right ventricular systolic function is normal. The right ventricular size is normal. There is normal pulmonary artery systolic pressure.  3. The mitral valve is normal in structure. No evidence of mitral valve regurgitation. No evidence of mitral stenosis.  4. The aortic valve has an indeterminant number of cusps. Aortic valve regurgitation is not visualized. Aortic valve sclerosis is present, with no evidence of aortic valve stenosis.  5. The inferior vena cava is normal in size with greater than 50% respiratory variability, suggesting right atrial pressure of 3 mmHg.  6. Agitated saline contrast bubble study was negative, with no evidence of any interatrial shunt. FINDINGS  Left Ventricle: Left  ventricular ejection fraction, by estimation, is 55 to 60%. The left ventricle has normal function. The left ventricle has no regional wall motion abnormalities. The left ventricular internal cavity size was normal in size. There is  no left ventricular hypertrophy. Left ventricular diastolic parameters are consistent with Grade I diastolic dysfunction (impaired relaxation). Right Ventricle: The right ventricular size is normal. No increase in right ventricular wall thickness. Right ventricular systolic function is normal. There is normal pulmonary artery systolic pressure. The tricuspid regurgitant velocity is 1.71 m/s, and  with an assumed right atrial pressure of 3 mmHg, the estimated right ventricular systolic pressure is 14.7 mmHg. Left Atrium: Left atrial size was normal in size. Right Atrium: Right atrial size was normal in size. Pericardium: There is no evidence of pericardial effusion. Mitral Valve: The mitral valve is normal in structure. No evidence of mitral valve regurgitation. No evidence of mitral valve stenosis. MV peak gradient, 2.5 mmHg. The mean mitral valve gradient is 1.0 mmHg. Tricuspid Valve: The tricuspid valve is normal in structure. Tricuspid valve regurgitation is not demonstrated. No evidence of tricuspid stenosis. Aortic Valve: The aortic valve has an indeterminant number of cusps. Aortic valve regurgitation is not visualized. Aortic valve sclerosis is present, with no evidence of aortic valve stenosis. Aortic valve mean gradient measures 2.0 mmHg. Aortic valve peak gradient measures 2.8 mmHg. Aortic valve area, by VTI measures 2.09 cm. Pulmonic Valve: The pulmonic valve was normal in structure. Pulmonic valve regurgitation is not visualized. No evidence of pulmonic stenosis. Aorta: The aortic root is normal in size and structure. Venous: The inferior vena cava is normal in size with greater than 50% respiratory variability, suggesting right atrial pressure of 3 mmHg. IAS/Shunts: No  atrial level shunt detected by color flow Doppler. Agitated saline contrast was given intravenously to evaluate for intracardiac shunting. Agitated saline contrast bubble study was negative, with no evidence of any interatrial shunt. There  is no evidence of a patent foramen ovale. There is no evidence of an atrial septal defect.  LEFT VENTRICLE PLAX 2D LVIDd:         3.90 cm   Diastology LVIDs:         2.60 cm   LV e' medial:    7.18 cm/s LV PW:         1.00 cm   LV E/e' medial:  7.8 LV IVS:  1.10 cm   LV e' lateral:   9.90 cm/s LVOT diam:     1.90 cm   LV E/e' lateral: 5.7 LV SV:         49 LV SV Index:   26 LVOT Area:     2.84 cm  RIGHT VENTRICLE RV Basal diam:  2.90 cm RV Mid diam:    3.20 cm RV S prime:     13.40 cm/s TAPSE (M-mode): 2.2 cm LEFT ATRIUM           Index        RIGHT ATRIUM           Index LA diam:      3.30 cm 1.78 cm/m   RA Area:     15.60 cm LA Vol (A4C): 36.1 ml 19.42 ml/m  RA Volume:   36.50 ml  19.64 ml/m  AORTIC VALVE                    PULMONIC VALVE AV Area (Vmax):    2.18 cm     PV Vmax:       0.76 m/s AV Area (Vmean):   2.01 cm     PV Peak grad:  2.3 mmHg AV Area (VTI):     2.09 cm AV Vmax:           84.10 cm/s AV Vmean:          61.800 cm/s AV VTI:            0.233 m AV Peak Grad:      2.8 mmHg AV Mean Grad:      2.0 mmHg LVOT Vmax:         64.70 cm/s LVOT Vmean:        43.800 cm/s LVOT VTI:          0.172 m LVOT/AV VTI ratio: 0.74  AORTA Ao Root diam: 3.40 cm Ao Asc diam:  3.00 cm MITRAL VALVE               TRICUSPID VALVE MV Area (PHT): 3.56 cm    TR Peak grad:   11.7 mmHg MV Area VTI:   1.77 cm    TR Vmax:        171.00 cm/s MV Peak grad:  2.5 mmHg MV Mean grad:  1.0 mmHg    SHUNTS MV Vmax:       0.78 m/s    Systemic VTI:  0.17 m MV Vmean:      37.0 cm/s   Systemic Diam: 1.90 cm MV Decel Time: 213 msec MV E velocity: 56.10 cm/s MV A velocity: 58.70 cm/s MV E/A ratio:  0.96 Julien Nordmann MD Electronically signed by Julien Nordmann MD Signature Date/Time:  09/03/2023/6:17:41 PM    Final    CT ANGIO HEAD NECK W WO CM  Result Date: 09/02/2023 CLINICAL DATA:  Stroke/TIA, determine embolic source EXAM: CT ANGIOGRAPHY HEAD AND NECK WITH AND WITHOUT CONTRAST TECHNIQUE: Multidetector CT imaging of the head and neck was performed using the standard protocol during bolus administration of intravenous contrast. Multiplanar CT image reconstructions and MIPs were obtained to evaluate the vascular anatomy. Carotid stenosis measurements (when applicable) are obtained utilizing NASCET criteria, using the distal internal carotid diameter as the denominator. RADIATION DOSE REDUCTION: This exam was performed according to the departmental dose-optimization program which includes automated exposure control, adjustment of the mA and/or kV according to patient size and/or use of iterative reconstruction technique. CONTRAST:  75mL  OMNIPAQUE IOHEXOL 350 MG/ML SOLN COMPARISON:  Same day CT head and MRI head. FINDINGS: CTA NECK FINDINGS Aortic arch: Great vessel origins are patent. Right carotid system: Atherosclerosis at the carotid bifurcation without greater than 50% stenosis. Left carotid system: Atherosclerosis at the carotid bifurcation without greater than 50% stenosis. Vertebral arteries: Right dominant. Rim both vertebral arteries are patent without significant (greater than 50%) stenosis in the neck. Skeleton: No acute abnormality on limited assessment. Other neck: No acute abnormality on limited assessment. 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 Review of the MIP images confirms the above findings CTA HEAD FINDINGS Anterior circulation: Bilateral intracranial ICAs, Posterior circulation: Moderate stenosis of the non dominant left intradural vertebral artery at its dural margin. The dominant right vertebral artery and basilar artery are patent without significant stenosis. Occlusion versus critical stenosis of the right P2 PCA  with poor/irregular distal opacification. Severe distal left P2 PCA stenosis. Venous sinuses: As permitted by contrast timing, patent. Review of the MIP images confirms the above findings IMPRESSION: 1. Occlusion versus critical stenosis of the right P2 PCA with poor/irregular distal opacification. 2. Severe distal left P2 PCA stenosis. 3. Moderate stenosis of the non dominant left intradural vertebral artery at its dural margin. 4. 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 A follow-up CTA of the chest is recommended to fully characterize and to determine follow-up recommendations. Findings discussed with Dr. Lenard Lance via telephone at 9:24 p.m. Electronically Signed   By: Feliberto Harts M.D.   On: 09/02/2023 21:26   MR BRAIN WO CONTRAST  Result Date: 09/02/2023 CLINICAL DATA:  Neuro deficit, acute, stroke suspected acute right EXAM: MRI HEAD WITHOUT CONTRAST TECHNIQUE: Multiplanar, multiecho pulse sequences of the brain and surrounding structures were obtained without intravenous contrast. COMPARISON:  Same day CT head. FINDINGS: Brain: Acute right thalamocapsular infarct. Mild edema without mass effect. Two punctate acute infarcts in the right occipital lobe and possible small subacute infarct in the left frontal white matter. Additional scattered T2/FLAIR hyperintensities in the white matter, compatible with chronic microvascular ischemic change. No evidence acute hemorrhage, mass lesion, midline shift or hydrocephalus. Cerebral atrophy. Vascular: Major arterial flow voids are maintained skull base. Skull and upper cervical spine: Normal marrow signal. Sinuses/Orbits: Clear sinuses.  No acute orbital findings. Other: No mastoid effusions. IMPRESSION: 1. Acute right thalamocapsular infarct. 2. Two punctate acute infarcts in the right occipital lobe and possible small subacute infarct in the left frontal white matter. 3. Chronic microvascular ischemic disease.  Electronically Signed   By: Feliberto Harts M.D.   On: 09/02/2023 18:52    Assessment/Plan: Diagnosis: R thalamaopsular, occipital and L frontal infarcts.  Does the need for close, 24 hr/day medical supervision in concert with the patient's rehab needs make it unreasonable for this patient to be served in a less intensive setting? Yes Co-Morbidities requiring supervision/potential complications:  1) Anxiety: continue trazodone 2) BPH: continue flomax 3) PAD 4) Descending aortic arch aneurysm 5) GERD: provide dietary education Due to bladder management, bowel management, safety, skin/wound care, disease management, medication administration, pain management, and patient education, does the patient require 24 hr/day rehab nursing? Yes Does the patient require coordinated care of a physician, rehab nurse, therapy disciplines of PT, OT to address physical and functional deficits in the context of the above medical diagnosis(es)? Yes Addressing deficits in the following areas: balance, endurance, locomotion, strength, transferring, bowel/bladder control, bathing, dressing, feeding, grooming,  toileting, and language Can the patient actively participate in an intensive therapy program of at least 3 hrs of therapy per day at least 5 days per week? Yes The potential for patient to make measurable gains while on inpatient rehab is excellent Anticipated functional outcomes upon discharge from inpatient rehab are modified independent  with PT, modified independent with OT, modified independent with SLP. Estimated rehab length of stay to reach the above functional goals is: 10-14 days Anticipated discharge destination: Home Overall Rehab/Functional Prognosis: excellent  POST ACUTE RECOMMENDATIONS: This patient's condition is appropriate for continued rehabilitative care in the following setting: CIR Patient has agreed to participate in recommended program. Yes Note that insurance prior authorization may  be required for reimbursement for recommended care.   I have personally performed a face to face diagnostic evaluation of this patient. Additionally, I have examined the patient's medical record including any pertinent labs and radiographic images. If the physician assistant has documented in this note, I have reviewed and edited or otherwise concur with the physician assistant's documentation.  Thanks,  Horton Chin, MD 09/04/2023

## 2023-09-04 NOTE — TOC Progression Note (Signed)
Transition of Care (TOC) - Progression Note    Patient Details  Name: James [name]. MRN: [id number] Date of Birth: Jun 25, 1934  Transition of Care [facility]) CM/SW Contact  Allena Katz, LCSW Phone Number: 09/04/2023, 3:04 PM  Clinical Narrative:   CSW spoke with daughter margaret who reports she would like assistance at home for her dad for when he discharges home from CIR. Claris Che reports she has no preference on agency. She states she will do research online but would also like a referral to [facility]. [facility]erral sent to [facility] with [facility].     Expected Discharge Plan: IP Rehab Facility Barriers to Discharge: Continued Medical Work up  Expected Discharge Plan and Services                                               Social Determinants of Health (SDOH) Interventions SDOH Screenings   Food Insecurity: No Food Insecurity (09/03/2023)  Housing: Low Risk  (09/03/2023)  Transportation Needs: No Transportation Needs (09/03/2023)  Utilities: Not At Risk (09/03/2023)  Tobacco Use: Medium Risk (09/04/2023)    Readmission Risk Interventions     No data to display

## 2023-09-04 NOTE — Progress Notes (Signed)
Occupational Therapy Treatment Patient Details Name: James [name]. MRN: [id number] DOB: 08/04/1934 Today's Date: 09/04/2023   History of present illness 89 y/o male presented to ER secondary to L-sided weakness; admitted for TIA/CVA work up.  Imaging significant for R thalamocapsular infarct, R occipital and L frontal infarcts.   OT comments  Pt is supine in bed with HOB elevated on entry to room and agreeable to OT session. He denies pain. Pt demo supine to sit at EOB d/t needing to scoot forward to get BLEs on ground. Pt performed STS from EOB to RW with CGA, verb cues to push up from EOB with 1 hand versus pulling on RW. He ambulated ~20 feet using RW with CGA/MIN A for safety and pacing to sink to perform oral hygiene tasks then returned to recliner. Items placed on pt's L side and noted to compensate by turning his entire head to find items. His most notable deficits are pt's FMC, L sided inattention and balance/safety deficitis notable with pt requiring Min A d/t L sided lateral lean in standing and downward gaze with forward flexed posture. Pt needed unilateral support on counter for balance as well. He was left seated in recliner with PT and family members present. He will continue to benefit from skilled acute OT services to continue progressing towards his goals to return to PLOF with ADL/IADL performance.       If plan is discharge home, recommend the following:  Assistance with feeding;Assistance with cooking/housework;Direct supervision/assist for medications management;Direct supervision/assist for financial management;Assist for transportation;Help with stairs or ramp for entrance;Supervision due to cognitive status;A little help with bathing/dressing/bathroom;A little help with walking and/or transfers   Equipment Recommendations  None recommended by OT    Recommendations for Other Services      Precautions / Restrictions Precautions Precautions:  Fall Restrictions Weight Bearing Restrictions: No       Mobility Bed Mobility Overal bed mobility: Needs Assistance (sup via cueing to scoot to EOB to get feet on floor) Bed Mobility: Supine to Sit     Supine to sit: Supervision     General bed mobility comments: Pt needed SUP for supine to sit at EOB d/t needing to scoot forward to get BLEs on ground    Transfers Overall transfer level: Needs assistance Equipment used: Rolling walker (2 wheels) Transfers: Sit to/from Stand Sit to Stand: Contact guard assist           General transfer comment: Pt performed STS from EOB to RW with CGA, verb cues to push up from EOB with 1 hand versus pulling on RW. He ambulated ~20 feet using RW with CGA/MIN A for safety and pacing to sink to perform oral hygiene tasks then returned to recliner.     Balance Overall balance assessment: Needs assistance Sitting-balance support: Bilateral upper extremity supported, Feet unsupported Sitting balance-Leahy Scale: Poor Sitting balance - Comments: forward trunk flexion, cuing to orient to midline Postural control: Left lateral lean Standing balance support: During functional activity Standing balance-Leahy Scale: Poor                             ADL either performed or assessed with clinical judgement   ADL Overall ADL's : Needs assistance/impaired     Grooming: Wash/dry face;Cueing for safety;Supervision/safety;Cueing for sequencing Grooming Details (indicate cue type and reason): Items placed on pt's L side and noted to compensate by turning his entire head to find items Upper  Body Bathing: Set up                             General ADL Comments: Items placed on pt's L side and noted to compensate by turning his entire head to find items; most notable deficits are pt's FMC, L sided inattention and balance/safety deficitis notable with pt requiring Min A d/t L sided lateral lean in standing and downward gaze with  forward flexed posture. Pt needed unilateral support on counter for balance as well.    Extremity/Trunk Assessment Upper Extremity Assessment LUE Coordination: decreased gross motor;decreased fine motor (inattention to L, did not drop items during tx, but difficulty with FM task of opening toothpaste)            Vision Baseline Vision/History: 1 Wears glasses Ability to See in Adequate Light: 0 Adequate Patient Visual Report: Blurring of vision Vision Assessment?: Wears glasses for reading;Vision impaired- to be further tested in functional context Additional Comments: L sided inattention/neglect-pt compensating by turning his entire head to the left to attend   Perception Perception Perception: Impaired Preception Impairment Details: Inattention/Neglect Perception-Other Comments: L sided inattention   Praxis Praxis Praxis: Impaired Praxis Impairment Details: Motor planning    Cognition Arousal: Alert Behavior During Therapy: WFL for tasks assessed/performed Overall Cognitive Status: Difficult to assess                                 General Comments: Alert and oriented to basic information; follows simple commands.  Marked delay in processing/task initiation at times.  Noted inattention to L hemi-body (esp with dual-task activities)        Exercises      Shoulder Instructions       General Comments      Pertinent Vitals/ Pain       Pain Assessment Pain Assessment: No/denies pain  Home Living   Living Arrangements: Spouse/significant other Available Help at Discharge: Family;Available 24 hours/day Type of Home: House Home Access: Stairs to enter [employer] of Steps: 1 Entrance Stairs-Rails: None Home Layout: Able to live on main level with bedroom/bathroom     Bathroom Shower/Tub: Producer, television/film/video: Standard Bathroom Accessibility: Yes How Accessible: Accessible via walker        Lives With: Spouse     Prior Functioning/Environment              Frequency  Min 1X/week        Progress Toward Goals  OT Goals(current goals can now be found in the care plan section)  Progress towards OT goals: Progressing toward goals  Acute Rehab OT Goals OT Goal Formulation: With patient/family Time For Goal Achievement: 09/18/23 Potential to Achieve Goals: Good  Plan      Co-evaluation                 AM-PAC OT "6 Clicks" Daily Activity     Outcome Measure   Help from another person eating meals?: A Little Help from another person taking care of personal grooming?: A Little Help from another person toileting, which includes using toliet, bedpan, or urinal?: A Lot Help from another person bathing (including washing, rinsing, drying)?: A Lot Help from another person to put on and taking off regular upper body clothing?: A Little Help from another person to put on and taking off regular lower body clothing?: A Lot  6 Click Score: 15    End of Session Equipment Utilized During Treatment: Gait belt;Rolling walker (2 wheels)  OT Visit Diagnosis: Hemiplegia and hemiparesis;Other abnormalities of gait and mobility (R26.89);Other symptoms and signs involving the nervous system (R29.898) Hemiplegia - Right/Left: Left Hemiplegia - dominant/non-dominant: Non-Dominant Hemiplegia - caused by: Cerebral infarction   Activity Tolerance     Patient Left in chair;with call bell/phone within reach;with chair alarm set   Nurse Communication  (nursing student took vitals and provided washcloth)        Time: 7035-0093 OT Time Calculation (min): 19 min  Charges: OT General Charges $OT Visit: 1 Visit OT Treatments $Self Care/Home Management : 8-22 mins  Zeferino Mounts, OTR/L 09/04/2023, 3:43 PM

## 2023-09-04 NOTE — TOC Initial Note (Signed)
Transition of Care (TOC) - Initial/Assessment Note    Patient Details  Name: James [name]. MRN: [id number] Date of Birth: [birth year]  Transition of Care [facility]) CM/SW Contact:    Allena Katz, LCSW Phone Number: 09/04/2023, 11:02 AM  Clinical Narrative:   CIR to do assessment for patient. TOC following.                 Expected Discharge Plan: IP Rehab Facility Barriers to Discharge: Continued Medical Work up   Patient Goals and CMS Choice Patient states their goals for this hospitalization and ongoing recovery are:: go to CIR   Choice offered to / list presented to : Patient      Expected Discharge Plan and Services                                              Prior Living Arrangements/Services                       Activities of Daily Living Home Assistive Devices/Equipment: Eyeglasses ADL Screening (condition at time of admission) Patient's cognitive ability adequate to safely complete daily activities?: Yes Is the patient deaf or have difficulty hearing?: No Does the patient have difficulty seeing, even when wearing glasses/contacts?: No Does the patient have difficulty concentrating, remembering, or making decisions?: No Patient able to express need for assistance with ADLs?: Yes Does the patient have difficulty dressing or bathing?: No Independently performs ADLs?: Yes (appropriate for developmental age) Does the patient have difficulty walking or climbing stairs?: Yes Weakness of Legs: Left Weakness of Arms/Hands: Left  Permission Sought/Granted                  Emotional Assessment       Orientation: : Fluctuating Orientation (Suspected and/or reported Sundowners)      Admission diagnosis:  Acute thalamic infarction (HCC) [I63.81] Fall, initial encounter [W19.XXXA] Acute CVA (cerebrovascular accident) (HCC) [I63.9] Stroke [facility]) [I63.9] Patient Active Problem List   Diagnosis Date Noted   Stroke (HCC) 09/03/2023    Acute thalamic infarction (HCC) 09/02/2023   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   PAD (peripheral artery disease) (HCC) 11/29/2019   Polyneuropathy 04/27/2019   Anxiety 07/05/2014   GERD (gastroesophageal reflux disease) 07/05/2014   HTN (hypertension) 07/05/2014   Hyperlipidemia 07/05/2014   Late effect of lacunar infarction 07/05/2014   OA (osteoarthritis) 07/05/2014   Benign localized prostatic hyperplasia with lower urinary tract symptoms (LUTS) 12/23/2012   Family history of malignant neoplasm of prostate 12/23/2012   Incomplete emptying of bladder 12/23/2012   Kidney stone 12/23/2012   PCP:  Marguarite Arbour, MD Pharmacy:   [facility] [phone] [place], [place] - [address] [place] [address] Phone: [phone] Fax: [phone]  [facility] - [place], [place] - [address] [place] [address] Phone: [phone] Fax: [phone]     Social Determinants of Health (SDOH) Social History: SDOH Screenings   Food Insecurity: No Food Insecurity (09/03/2023)  Housing: Low Risk  (09/03/2023)  Transportation Needs: No Transportation Needs (09/03/2023)  Utilities: Not At Risk (09/03/2023)  Tobacco Use: Medium Risk (09/04/2023)   SDOH Interventions:     Readmission Risk Interventions     No data to display

## 2023-09-04 NOTE — Progress Notes (Signed)
PROGRESS NOTE    James [name].  ZOX:[id number] DOB: [birth year] DOA: 09/02/2023 PCP: Marguarite Arbour, MD  106A/106A-BB  LOS: 1 day   Brief hospital course:   Assessment & Plan: James [name]. is a 89 y.o. Caucasian male with medical history significant for anxiety and TIA, who presented to the emergency room with acute onset of left-sided weakness.  On Monday 9/16 he stated that he had the TV remote falling from his left hand and when he bent to get it he fell on his left side.  Has been feeling more weakness on the left upper than the left lower extremity since then.   Patient was found to have right thalamic capsular infarct, 2 punctate infarcts in the right occipital lobe and a possible small subacute infarct in the left frontal white matter with chronic microvascular ischemic changes.  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   Given multiple vascular territories are involved, a cardioembolic source should also be evaluated.  9/20: Vital stable.  Echocardiogram with normal EF, grade 1 diastolic dysfunction, no other significant abnormality and no evidence of any intra-arterial shunt. Lipid panel with LDL of 135, goal is less than 70-patient was started on statin. Neurology is recommending aspirin and Plavix for 3 months followed by aspirin only. PT is recommending CIR.   * Acute thalamic infarction [facility]) --MRI of the brain revealed right thalamic capsular infarct and 2 punctate infarcts in the right occipital lobe with a possible small subacute infarct in the left frontal white matter with chronic microvascular ischemic changes. --CT angio of the head showed occlusion versus critical stenosis of the right P2 PCA, severe distal left P2  stenosis, moderate stenosis of the nondominant left intradural vertebral artery at its dural margin, aneurysmal dilatation of the aortic arch and descending aorta.  --given multiple vascular territories are involved, a cardioembolic source should also be evaluated.  Plan per neuro: Continue telemetry Continue frequent neurochecks No need for permissive hypertension Aspirin 81+ Plavix 75 for 3 months followed by aspirin only If A-fib is discovered, no A-fib detected so far, will need to stop antiplatelets and be on an anticoagulant only from a neurological standpoint. High intensity statin for LDL goal less than 70.  He is currently at 135.  Recommend atorvastatin 40 given his advanced age. He may need outpatient cardiac monitoring if inpatient telemetry does not reveal atrial fibrillation  PT/OT rec inpatient rehab-approved for CIR, pending insurance approval and bed availability  PAD (peripheral artery disease) (HCC) -cont home plavix --add ASA  Descending aortic aneurysm  --Incidental finding, measures up to 3.5 cm that will need follow-up CT chest CTA.   --consider vascular surgery consult  Benign localized prostatic hyperplasia with lower urinary tract symptoms (LUTS) --cont Proscar and Flomax  Anxiety - continue home Xanax.   DVT prophylaxis: Lovenox SQ Code Status: DNR  Family Communication: son and daughter updated at bedside . Level of care: Telemetry Medical Dispo:   The patient is from: home Anticipated d/c is to: inpatient rehab Anticipated d/c date is: whenever bed available   Subjective and Interval History:  Patient was seen and examined today.  No new deficit.  Continue to have some tingling of left hand fingers, strength seems fine.    Objective: Vitals:  09/04/23 0027 09/04/23 0618 09/04/23 0745 09/04/23 1152  BP: 100/72 (!) 148/89 (!) 144/84 127/77  Pulse: 83 84 81 88  Resp: 16 18 14 16   Temp: 97.7 F (36.5 C) 98.2 F (36.8 C) 97.6 F (36.4 C)    TempSrc: Oral  Oral   SpO2: 95% 97% 98% 99%  Weight:      Height:       No intake or output data in the 24 hours ending 09/04/23 1602  Filed Weights   09/02/23 1221  Weight: 72.6 kg    Examination:   General.  Well-developed elderly man, in no acute distress. Pulmonary.  Lungs clear bilaterally, normal respiratory effort. CV.  Regular rate and rhythm, no JVD, rub or murmur. Abdomen.  Soft, nontender, nondistended, BS positive. CNS.  Alert and oriented .  No focal neurologic deficit. Extremities.  No edema, no cyanosis, pulses intact and symmetrical. Psychiatry.  Judgment and insight appears normal.     Data Reviewed: I have personally reviewed labs and imaging studies  Time spent: 45 minutes  This record has been created using Conservation officer, historic buildings. Errors have been sought and corrected,but may not always be located. Such creation errors do not reflect on the standard of care.   Arnetha Courser, MD [facility] If 7PM-7AM, please contact night-coverage 09/04/2023, 4:02 PM

## 2023-09-04 NOTE — Plan of Care (Signed)
  Problem: Education: Goal: Knowledge of disease or condition will improve Outcome: Progressing Goal: Knowledge of secondary prevention will improve (MUST DOCUMENT ALL) Outcome: Progressing Goal: Knowledge of patient specific risk factors will improve Loraine Leriche N/A or DELETE if not current risk factor) Outcome: Progressing   Problem: Ischemic Stroke/TIA Tissue Perfusion: Goal: Complications of ischemic stroke/TIA will be minimized Outcome: Progressing   Problem: Coping: Goal: Will verbalize positive feelings about self Outcome: Adequate for Discharge Goal: Will identify appropriate support needs Outcome: Adequate for Discharge

## 2023-09-04 NOTE — Progress Notes (Signed)
Neurology Progress Note   S:// Seen and examined No acute changes reported overnight   O:// Current vital signs: BP (!) 144/84 (BP Location: Left Arm)   Pulse 81   Temp 97.6 F (36.4 C) (Oral)   Resp 14   Ht 5\' 8"  (1.727 m)   Wt 72.6 kg   SpO2 98%   BMI 24.33 kg/m  Vital signs in last 24 hours: Temp:  [97.6 F (36.4 C)-98.2 F (36.8 C)] 97.6 F (36.4 C) (09/20 0745) Pulse Rate:  [81-89] 81 (09/20 0745) Resp:  [14-18] 14 (09/20 0745) BP: (100-148)/(64-89) 144/84 (09/20 0745) SpO2:  [95 %-99 %] 98 % (09/20 0745) General: Awake alert eating breakfast in bed HEENT: Normocephalic/atraumatic Lungs clear Cardiovascular: Regular rhythm Neurological exam Awake alert oriented x 2 Mildly dysarthric No evidence of aphasia Cranials 2-12 intact Motor examination with left-sided drift Sensation intact Coordination intact on the right, difficult to assess on the left. Unchanged exam from yesterday   Medications  Current Facility-Administered Medications:    acetaminophen (TYLENOL) tablet 650 mg, 650 mg, Oral, Q6H PRN **OR** acetaminophen (TYLENOL) suppository 650 mg, 650 mg, Rectal, Q6H PRN, Mansy, Vernetta Honey, MD   ALPRAZolam Prudy Feeler) tablet 1 mg, 1 mg, Oral, BID PRN, Darlin Priestly, MD, 1 mg at 09/03/23 2203   aspirin EC tablet 81 mg, 81 mg, Oral, Daily, Mansy, Jan A, MD, 81 mg at 09/04/23 0907   atorvastatin (LIPITOR) tablet 40 mg, 40 mg, Oral, Daily, Jaynie Bream, RPH, 40 mg at 09/04/23 1610   clopidogrel (PLAVIX) tablet 75 mg, 75 mg, Oral, Daily, Mansy, Jan A, MD, 75 mg at 09/04/23 0907   enoxaparin (LOVENOX) injection 40 mg, 40 mg, Subcutaneous, Q24H, Mansy, Jan A, MD, 40 mg at 09/02/23 2237   finasteride (PROSCAR) tablet 5 mg, 5 mg, Oral, Daily, Mansy, Jan A, MD, 5 mg at 09/04/23 0907   magnesium hydroxide (MILK OF MAGNESIA) suspension 30 mL, 30 mL, Oral, Daily PRN, Mansy, Jan A, MD   ondansetron (ZOFRAN) tablet 4 mg, 4 mg, Oral, Q6H PRN **OR** ondansetron (ZOFRAN) injection 4  mg, 4 mg, Intravenous, Q6H PRN, Mansy, Jan A, MD   tamsulosin (FLOMAX) capsule 0.4 mg, 0.4 mg, Oral, Daily, Mansy, Jan A, MD, 0.4 mg at 09/04/23 0907   traZODone (DESYREL) tablet 100 mg, 100 mg, Oral, QHS, Mansy, Jan A, MD, 100 mg at 09/02/23 2237   traZODone (DESYREL) tablet 25 mg, 25 mg, Oral, QHS PRN, Mansy, Jan A, MD  Labs CBC    Component Value Date/Time   WBC 5.5 09/03/2023 0428   RBC 4.24 09/03/2023 0428   HGB 12.8 (L) 09/03/2023 0428   HCT 38.4 (L) 09/03/2023 0428   PLT 163 09/03/2023 0428   MCV 90.6 09/03/2023 0428   MCH 30.2 09/03/2023 0428   MCHC 33.3 09/03/2023 0428   RDW 13.2 09/03/2023 0428   LYMPHSABS 1.1 09/02/2023 1227   MONOABS 0.5 09/02/2023 1227   EOSABS 0.1 09/02/2023 1227   BASOSABS 0.1 09/02/2023 1227    CMP     Component Value Date/Time   NA 136 09/03/2023 0428   K 3.9 09/03/2023 0428   CL 105 09/03/2023 0428   CO2 25 09/03/2023 0428   GLUCOSE 96 09/03/2023 0428   BUN 27 (H) 09/03/2023 0428   CREATININE 1.16 09/03/2023 0428   CALCIUM 8.6 (L) 09/03/2023 0428   PROT 9.0 (H) 09/02/2023 1227   ALBUMIN 4.3 09/02/2023 1227   AST 25 09/02/2023 1227   ALT 17 09/02/2023 1227  ALKPHOS 74 09/02/2023 1227   BILITOT 1.1 09/02/2023 1227   GFRNONAA >60 09/03/2023 0428   GFRAA >60 04/24/2018 1127    Lipid Panel     Component Value Date/Time   CHOL 199 09/03/2023 0428   TRIG 61 09/03/2023 0428   HDL 52 09/03/2023 0428   CHOLHDL 3.8 09/03/2023 0428   VLDL 12 09/03/2023 0428   LDLCALC 135 (H) 09/03/2023 0428    Lab Results  Component Value Date   HGBA1C 5.5 09/02/2023    2D echocardiogram with LVEF 55 to 60%, grade 1 diastolic dysfunction, RV normal, aortic valve not visualized arctic regurgitation, mitral valve normal with no mitral stenosis, no shunt with agitated saline study  Telemetry thus far-NSR  Imaging I have reviewed images in epic and the results pertinent to this consultation are:  His MRI of the brain revealed right thalamic  capsular infarct and 2 punctate infarcts in the right occipital lobe with a possible small subacute infarct in the left frontal white matter with chronic microvascular ischemic changes. His CT angio of the head showed occlusion versus critical stenosis of the right P2 PCA, severe distal left P2 stenosis, moderate stenosis of the nondominant left intradural vertebral artery at its dural margin, aneurysmal dilatation of the aortic arch and descending aorta.  Admitted for further workup  Assessment: 89 year old with right thalamic capsular infarction as well as punctate infarcts in the right occipital lobe with a questionable punctate focus in the left frontal lobe. With severe disease posterior circulation-likely large vessel disease with atheroembolism from the posterior circulation but the frontal area of stroke raises a question of this being a cardioembolic phenomenon. Current etiology technically remains cryptogenic  Impression: Acute risk stroke-strong suspicion for atheroembolic versus cardioembolic-for now remains cryptogenic  Recommendations: Continue telemetry Frequent rechecks Aspirin 81+ Plavix 75 for 3 months followed by aspirin only If A-fib is not detected during hospitalization, would recommend 30-day cardiac monitoring outpatient. High intensity statin-atorvastatin 40 for goal LDL less than 70. A1c is at goal PT OT speech therapy Outpatient neurology follow-up in 8 to 12 weeks. Plan was relayed to Dr. Nelson Chimes Please call neurology with questions as needed.   -- Milon Dikes, MD Neurologist [facility] Pager: [phone]

## 2023-09-04 NOTE — Progress Notes (Signed)
Inpatient Rehab Admissions:  Inpatient Rehab Consult received.  Attempted to contact pt via telephone at hospital. Phone was broken so pt's nurse advised I call pt's daughter. I spoke with patient's daughter James [name] on the telephone for rehabilitation assessment and to discuss goals and expectations of an inpatient rehab admission.  Discussed average length of stay, insurance authorization requirement, discharge home after completion of CIR. James [name] acknowledged understanding. She is interested in pt pursuing CIR. She confirmed that family will provide 24/7 support for pt after discharge. Family also plans to hire additional help. Will continue to follow.  Signed: Wolfgang Phoenix, MS, CCC-SLP Admissions Coordinator [phone]

## 2023-09-04 NOTE — Hospital Course (Signed)
Taken from prior notes.  James [name]. is a 89 y.o. Caucasian male with medical history significant for anxiety and TIA, who presented to the emergency room with acute onset of left-sided weakness.  On Monday 9/16 he stated that he had the TV remote falling from his left hand and when he bent to get it he fell on his left side.  Has been feeling more weakness on the left upper than the left lower extremity since then.   Patient was found to have right thalamic capsular infarct, 2 punctate infarcts in the right occipital lobe and a possible small subacute infarct in the left frontal white matter with chronic microvascular ischemic changes.  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   Given multiple vascular territories are involved, a cardioembolic source should also be evaluated.  9/20: Vital stable.  Echocardiogram with normal EF, grade 1 diastolic dysfunction, no other significant abnormality and no evidence of any intra-arterial shunt. Lipid panel with LDL of 135, goal is less than 70-patient was started on statin. Neurology is recommending aspirin and Plavix for 3 months followed by aspirin only. PT is recommending CIR.  9/21: Awaiting insurance approval for CIR  9/23: Still awaiting insurance authorization for CIR  9/24: Patient remained hemodynamically stable and still feeling weak.  Patient was ready to discharge to CIR for further rehab as recommended by PT.  PT concern of leaning more towards left while working today.  Clinical exam without any acute deficit.  Repeat CT head with increased conspicuity of the known now subacute infarct in the right lateral thalamus, discussed with neurology and this is a normal course of stroke.  Cardiology was also  consulted to place ZIO monitor, if cannot be done before leaving that can be done at CIR. cardiac monitoring while in the hospital for the past many days remains normal sinus rhythm.  Patient will continue on aspirin and Plavix for 3 months as recommended by neurology.  And follow-up with outpatient neurology for further recommendations.  Patient will continue on current medications and need to have a close follow-up with his providers for further management.

## 2023-09-04 NOTE — Progress Notes (Signed)
Speech Language Pathology Treatment: Dysphagia  Patient Details Name: James [name]. MRN: [id number] DOB: 12/28/1933 Today's Date: 09/04/2023 Time: 0820-0900 SLP Time Calculation (min) (ACUTE ONLY): 40 min  Assessment / Plan / Recommendation Clinical Impression  Pt seen for f/u w/ toleration of diet this AM. Pt awake, verbal and sitting EOB feeding self breakfast meal. Pt appeared min Impulsive as he continued to feed himself meal then stated he needed to "get up now to go to the bathroom" -- he attempted to start standing up w/out thought to his balance and need for the walker.  W/ further verbal cues(min-mod), he slowed down and followed all instructions as this SLP escorted him to the bathroom using the walker -- noted balance issues. W/ the meal, he fed himself.   On RA, afebrile. WBC WNL.   Pt appears to present w/ functional oropharyngeal phase swallowing abilities following general aspiration precautions; suspected min pharyngeal phase deficit -- pt stated he "coughed at home sometimes" when drinking his coffee PRIOR TO this infarct/admit. Suspect min sensorimotor deficits whether in setting of new R thalamic infarct vs Baseline, potential Cognitive decline(see Imaging, and per Neurologist statement).  Pt consumed po trials w/ overt cough noted (x1) as he tilted his head back "to finish it"(coffee via cup). No other clinical s/s of aspiration were noted during the meal; No overt s/s of aspiration when pt utilized general swallowing precautions/strategies during intake. Oral phase appeared [facility] w/ timely bolus management, mastication, and control of bolus propulsion for A-P transfer for swallowing. Oral clearing achieved w/ all trial consistencies -- moistened, soft foods given. The slight decreased L labial tone was intermittently present; speech 100% intelligible.  Pt appears at reduced risk for aspiration/aspiration pneumonia when following general aspiration precautions and swallowing  strategies.  Pt does have challenging factors that could impact oropharyngeal swallowing to include new R thalamic infarct, min vision changes from infarcts in the occipital lobe, deconditioning/weakness, and advanced age as well as current LUE decreased sensation potentially impacting self-feeding abilities. These factors can increase risk for aspiration, dysphagia as well as decreased oral intake overall.    Recommend a fairly Regular/mech soft consistency diet w/ well-Cut meats, moistened foods; Thin liquids -- CUP DRINKING ONLY. No Straws. Recommend general aspiration precautions, swallowing strategies(looking midline, down at the plate) as practiced during session. Reduce distractions/talking during meals. Pills WHOLE in Puree for safer, easier swallowing for now and for D/C -- pt consumed Pills in Puree w/ NSG during session w/out difficulty. Education given on Pills in Puree; food consistencies and easy to eat options; general aspiration precautions to pt. ST services will f/u to monitor toleration of diet and use of swallowing precautions/strategies; education. Objective swallow assessment if indicated. NSG/MD updated. Recommend Dietician f/u for support. Pt agreed. Handout left w/ pt in room. Precautions posted on board, chart.     HPI HPI: Pt is a James [name] with medical history significant for anxiety and TIA, who presented to the emergency room on Wednesday with ongoing onset of left-sided weakness.  On Monday, he stated that he had the TV remote falling from his left hand and when he bent to get it he fell on his left side.  Is been feeling more weakness on the left upper than the left lower extremity since then.  He denies any significant paresthesias or other focal muscle weakness.  No headache or dizziness or blurred vision.  No dysphagia or dysarthria.  No chest pain or palpitations.  No nausea  or vomiting or abdominal pain.  Imaging: Noncontrast head CT scan revealed no acute  intraocular abnormalities.  It showed chronic microvascular ischemic disease and temporal lobe predominant cerebral atrophy that can be seen with Alzheimer's disease.  Brain MRI without contrast revealed acute right thalamic capsular infarct and 2 punctate acute infarcts in the right occipital lobe and possible small subacute infarct in the left frontal white matter with chronic microvascular ischemic disease.  CT CTA of the head and neck revealed the following:  1. Occlusion versus critical stenosis of the right P2 PCA with  poor/irregular distal opacification.  2. Severe distal left P2 PCA stenosis.  3. 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  4. 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 A follow-up CTA of the chest is recommended to  fully characterize and to determine follow-up recommendations.      SLP Plan  Continue with current plan of care      Recommendations for follow up therapy are one component of a multi-disciplinary discharge planning process, led by the attending physician.  Recommendations may be updated based on patient status, additional functional criteria and insurance authorization.    Recommendations  Diet recommendations: Dysphagia 3 (mechanical soft);Regular;Thin liquid (cut, moistened foods) Liquids provided via: Cup;No straw Medication Administration: Whole meds with puree Supervision: Patient able to self feed;Intermittent supervision to cue for compensatory strategies Compensations: Minimize environmental distractions;Slow rate;Small sips/bites Postural Changes and/or Swallow Maneuvers: Out of bed for meals;Seated upright 90 degrees;Upright 30-60 min after meal                 (Palliative Care for GOC) Oral care BID;Patient independent with oral care (stup)   Intermittent Supervision/Assistance Dysphagia, pharyngeal phase (R13.13) (baseline Cognitive decline possibly  per Neuro, chart)     Continue with current plan of care      Jerilynn Som, MS, CCC-SLP Speech Language Pathologist Rehab Services; [facility] [place] [phone] (ascom) Kenedi Cilia  09/04/2023, 2:14 PM

## 2023-09-04 NOTE — Progress Notes (Signed)
Physical Therapy Treatment Patient Details Name: James [name]. MRN: [id number] DOB: 07-Dec-1934 Today's Date: 09/04/2023   History of Present Illness 89 y/o male presented to ER secondary to L-sided weakness; admitted for TIA/CVA work up.  Imaging significant for R thalamocapsular infarct, R occipital and L frontal infarcts.    PT Comments  Pt received after working with OT, family present for session. Pt completed gait training with HHA to simulate baseline at home. Pt required MinA to maintain balance due to shuffling like gait with NBOS and general B LE weakness. Increased fatigue upon completion, no dizziness noted. Pt is not at current LOF, requiring services from PT, OT, and ST. Pt is very motivated and would benefit from CIR upon d/c. Pt has a very supportive family to assist if any needs at d/c.   If plan is discharge home, recommend the following: A lot of help with walking and/or transfers;A lot of help with bathing/dressing/bathroom   Can travel by private vehicle        Equipment Recommendations  None recommended by PT    Recommendations for Other Services       Precautions / Restrictions Precautions Precautions: Fall Restrictions Weight Bearing Restrictions: No     Mobility  Bed Mobility Overal bed mobility: Needs Assistance Bed Mobility: Supine to Sit     Supine to sit: Supervision     General bed mobility comments: Pt needed SUP for supine to sit at EOB d/t needing to scoot forward to get BLEs on ground    Transfers Overall transfer level: Needs assistance Equipment used: Rolling walker (2 wheels) Transfers: Sit to/from Stand Sit to Stand: Contact guard assist                Ambulation/Gait Ambulation/Gait assistance: Min assist Gait Distance (Feet):  (60) Assistive device: 1 person hand held assist Gait Pattern/deviations: Step-through pattern, Shuffle, Staggering left, Staggering right, Drifts right/left, Narrow base of support        General Gait Details: Completed gait training with HHA. Pt very unsteady, high fall risk, easily fatigues   Stairs             Wheelchair Mobility     Tilt Bed    Modified Rankin (Stroke Patients Only)       Balance Overall balance assessment: Needs assistance Sitting-balance support: Bilateral upper extremity supported, Feet unsupported Sitting balance-Leahy Scale: Fair     Standing balance support: During functional activity Standing balance-Leahy Scale: Poor                              Cognition Arousal: Alert Behavior During Therapy: WFL for tasks assessed/performed Overall Cognitive Status: Difficult to assess                                 General Comments: Alert and oriented to basic information; follows simple commands.  Marked delay in processing/task initiation at times.  Noted inattention to L hemi-body (esp with dual-task activities)        Exercises General Exercises - Lower Extremity Ankle Circles/Pumps: AROM, Both, 10 reps Long Arc Quad: AROM, Both, 10 reps, Seated Hip Flexion/Marching: AROM, Both, 10 reps, Seated    General Comments General comments (skin integrity, edema, etc.):  (Pt and family educated on role of PT and benefits of CIR vs STR)      Pertinent Vitals/Pain Pain Assessment  Pain Assessment: No/denies pain    Home Living   Living Arrangements: Spouse/significant other Available Help at Discharge: Family;Available 24 hours/day Type of Home: House Home Access: Stairs to enter Entrance Stairs-Rails: None Entrance Stairs-Number of Steps: 1   Home Layout: Able to live on main level with bedroom/bathroom        Prior Function            PT Goals (current goals can now be found in the care plan section) Acute Rehab PT Goals Patient Stated Goal: to return home    Frequency    Min 1X/week      PT Plan      Co-evaluation              AM-PAC PT "6 Clicks" Mobility   Outcome  Measure  Help needed turning from your back to your side while in a flat bed without using bedrails?: A Little Help needed moving from lying on your back to sitting on the side of a flat bed without using bedrails?: A Lot Help needed moving to and from a bed to a chair (including a wheelchair)?: A Lot Help needed standing up from a chair using your arms (e.g., wheelchair or bedside chair)?: A Lot Help needed to walk in hospital room?: A Lot Help needed climbing 3-5 steps with a railing? : A Lot 6 Click Score: 13    End of Session Equipment Utilized During Treatment: Gait belt Activity Tolerance: Patient tolerated treatment well Patient left: in chair;with call bell/phone within reach;with chair alarm set;with family/visitor present Nurse Communication: Mobility status PT Visit Diagnosis: Muscle weakness (generalized) (M62.81);Difficulty in walking, not elsewhere classified (R26.2);Hemiplegia and hemiparesis Hemiplegia - Right/Left: Left Hemiplegia - dominant/non-dominant: Non-dominant Hemiplegia - caused by: Cerebral infarction     Time: 1427-1510 PT Time Calculation (min) (ACUTE ONLY): 43 min  Charges:    $Gait Training: 8-22 mins $Therapeutic Exercise: 8-22 mins $Therapeutic Activity: 8-22 mins PT General Charges $$ ACUTE PT VISIT: 1 Visit                    Zadie Cleverly, PTA  Jannet Askew 09/04/2023, 4:20 PM

## 2023-09-05 DIAGNOSIS — I739 Peripheral vascular disease, unspecified: Secondary | ICD-10-CM | POA: Diagnosis not present

## 2023-09-05 DIAGNOSIS — I6381 Other cerebral infarction due to occlusion or stenosis of small artery: Secondary | ICD-10-CM | POA: Diagnosis not present

## 2023-09-05 DIAGNOSIS — F419 Anxiety disorder, unspecified: Secondary | ICD-10-CM | POA: Diagnosis not present

## 2023-09-05 DIAGNOSIS — N401 Enlarged prostate with lower urinary tract symptoms: Secondary | ICD-10-CM | POA: Diagnosis not present

## 2023-09-05 MED ORDER — POLYETHYLENE GLYCOL 3350 17 G PO PACK
17.0000 g | PACK | Freq: Every day | ORAL | Status: DC
  Administered 2023-09-05 – 2023-09-08 (×4): 17 g via ORAL
  Filled 2023-09-05 (×4): qty 1

## 2023-09-05 NOTE — Progress Notes (Signed)
Occupational Therapy Treatment Patient Details Name: James [name]. MRN: [id number] DOB: 1933-12-30 Today's Date: 09/05/2023   History of present illness 89 y/o male presented to ER secondary to L-sided weakness; admitted for TIA/CVA work up.  Imaging significant for R thalamocapsular infarct, R occipital and L frontal infarcts.   OT comments  Upon entering the room, pt supine in bed and sleeping soundly but alerts to therapist calling his name. Pt initially appears confused when waking up from sleep and needing min A for bed mobility to EOB. Pt stands with Min hand held assistance and ambulates 40' to bathroom for toileting needs. Pt having difficulty managing clothing items in standing and needs assistance from therapist. Pt able to void while standing but needs min A for balance. Pt stands at sink for hand hygiene with full head turn and increased time to locate soap and towel on the L side of sink. Pt having difficulty locating recliner chair on L side of room and continues to need min HHA for ambulation. Breakfast tray set up in front of him with pt attempting B hand coordination tasks to open containers but ultimately needing assistance. Pt seated in recliner chair with chair alarm activated for safety and tray in front of him to eat. Call bell and all needed items within reach.       If plan is discharge home, recommend the following:  Assistance with feeding;Assistance with cooking/housework;Direct supervision/assist for medications management;Direct supervision/assist for financial management;Assist for transportation;Help with stairs or ramp for entrance;Supervision due to cognitive status;A little help with bathing/dressing/bathroom;A little help with walking and/or transfers   Equipment Recommendations  Other (comment) (defer to next venue of care)       Precautions / Restrictions Precautions Precautions: Fall       Mobility Bed Mobility Overal bed mobility: Needs  Assistance Bed Mobility: Supine to Sit     Supine to sit: Min assist          Transfers Overall transfer level: Needs assistance Equipment used: 1 person hand held assist Transfers: Sit to/from Stand Sit to Stand: Contact guard assist                 Balance Overall balance assessment: Needs assistance Sitting-balance support: Bilateral upper extremity supported, Feet unsupported Sitting balance-Leahy Scale: Fair   Postural control: Left lateral lean Standing balance support: During functional activity, Single extremity supported Standing balance-Leahy Scale: Poor                             ADL either performed or assessed with clinical judgement   ADL Overall ADL's : Needs assistance/impaired     Grooming: Wash/dry hands;Standing;Minimal assistance                                        Cognition Arousal: Alert Behavior During Therapy: Impulsive Overall Cognitive Status: Difficult to assess                                 General Comments: Pt needing increased time to process but follows 1 step commands. Pt needing min - mod multimodal cuing secondary to L inattention.                   Pertinent Vitals/ Pain  Pain Assessment Pain Assessment: No/denies pain         Frequency  Min 1X/week        Progress Toward Goals  OT Goals(current goals can now be found in the care plan section)  Progress towards OT goals: Progressing toward goals      AM-PAC OT "6 Clicks" Daily Activity     Outcome Measure   Help from another person eating meals?: A Little Help from another person taking care of personal grooming?: A Little Help from another person toileting, which includes using toliet, bedpan, or urinal?: A Lot Help from another person bathing (including washing, rinsing, drying)?: A Lot Help from another person to put on and taking off regular upper body clothing?: A Little Help from another  person to put on and taking off regular lower body clothing?: A Lot 6 Click Score: 15    End of Session Equipment Utilized During Treatment: Gait belt;Rolling walker (2 wheels)  OT Visit Diagnosis: Hemiplegia and hemiparesis;Other abnormalities of gait and mobility (R26.89);Other symptoms and signs involving the nervous system (R29.898) Hemiplegia - Right/Left: Left Hemiplegia - dominant/non-dominant: Non-Dominant Hemiplegia - caused by: Cerebral infarction   Activity Tolerance Patient tolerated treatment well   Patient Left in chair;with call bell/phone within reach;with chair alarm set   Nurse Communication Mobility status        Time: 1308-6578 OT Time Calculation (min): 13 min  Charges: OT General Charges $OT Visit: 1 Visit OT Treatments $Self Care/Home Management : 8-22 mins  Jackquline Denmark, MS, OTR/L , CBIS ascom [phone]  09/05/23, 11:43 AM

## 2023-09-05 NOTE — Evaluation (Signed)
Speech Language Pathology Evaluation Patient Details Name: James [name]. 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   PAD (peripheral artery disease) (HCC) 11/29/2019   Polyneuropathy 04/27/2019   Anxiety 07/05/2014   GERD (gastroesophageal reflux disease) 07/05/2014   HTN (hypertension) 07/05/2014   Hyperlipidemia 07/05/2014   Late effect of lacunar infarction 07/05/2014   OA (osteoarthritis) 07/05/2014   Benign localized prostatic hyperplasia with lower urinary tract symptoms (LUTS) 12/23/2012   Family history of malignant neoplasm of prostate 12/23/2012   Incomplete emptying of bladder 12/23/2012   Kidney stone 12/23/2012   Past Medical History:  Past Medical History:  Diagnosis Date   Anxiety    TIA (transient ischemic attack)    Past Surgical History:  Past Surgical History:  Procedure Laterality Date   HAND SURGERY Left    HEMORRHOID SURGERY     TONSILLECTOMY AND ADENOIDECTOMY     HPI:  Pt is a 89 y.o. Caucasian male with medical history significant for anxiety and TIA, unsure if any Baseline Cognitive decline(?), who presented to the emergency room on Wednesday with ongoing onset of left-sided weakness.  On Monday, he stated that he had the TV remote falling from his left hand and when he bent to get it he fell on his left side.  Is been feeling more weakness on the left upper than the left lower extremity since then.  He denies any significant paresthesias or other focal muscle weakness.  No headache or dizziness or blurred vision.  No dysphagia or dysarthria.  No chest pain or palpitations.  No nausea or vomiting or abdominal pain.   Imaging: Noncontrast head CT scan revealed no acute intraocular  abnormalities.  It showed chronic microvascular ischemic disease and temporal lobe predominant cerebral atrophy that can be seen with Alzheimer's disease.  Brain MRI without contrast revealed acute right thalamic capsular infarct and 2 punctate acute infarcts in the right occipital lobe and possible small subacute infarct in the left frontal white matter with chronic microvascular ischemic disease.  CT CTA of the head and neck revealed the following:  1. Occlusion versus critical stenosis of the right P2 PCA with  poor/irregular distal opacification.  2. Severe distal left P2 PCA stenosis.  3. 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  4. 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 A follow-up CTA of the chest is recommended to  fully characterize and to determine follow-up recommendations.   Assessment / Plan / Recommendation Clinical Impression   Pt seen today for Cognitive-linguistic screening at bedside using the SLUMS assessment toolinformal; informal cognitive-communication at bedside. Pt was sitting in chair. Noted MRI results indicating "acute right thalamic capsular infarct and 2 punctate acute infarcts in the right occipital lobe and possible small subacute infarct in the left frontal white matter; chronic microvascular ischemic disease and temporal lobe predominant cerebral atrophy that can be seen with Alzheimer's disease.".    Pt awake, verbally conversed w/ this SLP in general, basic conversation appropriately. Min Distracted intermittently but refocused to task w/ verbal cue. Min+ HOH(?).  On RA; afebrile. WBC WNL.    Pt presents with mild-moderate Cognitive-communication impairment characterized by deficits  in the areas of lengthy attention, memory recall, executive functioning, and mental flexibility per demonstration on SLUMS. Score was 18-19/30 which with post HS education is indicative of  neurocognitive deficits. Deficits may impact ability to independently manage finances, medications, and maintain safety in the home environment. No documented h/o Cognitive deficits per chart -- noted Head CT exam results as well as MRI of new R infarct. Supervision in the home and during ADLs would be recommended if returning home independently; Cognitive intervention could be beneficial for further assessment/tx w/ Cognitive ADLs in the D/C environment. Would recommend f/u w/ Neurology for formal assessment of pt's Cognitive function/status.   Primary deficits were associated with memory retrieval tasks and tasks of higher level executive functioning and mental flexibility including basic math and number reversal tasks. Pt improved with cueing during recall/memory. He demonstrated relative strengths in immediate(story) recall of information in context of extra detail/information and orientation w/ relative strength in clock drawing though distracted by the details of the hands of clock. Verbal fluency and object description is functional for basic information but became more Vague when pressed for details. He conversed in general conversation; followed basic 1-2 step instructions and answered questions re: self. No overt expressive/receptive language deficits noted; no overt motor speech deficits noted. He was cooperative and motivated during session.    Pt would benefit from skilled ST while an inpatient and at next venue of care for ongoing Cognitive intervention w/ Cognitive ADLs in his D/C environment to maximize independence. Will follow during acute stay. Encouraged continuation of any Cognitive activities in the home(puzzles, games, physical activity). Pt in agreement w/ plan. Team updated.      SLP Assessment  SLP Recommendation/Assessment: Patient needs continued Speech Lanaguage Pathology Services SLP Visit Diagnosis: Cognitive communication deficit (R41.841)    Recommendations for follow up  therapy are one component of a multi-disciplinary discharge planning process, led by the attending physician.  Recommendations may be updated based on patient status, additional functional criteria and insurance authorization.    Follow Up Recommendations  Follow physician's recommendations for discharge plan and follow up therapies    Assistance Recommended at Discharge  Intermittent Supervision/Assistance  Functional Status Assessment Patient has had a recent decline in their functional status and demonstrates the ability to make significant improvements in function in a reasonable and predictable amount of time.  Frequency and Duration min 2x/week  2 weeks      SLP Evaluation Cognition  Overall Cognitive Status: Impaired/Different from baseline Arousal/Alertness: Awake/alert Orientation Level: Oriented X4 (to his address) Year: 2024 Month: September Day of Week: Correct Attention: Focused;Sustained Focused Attention: Appears intact Sustained Attention: Appears intact Memory: Impaired Memory Impairment: Decreased recall of new information;Decreased short term memory Decreased Short Term Memory: Verbal complex Awareness: Appears intact (grossly) Problem Solving: Impaired Problem Solving Impairment: Verbal complex (anticipatory) Executive Function: Reasoning;Sequencing;Organizing Reasoning: Appears intact Sequencing: Impaired Sequencing Impairment: Verbal complex Organizing: Impaired Organizing Impairment: Verbal complex;Functional complex Behaviors: Impulsive (w/ getting up to go to the bathroom) Safety/Judgment: Impaired Comments: min impulsive w/ decreased awareness of self, safety       Comprehension  Auditory Comprehension Overall Auditory Comprehension: Appears within functional limits for tasks assessed (min vagueness of details) Yes/No Questions: Within Functional Limits Commands: Within Functional Limits (1-2 step) Conversation: Simple Visual  Recognition/Discrimination Discrimination: Not tested Reading Comprehension Reading Status: Within funtional limits (some vision deficits at admit but able to see the menu for items)    Expression Expression Primary Mode of Expression: Verbal Verbal Expression  Overall Verbal Expression: Appears within functional limits for tasks assessed Initiation: No impairment Automatic Speech:  (WFL) Level of Generative/Spontaneous Verbalization: Conversation (min vagueness of details) Naming: No impairment Pragmatics: No impairment Interfering Components:  (n/a) Non-Verbal Means of Communication: Not applicable Written Expression Dominant Hand: Right Written Expression: Not tested   Oral / Motor  Oral Motor/Sensory Function Overall Oral Motor/Sensory Function: Within functional limits (no obvious left labial decreased tone today) Motor Speech Overall Motor Speech: Appears within functional limits for tasks assessed Respiration: Within functional limits Phonation: Normal Resonance: Within functional limits Articulation: Within functional limitis Intelligibility: Intelligible             Jerilynn Som, MS, CCC-SLP Speech Language Pathologist Rehab Services; [facility] [place] [phone] (ascom) Lyvonne Cassell 09/05/2023, 12:40 PM

## 2023-09-05 NOTE — Progress Notes (Signed)
PROGRESS NOTE    James [name].  QIO:[id number] DOB: 12-14-1934 DOA: 09/02/2023 PCP: Marguarite Arbour, MD  [id number]-BB  LOS: 2 days   Brief hospital course:   Assessment & Plan: James [name]. is a 89 y.o. Caucasian male with medical history significant for anxiety and TIA, who presented to the emergency room with acute onset of left-sided weakness.  On Monday 9/16 he stated that he had the TV remote falling from his left hand and when he bent to get it he fell on his left side.  Has been feeling more weakness on the left upper than the left lower extremity since then.   Patient was found to have right thalamic capsular infarct, 2 punctate infarcts in the right occipital lobe and a possible small subacute infarct in the left frontal white matter with chronic microvascular ischemic changes.  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   Given multiple vascular territories are involved, a cardioembolic source should also be evaluated.  9/20: Vital stable.  Echocardiogram with normal EF, grade 1 diastolic dysfunction, no other significant abnormality and no evidence of any intra-arterial shunt. Lipid panel with LDL of 135, goal is less than 70-patient was started on statin. Neurology is recommending aspirin and Plavix for 3 months followed by aspirin only. PT is recommending CIR.  9/21: Awaiting insurance approval for CIR   * Acute thalamic infarction [facility]) --MRI of the brain revealed right thalamic capsular infarct and 2 punctate infarcts in the right occipital lobe with a possible small subacute infarct in the left frontal white matter with chronic microvascular ischemic changes. --CT angio of the head showed occlusion versus critical stenosis of  the right P2 PCA, severe distal left P2 stenosis, moderate stenosis of the nondominant left intradural vertebral artery at its dural margin, aneurysmal dilatation of the aortic arch and descending aorta.  --given multiple vascular territories are involved, a cardioembolic source should also be evaluated.  Plan per neuro: Continue telemetry Continue frequent neurochecks No need for permissive hypertension Aspirin 81+ Plavix 75 for 3 months followed by aspirin only If A-fib is discovered, no A-fib detected so far, will need to stop antiplatelets and be on an anticoagulant only from a neurological standpoint. High intensity statin for LDL goal less than 70.  He is currently at 135.  Recommend atorvastatin 40 given his advanced age. He may need outpatient cardiac monitoring if inpatient telemetry does not reveal atrial fibrillation  PT/OT rec inpatient rehab-approved for CIR, pending insurance approval and bed availability  PAD (peripheral artery disease) (HCC) -cont home plavix --add ASA  Descending aortic aneurysm  --Incidental finding, measures up to 3.5 cm that will need follow-up CT chest CTA.   --consider vascular surgery consult  Benign localized prostatic hyperplasia with lower urinary tract symptoms (LUTS) --cont Proscar and Flomax  Anxiety - continue home Xanax.   DVT prophylaxis: Lovenox SQ Code Status: DNR  Family Communication: son and daughter updated at bedside . Level of care: Telemetry Medical Dispo:   The patient is from: home Anticipated d/c is to: inpatient rehab Anticipated d/c date is: whenever bed available   Subjective and Interval History:  Patient was sitting in chair comfortably when seen today.  No new deficit.  Complaining of constipation.  Objective: Vitals:   09/05/23  0010 09/05/23 0426 09/05/23 0743 09/05/23 1206  BP: 126/74 (!) 150/94 (!) 139/59 (!) 145/79  Pulse: 85 83  86  Resp: 18 20 16 16   Temp: (!) 97.5 F (36.4 C) (!) 97.5 F (36.4 C)  98 F (36.7 C) 97.8 F (36.6 C)  TempSrc: Oral Oral    SpO2: 96% 99% 99% 99%  Weight:      Height:        Intake/Output Summary (Last 24 hours) at 09/05/2023 1545 Last data filed at 09/05/2023 1444 Gross per 24 hour  Intake 360 ml  Output --  Net 360 ml    Filed Weights   09/02/23 1221  Weight: 72.6 kg    Examination:   General.  Well-developed elderly man, in no acute distress. Pulmonary.  Lungs clear bilaterally, normal respiratory effort. CV.  Regular rate and rhythm, no JVD, rub or murmur. Abdomen.  Soft, nontender, nondistended, BS positive. CNS.  Alert and oriented .  No focal neurologic deficit. Extremities.  No edema, no cyanosis, pulses intact and symmetrical. Psychiatry.  Judgment and insight appears normal.     Data Reviewed: I have personally reviewed labs and imaging studies  Time spent: 40 minutes  This record has been created using Conservation officer, historic buildings. Errors have been sought and corrected,but may not always be located. Such creation errors do not reflect on the standard of care.   Arnetha Courser, MD [facility] If 7PM-7AM, please contact night-coverage 09/05/2023, 3:45 PM

## 2023-09-06 DIAGNOSIS — N401 Enlarged prostate with lower urinary tract symptoms: Secondary | ICD-10-CM | POA: Diagnosis not present

## 2023-09-06 DIAGNOSIS — I739 Peripheral vascular disease, unspecified: Secondary | ICD-10-CM | POA: Diagnosis not present

## 2023-09-06 DIAGNOSIS — I6381 Other cerebral infarction due to occlusion or stenosis of small artery: Secondary | ICD-10-CM | POA: Diagnosis not present

## 2023-09-06 DIAGNOSIS — F419 Anxiety disorder, unspecified: Secondary | ICD-10-CM | POA: Diagnosis not present

## 2023-09-06 MED ORDER — BISACODYL 10 MG RE SUPP
10.0000 mg | Freq: Every day | RECTAL | Status: DC | PRN
  Administered 2023-09-06: 10 mg via RECTAL
  Filled 2023-09-06: qty 1

## 2023-09-06 NOTE — Progress Notes (Signed)
Physical Therapy Treatment Patient Details Name: James [name]. MRN: [id number] DOB: 22-May-1934 Today's Date: 09/06/2023   History of Present Illness 89 y/o male presented to ER secondary to L-sided weakness; admitted for TIA/CVA work up.  Imaging significant for R thalamocapsular infarct, R occipital and L frontal infarcts.    PT Comments  Pt in chair.  Dons shoes independently.  SPC obtained as he uses at baseline at home.  He stands with min a x 1 and generally unsteady and hesitant to walk.  After several moments he takes cautions steps with SPC and short choppy steps.  After 30' is he offered a hand and accespts.  Walks 300' with generally unsteady gait with min/mod a x 1 for support.  He describes his gait as "clunky" which is a good Engineer, building services. He is able to remain standing for balance ex at rail in hallway.  Remains up in recliner after session.  Pt remains at increased fall risk and needs +1 assist at all times for any standing mobility.  Would benefit from >3 hours of therapy a day.   If plan is discharge home, recommend the following: A lot of help with walking and/or transfers;A lot of help with bathing/dressing/bathroom;Assist for transportation;Help with stairs or ramp for entrance;Assistance with cooking/housework   Can travel by private vehicle        Equipment Recommendations       Recommendations for Other Services       Precautions / Restrictions Precautions Precautions: Fall Restrictions Weight Bearing Restrictions: No     Mobility  Bed Mobility               General bed mobility comments: in chair before andafter Patient Response: Cooperative  Transfers Overall transfer level: Needs assistance Equipment used: 1 person hand held assist, Straight cane Transfers: Sit to/from Stand Sit to Stand: Min assist                Ambulation/Gait Ambulation/Gait assistance: Min Chemical engineer (Feet): 300 Feet Assistive device: 1 person hand  held assist, Straight cane Gait Pattern/deviations: Step-through pattern, Shuffle, Staggering left, Staggering right, Drifts right/left, Narrow base of support, Decreased step length - right, Decreased step length - left Gait velocity: decreased     General Gait Details: generally unsteady.  initially with just [facility] but as gait progresseda fter 40' and balance observed he is offered and takes +1 HHA for safety   Stairs             Wheelchair Mobility     Tilt Bed Tilt Bed Patient Response: Cooperative  Modified Rankin (Stroke Patients Only)       Balance Overall balance assessment: Needs assistance Sitting-balance support: Bilateral upper extremity supported, Feet unsupported Sitting balance-Leahy Scale: Fair     Standing balance support: During functional activity, Single extremity supported Standing balance-Leahy Scale: Poor                              Cognition Arousal: Alert Behavior During Therapy: Impulsive Overall Cognitive Status: Impaired/Different from baseline                                 General Comments: very talkative        Exercises Other Exercises Other Exercises: standing ex with single hand support on rail and then BUE for squats    General Comments  Pertinent Vitals/Pain Pain Assessment Pain Assessment: No/denies pain    Home Living                          Prior Function            PT Goals (current goals can now be found in the care plan section) Progress towards PT goals: Progressing toward goals    Frequency    Min 1X/week      PT Plan      Co-evaluation              AM-PAC PT "6 Clicks" Mobility   Outcome Measure  Help needed turning from your back to your side while in a flat bed without using bedrails?: A Little Help needed moving from lying on your back to sitting on the side of a flat bed without using bedrails?: A Little Help needed moving to and from  a bed to a chair (including a wheelchair)?: A Lot Help needed standing up from a chair using your arms (e.g., wheelchair or bedside chair)?: A Lot Help needed to walk in hospital room?: A Lot Help needed climbing 3-5 steps with a railing? : A Lot 6 Click Score: 14    End of Session Equipment Utilized During Treatment: Gait belt Activity Tolerance: Patient tolerated treatment well Patient left: in chair;with call bell/phone within reach;with chair alarm set Nurse Communication: Mobility status PT Visit Diagnosis: Muscle weakness (generalized) (M62.81);Difficulty in walking, not elsewhere classified (R26.2);Hemiplegia and hemiparesis Hemiplegia - Right/Left: Left Hemiplegia - dominant/non-dominant: Non-dominant Hemiplegia - caused by: Cerebral infarction     Time: 1200-1215 PT Time Calculation (min) (ACUTE ONLY): 15 min  Charges:    $Gait Training: 8-22 mins PT General Charges $$ ACUTE PT VISIT: 1 Visit                   Danielle Dess, PTA 09/06/23, 12:26 PM

## 2023-09-06 NOTE — Progress Notes (Signed)
Physical Therapy Treatment Patient Details Name: James [name]. MRN: [id number] DOB: [birth year] Today's Date: 09/06/2023   History of Present Illness 89 y/o male presented to ER secondary to L-sided weakness; admitted for TIA/CVA work up.  Imaging significant for R thalamocapsular infarct, R occipital and L frontal infarcts.    PT Comments  Pt in room,  a bit bored and wanting to walk.  Stood with min a x 1 and walks x 2 laps on unit with SPC R and HHA +1 on left for support. Upon return to room he uses bathroom with cues for sequencing and safety.  Overall gait this pm is a bit improved and less "clunky" with gentler and more controlled foot placement but he continues to need extensive assist and is at increased risk for falls.  Hands on +1 at all times is needed for safety.   If plan is discharge home, recommend the following: A lot of help with walking and/or transfers;A lot of help with bathing/dressing/bathroom;Assist for transportation;Help with stairs or ramp for entrance;Assistance with cooking/housework   Can travel by private vehicle        Equipment Recommendations       Recommendations for Other Services       Precautions / Restrictions Precautions Precautions: Fall Restrictions Weight Bearing Restrictions: No     Mobility  Bed Mobility               General bed mobility comments: in chair before andafter Patient Response: Cooperative  Transfers Overall transfer level: Needs assistance Equipment used: 1 person hand held assist, Straight cane Transfers: Sit to/from Stand Sit to Stand: Min assist                Ambulation/Gait Ambulation/Gait assistance: Min Chemical engineer (Feet): 300 Feet Assistive device: 1 person hand held assist, Straight cane Gait Pattern/deviations: Step-through pattern, Shuffle, Staggering left, Staggering right, Drifts right/left, Narrow base of support, Decreased step length - right, Decreased step length -  left Gait velocity: decreased     General Gait Details: generally unsteady.  initially with just [facility] but as gait progresseda fter 40' and balance observed he is offered and takes +1 HHA for safety   Stairs             Wheelchair Mobility     Tilt Bed Tilt Bed Patient Response: Cooperative  Modified Rankin (Stroke Patients Only)       Balance Overall balance assessment: Needs assistance Sitting-balance support: Bilateral upper extremity supported, Feet unsupported Sitting balance-Leahy Scale: Fair     Standing balance support: During functional activity, Single extremity supported Standing balance-Leahy Scale: Poor                              Cognition Arousal: Alert Behavior During Therapy: Impulsive Overall Cognitive Status: Impaired/Different from baseline                                 General Comments: very talkative        Exercises Other Exercises Other Exercises: standing ex with single hand support on rail and then BUE for squats    General Comments        Pertinent Vitals/Pain Pain Assessment Pain Assessment: No/denies pain    Home Living  Prior Function            PT Goals (current goals can now be found in the care plan section) Progress towards PT goals: Progressing toward goals    Frequency    Min 1X/week      PT Plan      Co-evaluation              AM-PAC PT "6 Clicks" Mobility   Outcome Measure  Help needed turning from your back to your side while in a flat bed without using bedrails?: A Little Help needed moving from lying on your back to sitting on the side of a flat bed without using bedrails?: A Little Help needed moving to and from a bed to a chair (including a wheelchair)?: A Lot Help needed standing up from a chair using your arms (e.g., wheelchair or bedside chair)?: A Lot Help needed to walk in hospital room?: A Lot Help needed climbing  3-5 steps with a railing? : A Lot 6 Click Score: 14    End of Session Equipment Utilized During Treatment: Gait belt Activity Tolerance: Patient tolerated treatment well Patient left: in chair;with call bell/phone within reach;with chair alarm set Nurse Communication: Mobility status PT Visit Diagnosis: Muscle weakness (generalized) (M62.81);Difficulty in walking, not elsewhere classified (R26.2);Hemiplegia and hemiparesis Hemiplegia - Right/Left: Left Hemiplegia - dominant/non-dominant: Non-dominant Hemiplegia - caused by: Cerebral infarction     Time: 1520-1530 PT Time Calculation (min) (ACUTE ONLY): 10 min  Charges:    $Gait Training: 8-22 mins PT General Charges $$ ACUTE PT VISIT: 1 Visit                   Danielle Dess, PTA 09/06/23, 3:49 PM

## 2023-09-06 NOTE — Progress Notes (Signed)
PROGRESS NOTE    James [name].  WUJ:[id number] DOB: 1934/12/07 DOA: 09/02/2023 PCP: Marguarite Arbour, MD  128A/128A-AA  LOS: 3 days   Brief hospital course:   Assessment & Plan: James [name]. is a 89 y.o. Caucasian male with medical history significant for anxiety and TIA, who presented to the emergency room with acute onset of left-sided weakness.  On Monday 9/16 he stated that he had the TV remote falling from his left hand and when he bent to get it he fell on his left side.  Has been feeling more weakness on the left upper than the left lower extremity since then.   Patient was found to have right thalamic capsular infarct, 2 punctate infarcts in the right occipital lobe and a possible small subacute infarct in the left frontal white matter with chronic microvascular ischemic changes.  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   Given multiple vascular territories are involved, a cardioembolic source should also be evaluated.  9/20: Vital stable.  Echocardiogram with normal EF, grade 1 diastolic dysfunction, no other significant abnormality and no evidence of any intra-arterial shunt. Lipid panel with LDL of 135, goal is less than 70-patient was started on statin. Neurology is recommending aspirin and Plavix for 3 months followed by aspirin only. PT is recommending CIR.  9/21: Awaiting insurance approval for CIR   * Acute thalamic infarction [facility]) --MRI of the brain revealed right thalamic capsular infarct and 2 punctate infarcts in the right occipital lobe with a possible small subacute infarct in the left frontal white matter with chronic microvascular ischemic changes. --CT angio of the head showed occlusion versus critical stenosis of  the right P2 PCA, severe distal left P2 stenosis, moderate stenosis of the nondominant left intradural vertebral artery at its dural margin, aneurysmal dilatation of the aortic arch and descending aorta.  --given multiple vascular territories are involved, a cardioembolic source should also be evaluated.  Plan per neuro: Continue telemetry Continue frequent neurochecks No need for permissive hypertension Aspirin 81+ Plavix 75 for 3 months followed by aspirin only If A-fib is discovered, no A-fib detected so far, will need to stop antiplatelets and be on an anticoagulant only from a neurological standpoint. High intensity statin for LDL goal less than 70.  He is currently at 135.  Recommend atorvastatin 40 given his advanced age. He may need outpatient cardiac monitoring if inpatient telemetry does not reveal atrial fibrillation  PT/OT rec inpatient rehab-approved for CIR, pending insurance approval and bed availability  PAD (peripheral artery disease) (HCC) -cont home plavix --add ASA  Descending aortic aneurysm  --Incidental finding, measures up to 3.5 cm that will need follow-up CT chest CTA.   --consider vascular surgery consult  Benign localized prostatic hyperplasia with lower urinary tract symptoms (LUTS) --cont Proscar and Flomax  Anxiety - continue home Xanax.   DVT prophylaxis: Lovenox SQ Code Status: DNR  Family Communication:  Level of care: Telemetry Medical Dispo:   The patient is from: home Anticipated d/c is to: inpatient rehab Anticipated d/c date is: whenever bed available   Subjective and Interval History:  Patient was seen and examined today.  No new concern  Objective: Vitals:   09/06/23 0022 09/06/23 0424 09/06/23 0745 09/06/23 1236  BP: 133/79 (!) 111/56 104/71 Marland Kitchen)  125/91  Pulse: 90 65 83 88  Resp: 20 16 16 16   Temp: 98.5 F (36.9 C) 97.8 F (36.6 C) 97.7 F (36.5 C) 97.6 F (36.4 C)  TempSrc:      SpO2: 95% 95% 96% 99%  Weight:      Height:         Intake/Output Summary (Last 24 hours) at 09/06/2023 1516 Last data filed at 09/06/2023 0900 Gross per 24 hour  Intake 120 ml  Output 650 ml  Net -530 ml    Filed Weights   09/02/23 1221  Weight: 72.6 kg    Examination:   General.  Frail elderly man, in no acute distress. Pulmonary.  Lungs clear bilaterally, normal respiratory effort. CV.  Regular rate and rhythm, no JVD, rub or murmur. Abdomen.  Soft, nontender, nondistended, BS positive. CNS.  Alert and oriented .  No focal neurologic deficit. Extremities.  No edema, no cyanosis, pulses intact and symmetrical. Psychiatry.  Judgment and insight appears normal.      Data Reviewed: I have personally reviewed labs and imaging studies  Time spent: 39 minutes  This record has been created using Conservation officer, historic buildings. Errors have been sought and corrected,but may not always be located. Such creation errors do not reflect on the standard of care.   Arnetha Courser, MD [facility] If 7PM-7AM, please contact night-coverage 09/06/2023, 3:16 PM

## 2023-09-06 NOTE — PMR Pre-admission (Signed)
PMR Admission Coordinator Pre-Admission Assessment  Patient: James [name]. is an 89 y.o., male MRN: [id number] DOB: 1934-04-23 Height: 5\' 8"  (172.7 cm) Weight: 72.6 kg              Insurance Information HMO: ***    PPO: ***     PCP:      IPA:      80/20:      OTHER:  PRIMARY: [employer] Medicare      Policy#: [id number]      Subscriber: patient CM Name: ***      Phone#: ***     Fax#: *** Pre-Cert#: ***      Employer: *** Benefits:  Phone #: ***     Name: *** Dolores Hoose. Date: ***     Deduct: ***      Out of Pocket Max: ***      Life Max: ***  CIR: ***      SNF: *** Outpatient: ***     Co-Pay: *** Home Health: ***      Co-Pay: *** DME: ***     Co-Pay: *** Providers: in-network SECONDARY:       Policy#:       Phone#:   Financial Counselor:       Phone#:   The Data processing manager" for patients in Inpatient Rehabilitation Facilities with attached "Privacy Act Statement-Health Care Records" was provided and verbally reviewed with: {CHL IP Patient Family IO:[id number]}  Emergency Contact Information Contact Information     Name Relation Home Work [place] [phone]   Lamarco, Abney Daughter   [phone]   Milas Kocher   [phone]      Other Contacts     Name Relation Home Work Mobile   Snail,Margret Daughter   [phone]      Current Medical History  Patient Admitting Diagnosis: CVA History of Present Illness: Pt is an 89 year old male with medical hx significant for: anxiety, prior TIA, HTN, hyperlipidemia, h/o lacunar infarction with dysarthria and disequilibrium (08/14/23), polyneuropathy, mild cognitive impairment. Pt presented to [facility] on 09/02[facility]23 d/t left arm weakness and numbness in left  hand. Pt not a candidate for TNK. CT head negative for acute abnormalities. MRI revealed right thalamic capsular infarct and two punctate acute infarcts in the right occipital  lobe and possible small subacute infarct in left frontal white matter with chronic microvascular ischemic changes. 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 Therapy evaluations completed and CIR recommended d/t pt's deficits in functional mobiltiy. Complete NIHSS TOTAL: 3    Patient[facility] medical record from [facility] has been reviewed by the rehabilitation admission coordinator and physician.  Past Medical History  Past Medical History:  Diagnosis Date   Anxiety    TIA (transient ischemic attack)     Has the patient had major surgery during 100 days prior to admission? No  Family History  family history includes Prostate cancer in his father.   Current Medications   Current Facility-Administered Medications:    acetaminophen (TYLENOL) tablet 650 mg, 650 mg, Oral, Q6H PRN, 650 mg at 09/04/23 2251 **OR** acetaminophen (TYLENOL) suppository 650 mg, 650 mg, Rectal, Q6H PRN, Mansy, Jan A, MD   ALPRAZolam Prudy Feeler) tablet 1 mg, 1 mg, Oral, BID  PRN, Darlin Priestly, MD, 1 mg at 09/05/23 2054   aspirin EC tablet 81 mg, 81 mg, Oral, Daily, Mansy, Jan A, MD, 81 mg at 09/06/23 0941   atorvastatin (LIPITOR) tablet 40 mg, 40 mg, Oral, Daily, Jaynie Bream, RPH, 40 mg at 09/06/23 0941   clopidogrel (PLAVIX) tablet 75 mg, 75 mg, Oral, Daily, Mansy, Jan A, MD, 75 mg at 09/06/23 0941   enoxaparin (LOVENOX) injection 40 mg, 40 mg, Subcutaneous, Q24H, Mansy, Jan A, MD, 40 mg at 09/05/23 2100   finasteride (PROSCAR) tablet 5 mg, 5 mg, Oral, Daily, Mansy, Jan A, MD, 5 mg at 09/06/23 0941   magnesium hydroxide (MILK OF MAGNESIA) suspension 30 mL, 30 mL, Oral, Daily PRN, Mansy, Jan A, MD, 30 mL at 09/04/23 1737   ondansetron (ZOFRAN) tablet 4 mg, 4 mg, Oral, Q6H PRN **OR**  ondansetron (ZOFRAN) injection 4 mg, 4 mg, Intravenous, Q6H PRN, Mansy, Jan A, MD   polyethylene glycol (MIRALAX / GLYCOLAX) packet 17 g, 17 g, Oral, Daily, Amin, Tilman Neat, MD, 17 g at 09/06/23 0941   tamsulosin (FLOMAX) capsule 0.4 mg, 0.4 mg, Oral, Daily, Mansy, Jan A, MD, 0.4 mg at 09/06/23 0941   traZODone (DESYREL) tablet 100 mg, 100 mg, Oral, QHS, Mansy, Jan A, MD, 100 mg at 09/05/23 2200   traZODone (DESYREL) tablet 25 mg, 25 mg, Oral, QHS PRN, Mansy, Vernetta Honey, MD  Patients Current Diet:  Diet Order             Diet regular Room service appropriate? Yes with Assist; Fluid consistency: Thin  Diet effective now                   Precautions / Restrictions Precautions Precautions: Fall Restrictions Weight Bearing Restrictions: No   Has the patient had 2 or more falls or a fall with injury in the past year?Yes  Prior Activity Level Limited Community (1-2x/wk): gets out of house ~1 day/week  Prior Functional Level Prior Function Prior Level of Function : Independent/Modified Independent Mobility Comments: Mod indep with RW (longer distances) versus SPC (shorter-distances); denies recent fall history.  No driving.  Was actively participating with HHPT prior to admission ADLs Comments: Mod indep with ADLs and light meal prep; does have aide that comes regularly to assist patient/wife.  Self Care: Did the patient need help bathing, dressing, using the toilet or eating?  Independent  Indoor Mobility: Did the patient need assistance with walking from room to room (with or without device)? Independent  Stairs: Did the patient need assistance with internal or external stairs (with or without device)? Needed some help  Functional Cognition: Did the patient need help planning regular tasks such as shopping or remembering to take medications? Needed some help  Patient Information Are you of Hispanic, Latino/a,or Spanish origin?: A. No, not of Hispanic, Latino/a, or Spanish  origin What is your race?: A. White Do you need or want an interpreter to communicate with a doctor or health care staff?: 0. No  Patient's Response To:  Health Literacy and Transportation Is the patient able to respond to health literacy and transportation needs?: Yes Health Literacy - How often do you need to have someone help you when you read instructions, pamphlets, or other written material from your doctor or pharmacy?: Never In the past 12 months, has lack of transportation kept you from medical appointments or from getting medications?: No In the past 12 months, has lack of transportation kept you from meetings, work, or from getting  things needed for daily living?: No  Home Assistive Devices / Equipment Home Assistive Devices/Equipment: Eyeglasses Home Equipment: Agricultural consultant (2 wheels), [employer] - single point, Information systems manager - built in, Personnel officer Use: Indicate devices/aids used by the patient prior to current illness, exacerbation or injury? Walker and cane  Current Functional Level Cognition  Arousal/Alertness: Awake/alert Overall Cognitive Status: Impaired/Different from baseline Difficult to assess due to: Hard of hearing/deaf Orientation Level: Oriented X4 General Comments: very talkative Attention: Focused, Sustained Focused Attention: Appears intact Sustained Attention: Appears intact Memory: Impaired Memory Impairment: Decreased recall of new information, Decreased short term memory Decreased Short Term Memory: Verbal complex Awareness: Appears intact (grossly) Problem Solving: Impaired Problem Solving Impairment: Verbal complex (anticipatory) Executive Function: Reasoning, Sequencing, Organizing Reasoning: Appears intact Sequencing: Impaired Sequencing Impairment: Verbal complex Organizing: Impaired Organizing Impairment: Verbal complex, Functional complex Behaviors: Impulsive (w/ getting up to go to the bathroom) Safety/Judgment:  Impaired Comments: min impulsive w/ decreased awareness of self, safety    Extremity Assessment (includes Sensation/Coordination)  Upper Extremity Assessment: Right hand dominant, LUE deficits/detail LUE Coordination: decreased gross motor, decreased fine motor (inattention to L, did not drop items during tx, but difficulty with FM task of opening toothpaste)  Lower Extremity Assessment: Defer to PT evaluation (motor planning difficulties)    ADLs  Overall ADL's : Needs assistance/impaired Grooming: Wash/dry hands, Standing, Minimal assistance Grooming Details (indicate cue type and reason): Items placed on pt's L side and noted to compensate by turning his entire head to find items Upper Body Bathing: Set up Lower Body Dressing: Minimal assistance, Cueing for sequencing Lower Body Dressing Details (indicate cue type and reason): dons and doffs socks, impaired bilateral coordination Toilet Transfer: Minimal assistance Toilet Transfer Details (indicate cue type and reason): simulated, pt stands with forward lean, requiring cues to correct Functional mobility during ADLs: Cueing for sequencing, Cueing for safety General ADL Comments: Items placed on pt's L side and noted to compensate by turning his entire head to find items; most notable deficits are pt's FMC, L sided inattention and balance/safety deficitis notable with pt requiring Min A d/t L sided lateral lean in standing and downward gaze with forward flexed posture. Pt needed unilateral support on counter for balance as well.    Mobility  Overal bed mobility: Needs Assistance Bed Mobility: Supine to Sit Supine to sit: Min assist General bed mobility comments: in chair before andafter    Transfers  Overall transfer level: Needs assistance Equipment used: 1 person hand held assist, Straight cane Transfers: Sit to/from Stand Sit to Stand: Min assist Bed to/from chair/wheelchair/BSC transfer type:: Stand pivot Stand pivot transfers:  Mod assist, +2 physical assistance General transfer comment: Pt performed STS from EOB to RW with CGA, verb cues to push up from EOB with 1 hand versus pulling on RW. He ambulated ~20 feet using RW with CGA/MIN A for safety and pacing to sink to perform oral hygiene tasks then returned to recliner.    Ambulation / Gait / Stairs / Wheelchair Mobility  Ambulation/Gait Ambulation/Gait assistance: Editor, commissioning (Feet): 300 Feet Assistive device: 1 person hand held assist, Straight cane Gait Pattern/deviations: Step-through pattern, Shuffle, Staggering left, Staggering right, Drifts right/left, Narrow base of support, Decreased step length - right, Decreased step length - left General Gait Details: generally unsteady.  initially with just [facility] but as gait progresseda fter 40' and balance observed he is offered and takes +1 HHA for safety Gait velocity: decreased    Posture /  Balance Dynamic Sitting Balance Sitting balance - Comments: forward trunk flexion, cuing to orient to midline Balance Overall balance assessment: Needs assistance Sitting-balance support: Bilateral upper extremity supported, Feet unsupported Sitting balance-Leahy Scale: Fair Sitting balance - Comments: forward trunk flexion, cuing to orient to midline Postural control: Left lateral lean Standing balance support: During functional activity, Single extremity supported Standing balance-Leahy Scale: Poor Standing balance comment: + 2 for safety    Special needs/care consideration Skin Abrasion: arm, elbow/left     Previous Home Environment (from acute therapy documentation) Living Arrangements: Spouse/significant other  Lives With: Spouse Available Help at Discharge: Family, Available 24 hours/day (per report) Type of Home: House Home Layout: Able to live on main level with bedroom/bathroom Alternate Level Stairs-Number of Steps: no essential needs on upper level of home Home Access: Stairs to enter Entrance  Stairs-Rails: None Entrance Stairs-Number of Steps: 1 Bathroom Shower/Tub: Health visitor: Standard Bathroom Accessibility: Yes How Accessible: Accessible via walker Home Care Services: No  Discharge Living Setting Plans for Discharge Living Setting: Patient's home Type of Home at Discharge: House Discharge Home Layout: Able to live on main level with bedroom/bathroom Discharge Home Access: Stairs to enter Entrance Stairs-Rails: None Entrance Stairs-Number of Steps: 1 Discharge Bathroom Shower/Tub: Walk-in shower Discharge Bathroom Toilet: Standard Discharge Bathroom Accessibility: Yes How Accessible: Accessible via walker Does the patient have any problems obtaining your medications?: No  Social/Family/Support Systems Anticipated Caregiver: Claris Che (daughter) and other children Anticipated Caregiver's Contact Information: Claris Che: [phone] Caregiver Availability: 24/7 Discharge Plan Discussed with Primary Caregiver: Yes Is Caregiver In Agreement with Plan?: Yes Does Caregiver/Family have Issues with Lodging/Transportation while Pt is in Rehab?: No   Goals Patient/Family Goal for Rehab: *** Expected length of stay: *** Pt/Family Agrees to Admission and willing to participate: Yes Program Orientation Provided & Reviewed with Pt/Caregiver Including Roles  & Responsibilities: Yes   Decrease burden of Care through IP rehab admission: NA   Possible need for SNF placement upon discharge:Not anticipated   Patient Condition: {PATIENT'S CONDITION:22832}  Preadmission Screen Completed By:  Domingo Pulse, CCC-SLP, 09/06/2023 4:04 PM ______________________________________________________________________   Discussed status with Dr. Marland Kitchenon***at *** and received approval for admission today.  Admission Coordinator:  Domingo Pulse, time***/Date***

## 2023-09-07 DIAGNOSIS — F419 Anxiety disorder, unspecified: Secondary | ICD-10-CM | POA: Diagnosis not present

## 2023-09-07 DIAGNOSIS — I739 Peripheral vascular disease, unspecified: Secondary | ICD-10-CM | POA: Diagnosis not present

## 2023-09-07 DIAGNOSIS — N401 Enlarged prostate with lower urinary tract symptoms: Secondary | ICD-10-CM | POA: Diagnosis not present

## 2023-09-07 DIAGNOSIS — I6381 Other cerebral infarction due to occlusion or stenosis of small artery: Secondary | ICD-10-CM | POA: Diagnosis not present

## 2023-09-07 NOTE — Progress Notes (Signed)
PROGRESS NOTE    James [name].  ZOX:[id number] DOB: 11-Apr-1934 DOA: 09/02/2023 PCP: Marguarite Arbour, MD  128A/128A-AA  LOS: 4 days   Brief hospital course:   Assessment & Plan: James [name]. is a 89 y.o. Caucasian male with medical history significant for anxiety and TIA, who presented to the emergency room with acute onset of left-sided weakness.  On Monday 9/16 he stated that he had the TV remote falling from his left hand and when he bent to get it he fell on his left side.  Has been feeling more weakness on the left upper than the left lower extremity since then.   Patient was found to have right thalamic capsular infarct, 2 punctate infarcts in the right occipital lobe and a possible small subacute infarct in the left frontal white matter with chronic microvascular ischemic changes.  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   Given multiple vascular territories are involved, a cardioembolic source should also be evaluated.  9/20: Vital stable.  Echocardiogram with normal EF, grade 1 diastolic dysfunction, no other significant abnormality and no evidence of any intra-arterial shunt. Lipid panel with LDL of 135, goal is less than 70-patient was started on statin. Neurology is recommending aspirin and Plavix for 3 months followed by aspirin only. PT is recommending CIR.  9/21: Awaiting insurance approval for CIR  9/23: Insurance authorization still pending for CIR   * Acute thalamic infarction [facility]) --MRI of the brain revealed right thalamic capsular infarct and 2 punctate infarcts in the right occipital lobe with a possible small subacute infarct in the left frontal white matter with chronic microvascular ischemic changes. --CT angio of  the head showed occlusion versus critical stenosis of the right P2 PCA, severe distal left P2 stenosis, moderate stenosis of the nondominant left intradural vertebral artery at its dural margin, aneurysmal dilatation of the aortic arch and descending aorta.  --given multiple vascular territories are involved, a cardioembolic source should also be evaluated.  Plan per neuro: Continue telemetry Continue frequent neurochecks No need for permissive hypertension Aspirin 81+ Plavix 75 for 3 months followed by aspirin only If A-fib is discovered, no A-fib detected so far, will need to stop antiplatelets and be on an anticoagulant only from a neurological standpoint. High intensity statin for LDL goal less than 70.  He is currently at 135.  Recommend atorvastatin 40 given his advanced age. He may need outpatient cardiac monitoring if inpatient telemetry does not reveal atrial fibrillation  PT/OT rec inpatient rehab-approved for CIR, pending insurance approval and bed availability  PAD (peripheral artery disease) (HCC) -cont home plavix --add ASA  Descending aortic aneurysm  --Incidental finding, measures up to 3.5 cm that will need follow-up CT chest CTA.   --consider vascular surgery consult  Benign localized prostatic hyperplasia with lower urinary tract symptoms (LUTS) --cont Proscar and Flomax  Anxiety - continue home Xanax.   DVT prophylaxis: Lovenox SQ Code Status: DNR  Family Communication:  Level of care: Telemetry Medical Dispo:   The patient is from: home Anticipated d/c is to: inpatient rehab Anticipated d/c date is: whenever bed available   Subjective and Interval History:  Patient was sitting comfortably in chair when seen today.  No new concern.  Per patient he was able to walk  with the help around the nursing block.  Objective: Vitals:   09/06/23 1953 09/06/23 2353 09/07/23 0353 09/07/23 0756  BP: (!) 147/97 113/74 133/86 (!) 125/92  Pulse: 91 94 88 79  Resp: 18   18 14   Temp: 97.9 F (36.6 C) (!) 97.4 F (36.3 C) 97.8 F (36.6 C) 98.1 F (36.7 C)  TempSrc: Oral Oral Axillary   SpO2: 100% 100% 98% 99%  Weight:      Height:        Intake/Output Summary (Last 24 hours) at 09/07/2023 1541 Last data filed at 09/07/2023 0800 Gross per 24 hour  Intake 240 ml  Output 250 ml  Net -10 ml    Filed Weights   09/02/23 1221  Weight: 72.6 kg    Examination:   General.  Frail elderly man, in no acute distress. Pulmonary.  Lungs clear bilaterally, normal respiratory effort. CV.  Regular rate and rhythm, no JVD, rub or murmur. Abdomen.  Soft, nontender, nondistended, BS positive. CNS.  Alert and oriented .  No focal neurologic deficit. Extremities.  No edema, no cyanosis, pulses intact and symmetrical. Psychiatry.  Judgment and insight appears normal.      Data Reviewed: I have personally reviewed labs and imaging studies  Time spent: 38 minutes  This record has been created using Conservation officer, historic buildings. Errors have been sought and corrected,but may not always be located. Such creation errors do not reflect on the standard of care.   Arnetha Courser, MD [facility] If 7PM-7AM, please contact night-coverage 09/07/2023, 3:41 PM

## 2023-09-07 NOTE — Progress Notes (Signed)
Physical Therapy Treatment Patient Details Name: James [name]. MRN: [id number] DOB: [birth year] Today's Date: 09/07/2023   History of Present Illness 89 y/o male presented to ER secondary to L-sided weakness; admitted for TIA/CVA work up.  Imaging significant for R thalamocapsular infarct, R occipital and L frontal infarcts.    PT Comments  Pt in bed, ready for session.  Reports poor sleep again last night, generally fatigued but ready to work.  Transitions to sitting EOB with rail and CGA.  Pt asking for his shoes while 1/2 way sitting up and needs cues to sit fully.  Once sitting, he is given shoes on the floor and he is hesitant to reach down to get them needing CGA.  He has shoes tied loosely and slides them on "so they are like Sketchers".  Struggles with grippie socks on and does need assist to get heels in fully.  Agrees to trial RW today which he was resistant to yesterday.  He stands with min a x 1 pulling on walker despite cues.  Once up he is able to walk x 2 around pod unit with RW and min a x 1 with significantly improved gait vs yesterday.  Good step height and length with RW but continued to need +1 for safety, balance and general navigation as he does not remember where his room is.  After short seated rest on bed, Berg balance test in given 17/56 indicating high risk for falls and indicative of RW at all times.  Pt was independent with SPC prior to admission.  SPC is again tried for balance challenges.  He is able to initially walk 20' with [facility] but as he progressed, step length and height decreased to a more shuffling gait with forward lean.  Pt is offered and accepts HHA along with SPC and gait quality continues to decrease.  At times he has exaggerated step height and when asked why he is walking that way he states "I don't know."  Overall pt is progressing towards goals but is far from baseline.  Pt would benefit from up to/ >3 hours a day for therapies upon discharge.   If plan  is discharge home, recommend the following: A lot of help with walking and/or transfers;A lot of help with bathing/dressing/bathroom;Assist for transportation;Help with stairs or ramp for entrance;Assistance with cooking/housework   Can travel by private vehicle        Equipment Recommendations  None recommended by PT    Recommendations for Other Services       Precautions / Restrictions Precautions Precautions: Fall Restrictions Weight Bearing Restrictions: No     Mobility  Bed Mobility Overal bed mobility: Needs Assistance Bed Mobility: Supine to Sit     Supine to sit: Supervision       Patient Response: Cooperative  Transfers Overall transfer level: Needs assistance Equipment used: Rolling walker (2 wheels), None, Straight cane Transfers: Sit to/from Stand Sit to Stand: Min assist                Ambulation/Gait Ambulation/Gait assistance: Min assist, Mod assist Gait Distance (Feet): 300 Feet Assistive device: 1 person hand held assist, Straight cane, Rolling walker (2 wheels) Gait Pattern/deviations: Step-through pattern, Shuffle, Staggering left, Staggering right, Drifts right/left, Narrow base of support, Decreased step length - right, Decreased step length - left, Steppage Gait velocity: decreased     General Gait Details: very varried step pattern during session, gait with RW and with SPC then needing hand held assist  Stairs             Wheelchair Mobility     Tilt Bed Tilt Bed Patient Response: Cooperative  Modified Rankin (Stroke Patients Only)       Balance Overall balance assessment: Needs assistance Sitting-balance support: Bilateral upper extremity supported, Feet unsupported Sitting balance-Leahy Scale: Fair Sitting balance - Comments: unable to reach down and get shoes/don without CGA assist   Standing balance support: Bilateral upper extremity supported, Reliant on assistive device for balance, During functional  activity Standing balance-Leahy Scale: Poor Standing balance comment: generally poor balance with +1 assist at all times and high risk to fall                 Standardized Balance Assessment Standardized Balance Assessment : Berg Balance Test Berg Balance Test Sit to Stand: Able to stand using hands after several tries Standing Unsupported: Able to stand 2 minutes with supervision Sitting with Back Unsupported but Feet Supported on Floor or Stool: Able to sit 2 minutes under supervision Stand to Sit: Uses backs of legs against chair to control descent Transfers: Needs one person to assist Standing Unsupported with Eyes Closed: Able to stand 3 seconds Standing Ubsupported with Feet Together: Able to place feet together independently but unable to hold for 30 seconds From Standing, Reach Forward with Outstretched Arm: Reaches forward but needs supervision From Standing Position, Pick up Object from Floor: Unable to try/needs assist to keep balance From Standing Position, Turn to Look Behind Over each Shoulder: Needs supervision when turning Turn 360 Degrees: Needs assistance while turning Standing Unsupported, Alternately Place Feet on Step/Stool: Needs assistance to keep from falling or unable to try Standing Unsupported, One Foot in Front: Loses balance while stepping or standing Standing on One Leg: Unable to try or needs assist to prevent fall Total Score: 17        Cognition Arousal: Alert Behavior During Therapy: Impulsive Overall Cognitive Status: Impaired/Different from baseline                                          Exercises      General Comments        Pertinent Vitals/Pain Pain Assessment Pain Assessment: Faces Faces Pain Scale: Hurts a little bit Pain Location: general soreness from immobility Pain Descriptors / Indicators: Sore Pain Intervention(s): Limited activity within patient's tolerance, Monitored during session, Repositioned     Home Living                          Prior Function            PT Goals (current goals can now be found in the care plan section) Progress towards PT goals: Progressing toward goals    Frequency    Min 1X/week      PT Plan      Co-evaluation              AM-PAC PT "6 Clicks" Mobility   Outcome Measure  Help needed turning from your back to your side while in a flat bed without using bedrails?: A Little Help needed moving from lying on your back to sitting on the side of a flat bed without using bedrails?: A Little Help needed moving to and from a bed to a chair (including a wheelchair)?: A Little Help needed standing up from a  chair using your arms (e.g., wheelchair or bedside chair)?: A Little Help needed to walk in hospital room?: A Lot Help needed climbing 3-5 steps with a railing? : A Lot 6 Click Score: 16    End of Session Equipment Utilized During Treatment: Gait belt Activity Tolerance: Patient tolerated treatment well Patient left: in chair;with call bell/phone within reach;with chair alarm set Nurse Communication: Mobility status PT Visit Diagnosis: Muscle weakness (generalized) (M62.81);Difficulty in walking, not elsewhere classified (R26.2);Hemiplegia and hemiparesis Hemiplegia - Right/Left: Left Hemiplegia - dominant/non-dominant: Non-dominant Hemiplegia - caused by: Cerebral infarction     Time: 1044-1106 PT Time Calculation (min) (ACUTE ONLY): 22 min  Charges:    $Gait Training: 8-22 mins PT General Charges $$ ACUTE PT VISIT: 1 Visit                    Danielle Dess, PTA 09/07/23, 11:17 AM

## 2023-09-07 NOTE — TOC Progression Note (Signed)
Transition of Care (TOC) - Progression Note    Patient Details  Name: James [name]. MRN: [id number] Date of Birth: [birth year]  Transition of Care [facility]) CM/SW Contact  Allena Katz, LCSW Phone Number: 09/07/2023, 10:46 AM  Clinical Narrative:   [facility] started for CIR. TOC following.    Expected Discharge Plan: IP Rehab Facility Barriers to Discharge: Continued Medical Work up  Expected Discharge Plan and Services                                               Social Determinants of Health (SDOH) Interventions SDOH Screenings   Food Insecurity: No Food Insecurity (09/03/2023)  Housing: Low Risk  (09/03/2023)  Transportation Needs: No Transportation Needs (09/03/2023)  Utilities: Not At Risk (09/03/2023)  Tobacco Use: Medium Risk (09/04/2023)    Readmission Risk Interventions     No data to display

## 2023-09-07 NOTE — Progress Notes (Addendum)
Inpatient Rehab Admissions Coordinator:  Insurance authorization started. Will continue to follow.  1553: Received insurance authorization. Await bed availability.  Wolfgang Phoenix, MS, CCC-SLP Admissions Coordinator [phone]

## 2023-09-07 NOTE — Progress Notes (Addendum)
Speech Language Pathology Treatment: Dysphagia;Cognitive-Linguistic  Patient Details Name: James [name]. MRN: [id number] DOB: 1934-07-21 Today's Date: 09/07/2023 Time: 4259-5638 SLP Time Calculation (min) (ACUTE ONLY): 24 min  Assessment / Plan / Recommendation Clinical Impression  DYSPHAGIA: Pt was seated upright in recliner eating lunch upon arrival of SLP. Pt's daughter was present during this session. Pt was noted to take large bites, and tended to talk with his mouth full of food. Pt educated regarding avoiding this behavior, due to increased risk of aspiration. Slight wet voice quality was noted intermittently, however, pt was  sensate to this and cleared his throat independently.   COGNITIVE-LINGUISTIC: Pt was engaged in conversation regarding recent daily events - walking with PT with walker and cane, as well as remote recall (raised in [place], given options to move to various cities and choosing [place]). Pt asked me where I was from, and required min cues to recall "[facility]" later in the session. Pt verbalized need to go to the bathroom, and required cues to wait to call nursing to get up. Pt appeared slightly impulsive when walking to the bathroom. Good problem solving noted with pt determining how to carry telemetry case while holding the walker (pt was wearing a t-shirt with no pocket and paper scrub bottoms).  Recommend continued ST intervention at next venue - pt/family hoping for transfer to CIR - given level of independence prior to admit.   HPI HPI: Pt is a 89 y.o. Caucasian male with medical history significant for anxiety and TIA, unsure if any Baseline Cognitive decline(?), who presented to the emergency room on Wednesday with ongoing onset of left-sided weakness.  On Monday, he stated that he had the TV remote falling from his left hand and when he bent to get it he fell on his left side.  Is been feeling more weakness on the left upper than the left lower extremity since then.   He denies any significant paresthesias or other focal muscle weakness.  No headache or dizziness or blurred vision.  No dysphagia or dysarthria.  No chest pain or palpitations.  No nausea or vomiting or abdominal pain.  Imaging: Noncontrast head CT scan revealed no acute intraocular abnormalities.  It showed chronic microvascular ischemic disease and temporal lobe predominant cerebral atrophy that can be seen with Alzheimer's disease.  Brain MRI without contrast revealed acute right thalamic capsular infarct and 2 punctate acute infarcts in the right occipital lobe and possible small subacute infarct in the left frontal white matter with chronic microvascular ischemic disease.  CT CTA of the head and neck revealed the following:  1. Occlusion versus critical stenosis of the right P2 PCA with  poor/irregular distal opacification.  2. Severe distal left P2 PCA stenosis.  3. 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  4. 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 A follow-up CTA of the chest is recommended to  fully characterize and to determine follow-up recommendations.      SLP Plan  Continue with current plan of care      Recommendations for follow up therapy are one component of a multi-disciplinary discharge planning process, led by the attending physician.  Recommendations may be updated based on patient status, additional functional criteria and insurance authorization.    Recommendations  Diet recommendations: Dysphagia 3 (mechanical soft);Thin liquid Liquids provided via: Cup;No straw Medication Administration: Whole meds with puree Supervision: Patient able to  self feed;Intermittent supervision to cue for compensatory strategies Compensations: Minimize environmental distractions;Slow rate;Small sips/bites Postural Changes and/or Swallow Maneuvers: Out of bed for meals;Seated upright 90 degrees;Upright  30-60 min after meal                  Oral care BID;Patient independent with oral care   Intermittent Supervision/Assistance Cognitive communication deficit (R41.841)     Continue with current plan of care    James [name] B. Murvin Natal, [facility], CCC-SLP Speech Language Pathologist  James [name] 09/07/2023, 2:49 PM

## 2023-09-07 NOTE — Progress Notes (Signed)
IV team consulted to place a PIV. No medications or IV fluids at this time. Secure chat to MD regarding need for PIV. MD stated okay to leave PIV out.

## 2023-09-07 NOTE — Progress Notes (Signed)
Occupational Therapy Treatment Patient Details Name: James [name]. MRN: [id number] DOB: [birth year] Today's Date: 09/07/2023   History of present illness 89 y/o male presented to ER secondary to L-sided weakness; admitted for TIA/CVA work up.  Imaging significant for R thalamocapsular infarct, R occipital and L frontal infarcts.   OT comments  Pt seen for OT tx this date, received getting up out of bed with alarm going off. Pt with decreased insight into deficits, limited environmental awareness and exhibits motor planning deficits which place him at a risk for falls. Pt requires multimodal cuing for RW mgmt to stand from EOB, functional mobility t/f bathroom with OT providing cues for sequencing/safety with minA to sit on commode. Pt unaware that he has not pulled down pants fully; OT provides additional multimodal cuing for sequencing of task. Pt talkative throughout with impaired short-term memory. Toileting task with minA, cues for postural extension and orientation to midline. Poor dynamic balance during washing hands with poor bimanual coordination UE noted; however, improved from eval. Pt educated on calling for assist for OOB mobility, and return demos use of call bell. Pt requires hands-on assist for mobility at this time to reduce the risk of falls. Pt making good progress toward goals, will continue to follow POC. Discharge recommendation remains appropriate.        If plan is discharge home, recommend the following:  Assistance with feeding;Assistance with cooking/housework;Direct supervision/assist for medications management;Direct supervision/assist for financial management;Assist for transportation;Help with stairs or ramp for entrance;Supervision due to cognitive status;A little help with bathing/dressing/bathroom;A little help with walking and/or transfers   Equipment Recommendations  Other (comment)    Recommendations for Other Services Other (comment)    Precautions /  Restrictions Precautions Precautions: Fall Restrictions Weight Bearing Restrictions: No       Mobility Bed Mobility Overal bed mobility: Needs Assistance Bed Mobility: Supine to Sit, Sit to Supine     Supine to sit: Supervision Sit to supine: Supervision   General bed mobility comments: pt getting up OOB with alarm going off, requires cuing to return to supine centered in bed as pt initally lies down diagnolly    Transfers Overall transfer level: Needs assistance Equipment used: Rolling walker (2 wheels) Transfers: Bed to chair/wheelchair/BSC, Sit to/from Stand Sit to Stand: Min assist     Step pivot transfers: Min assist     General transfer comment: manual cuing for postural extension/orientation to midline, multimodal cuing for technique of RW     Balance Overall balance assessment: Needs assistance Sitting-balance support: Bilateral upper extremity supported, Feet unsupported Sitting balance-Leahy Scale: Fair Sitting balance - Comments: forward trunk flexion, cuing to orient to midline   Standing balance support: Bilateral upper extremity supported, Reliant on assistive device for balance, During functional activity Standing balance-Leahy Scale: Poor Standing balance comment: poor management and utilization of RW in environment                           ADL either performed or assessed with clinical judgement   ADL Overall ADL's : Needs assistance/impaired     Grooming: Wash/dry hands;Standing;Minimal assistance               Lower Body Dressing: Minimal assistance;Cueing for sequencing Lower Body Dressing Details (indicate cue type and reason): fixes socks seated on commode Toilet Transfer: Minimal assistance Toilet Transfer Details (indicate cue type and reason): using RW, pt forward lean requiring cues to correct Toileting- Clothing Manipulation and  Hygiene: Contact guard assist;Cueing for sequencing;Cueing for safety;Sit to/from  stand Toileting - Clothing Manipulation Details (indicate cue type and reason): needs cues to pull pants completely off hips     Functional mobility during ADLs: Cueing for sequencing;Cueing for safety;Rolling walker (2 wheels) General ADL Comments: Pt talkative, cues to redirect, impaired STM, poor awareness of deficits and environmental safety     Perception Perception Perception: Impaired Preception Impairment Details: Inattention/Neglect   Praxis Praxis Praxis: Impaired Praxis Impairment Details: Motor planning    Cognition Arousal: Alert Behavior During Therapy: Impulsive Overall Cognitive Status: Impaired/Different from baseline Area of Impairment: Safety/judgement, Problem solving, Following commands, Awareness, Memory                     Memory: Decreased short-term memory Following Commands: Follows one step commands inconsistently, Follows one step commands with increased time Safety/Judgement: Decreased awareness of safety, Decreased awareness of deficits   Problem Solving: Slow processing, Difficulty sequencing General Comments: very talkative                   Pertinent Vitals/ Pain       Pain Assessment Pain Assessment: No/denies pain   Frequency  Min 1X/week        Progress Toward Goals  OT Goals(current goals can now be found in the care plan section)  Progress towards OT goals: Progressing toward goals  Acute Rehab OT Goals OT Goal Formulation: With patient Time For Goal Achievement: 09/18/23 Potential to Achieve Goals: Good  Plan         AM-PAC OT "6 Clicks" Daily Activity     Outcome Measure   Help from another person eating meals?: A Little Help from another person taking care of personal grooming?: A Little Help from another person toileting, which includes using toliet, bedpan, or urinal?: A Lot Help from another person bathing (including washing, rinsing, drying)?: A Lot Help from another person to put on and taking  off regular upper body clothing?: A Little Help from another person to put on and taking off regular lower body clothing?: A Lot 6 Click Score: 15    End of Session Equipment Utilized During Treatment: Gait belt;Rolling walker (2 wheels)  OT Visit Diagnosis: Hemiplegia and hemiparesis;Other abnormalities of gait and mobility (R26.89);Other symptoms and signs involving the nervous system (R29.898) Hemiplegia - Right/Left: Left Hemiplegia - dominant/non-dominant: Non-Dominant Hemiplegia - caused by: Cerebral infarction   Activity Tolerance Patient tolerated treatment well   Patient Left in bed;with call bell/phone within reach;with bed alarm set   Nurse Communication Mobility status        Time: 0865-7846 OT Time Calculation (min): 24 min  Charges: OT General Charges $OT Visit: 1 Visit OT Treatments $Self Care/Home Management : 23-37 mins  Celsey Asselin L. Kyrra Prada, OTR/L  09/07/23, 10:34 AM

## 2023-09-07 NOTE — Plan of Care (Signed)
Problem: Ischemic Stroke/TIA Tissue Perfusion: Goal: Complications of ischemic stroke/TIA will be minimized Outcome: Progressing

## 2023-09-07 NOTE — Care Management Important Message (Signed)
Important Message  Patient Details  Name: James [name]. MRN: [id number] Date of Birth: January 01, 1934   Medicare Important Message Given:  Yes     Olegario Messier A [name] 09/07/2023, 12:04 PM

## 2023-09-07 NOTE — Plan of Care (Signed)
Problem: Education: Goal: Knowledge of disease or condition will improve 09/07/2023 0544 by Smiley Houseman, LPN Outcome: Progressing 09/07/2023 0544 by Smiley Houseman, LPN Outcome: Progressing Goal: Knowledge of secondary prevention will improve (MUST DOCUMENT ALL) 09/07/2023 0544 by Smiley Houseman, LPN Outcome: Progressing 09/07/2023 0544 by Smiley Houseman, LPN Outcome: Progressing Goal: Knowledge of patient specific risk factors will improve Loraine Leriche N/A or DELETE if not current risk factor) 09/07/2023 0544 by Smiley Houseman, LPN Outcome: Progressing 09/07/2023 0544 by Smiley Houseman, LPN Outcome: Progressing   Problem: Ischemic Stroke/TIA Tissue Perfusion: Goal: Complications of ischemic stroke/TIA will be minimized 09/07/2023 0544 by Smiley Houseman, LPN Outcome: Progressing 09/07/2023 0544 by Smiley Houseman, LPN Outcome: Progressing   Problem: Coping: Goal: Will verbalize positive feelings about self 09/07/2023 0544 by Smiley Houseman, LPN Outcome: Progressing 09/07/2023 0544 by Smiley Houseman, LPN Outcome: Progressing Goal: Will identify appropriate support needs 09/07/2023 0544 by Smiley Houseman, LPN Outcome: Progressing 09/07/2023 0544 by Smiley Houseman, LPN Outcome: Progressing   Problem: Health Behavior/Discharge Planning: Goal: Ability to manage health-related needs will improve 09/07/2023 0544 by Smiley Houseman, LPN Outcome: Progressing 09/07/2023 0544 by Smiley Houseman, LPN Outcome: Progressing Goal: Goals will be collaboratively established with patient/family 09/07/2023 0544 by Smiley Houseman, LPN Outcome: Progressing 09/07/2023 0544 by Smiley Houseman, LPN Outcome: Progressing   Problem: Self-Care: Goal: Ability to participate in self-care as condition permits will improve 09/07/2023 0544 by Smiley Houseman, LPN Outcome: Progressing 09/07/2023 0544 by Smiley Houseman, LPN Outcome: Progressing Goal: Verbalization of feelings and concerns over difficulty with self-care will improve 09/07/2023 0544 by Smiley Houseman, LPN Outcome: Progressing 09/07/2023 0544 by Smiley Houseman, LPN Outcome: Progressing Goal: Ability to communicate needs accurately will improve 09/07/2023 0544 by Smiley Houseman, LPN Outcome: Progressing 09/07/2023 0544 by Smiley Houseman, LPN Outcome: Progressing   Problem: Nutrition: Goal: Risk of aspiration will decrease 09/07/2023 0544 by Smiley Houseman, LPN Outcome: Progressing 09/07/2023 0544 by Smiley Houseman, LPN Outcome: Progressing Goal: Dietary intake will improve 09/07/2023 0544 by Smiley Houseman, LPN Outcome: Progressing 09/07/2023 0544 by Smiley Houseman, LPN Outcome: Progressing   Problem: Education: Goal: Knowledge of General Education information will improve Description: Including pain rating scale, medication(s)/side effects and non-pharmacologic comfort measures 09/07/2023 0544 by Smiley Houseman, LPN Outcome: Progressing 09/07/2023 0544 by Smiley Houseman, LPN Outcome: Progressing   Problem: Health Behavior/Discharge Planning: Goal: Ability to manage health-related needs will improve 09/07/2023 0544 by Smiley Houseman, LPN Outcome: Progressing 09/07/2023 0544 by Smiley Houseman, LPN Outcome: Progressing   Problem: Clinical Measurements: Goal: Ability to maintain clinical measurements within normal limits will improve 09/07/2023 0544 by Smiley Houseman, LPN Outcome: Progressing 09/07/2023 0544 by Smiley Houseman, LPN Outcome: Progressing Goal: Will remain free from infection 09/07/2023 0544 by Smiley Houseman, LPN Outcome: Progressing 09/07/2023 0544 by Smiley Houseman, LPN Outcome: Progressing Goal: Diagnostic test results will improve 09/07/2023 0544 by Smiley Houseman, LPN Outcome: Progressing 09/07/2023 0544 by Smiley Houseman, LPN Outcome: Progressing Goal: Respiratory complications will improve 09/07/2023 0544 by Smiley Houseman, LPN Outcome: Progressing 09/07/2023 0544 by Smiley Houseman, LPN Outcome: Progressing Goal: Cardiovascular complication will be avoided 09/07/2023 0544 by Smiley Houseman,  LPN Outcome: Progressing 09/07/2023 0544 by Smiley Houseman, LPN Outcome: Progressing   Problem: Activity: Goal: Risk for activity intolerance will decrease 09/07/2023 0544 by Smiley Houseman, LPN Outcome: Progressing  09/07/2023 0544 by Smiley Houseman, LPN Outcome: Progressing   Problem: Nutrition: Goal: Adequate nutrition will be maintained 09/07/2023 0544 by Smiley Houseman, LPN Outcome: Progressing 09/07/2023 0544 by Smiley Houseman, LPN Outcome: Progressing   Problem: Coping: Goal: Level of anxiety will decrease 09/07/2023 0544 by Smiley Houseman, LPN Outcome: Progressing 09/07/2023 0544 by Smiley Houseman, LPN Outcome: Progressing   Problem: Elimination: Goal: Will not experience complications related to bowel motility 09/07/2023 0544 by Smiley Houseman, LPN Outcome: Progressing 09/07/2023 0544 by Smiley Houseman, LPN Outcome: Progressing Goal: Will not experience complications related to urinary retention 09/07/2023 0544 by Smiley Houseman, LPN Outcome: Progressing 09/07/2023 0544 by Smiley Houseman, LPN Outcome: Progressing   Problem: Pain Managment: Goal: General experience of comfort will improve 09/07/2023 0544 by Smiley Houseman, LPN Outcome: Progressing 09/07/2023 0544 by Smiley Houseman, LPN Outcome: Progressing   Problem: Safety: Goal: Ability to remain free from injury will improve 09/07/2023 0544 by Smiley Houseman, LPN Outcome: Progressing 09/07/2023 0544 by Smiley Houseman, LPN Outcome: Progressing   Problem: Skin Integrity: Goal: Risk for impaired skin integrity will decrease 09/07/2023 0544 by Smiley Houseman, LPN Outcome: Progressing 09/07/2023 0544 by Smiley Houseman, LPN Outcome: Progressing

## 2023-09-08 ENCOUNTER — Encounter (HOSPITAL_COMMUNITY): Payer: Self-pay | Admitting: Physical Medicine & Rehabilitation

## 2023-09-08 ENCOUNTER — Inpatient Hospital Stay (HOSPITAL_COMMUNITY)
Admit: 2023-09-08 | Discharge: 2023-09-08 | Disposition: A | Discharge status: Home / Self Care | Payer: Medicare Other | Attending: Cardiology | Admitting: Cardiology

## 2023-09-08 ENCOUNTER — Inpatient Hospital Stay (HOSPITAL_COMMUNITY)
Admission: AD | Admit: 2023-09-08 | Discharge: 2023-09-20 | DRG: 057 | Disposition: A | Discharge status: Home Health | Payer: Medicare Other | Source: Other Acute Inpatient Hospital | Attending: Physical Medicine and Rehabilitation | Admitting: Physical Medicine and Rehabilitation

## 2023-09-08 ENCOUNTER — Other Ambulatory Visit: Payer: Self-pay

## 2023-09-08 ENCOUNTER — Inpatient Hospital Stay: Payer: Medicare Other

## 2023-09-08 DIAGNOSIS — K5900 Constipation, unspecified: Secondary | ICD-10-CM | POA: Diagnosis present

## 2023-09-08 DIAGNOSIS — Z7902 Long term (current) use of antithrombotics/antiplatelets: Secondary | ICD-10-CM

## 2023-09-08 DIAGNOSIS — I69318 Other symptoms and signs involving cognitive functions following cerebral infarction: Secondary | ICD-10-CM | POA: Diagnosis not present

## 2023-09-08 DIAGNOSIS — K5901 Slow transit constipation: Secondary | ICD-10-CM | POA: Diagnosis not present

## 2023-09-08 DIAGNOSIS — Z87891 Personal history of nicotine dependence: Secondary | ICD-10-CM

## 2023-09-08 DIAGNOSIS — R32 Unspecified urinary incontinence: Secondary | ICD-10-CM | POA: Diagnosis present

## 2023-09-08 DIAGNOSIS — W19XXXA Unspecified fall, initial encounter: Secondary | ICD-10-CM

## 2023-09-08 DIAGNOSIS — Z1152 Encounter for screening for COVID-19: Secondary | ICD-10-CM | POA: Diagnosis not present

## 2023-09-08 DIAGNOSIS — R5381 Other malaise: Secondary | ICD-10-CM | POA: Diagnosis not present

## 2023-09-08 DIAGNOSIS — M2550 Pain in unspecified joint: Secondary | ICD-10-CM | POA: Diagnosis present

## 2023-09-08 DIAGNOSIS — I69354 Hemiplegia and hemiparesis following cerebral infarction affecting left non-dominant side: Principal | ICD-10-CM

## 2023-09-08 DIAGNOSIS — I69311 Memory deficit following cerebral infarction: Secondary | ICD-10-CM

## 2023-09-08 DIAGNOSIS — N401 Enlarged prostate with lower urinary tract symptoms: Secondary | ICD-10-CM | POA: Diagnosis not present

## 2023-09-08 DIAGNOSIS — M792 Neuralgia and neuritis, unspecified: Secondary | ICD-10-CM | POA: Diagnosis not present

## 2023-09-08 DIAGNOSIS — E785 Hyperlipidemia, unspecified: Secondary | ICD-10-CM | POA: Diagnosis present

## 2023-09-08 DIAGNOSIS — I7122 Aneurysm of the aortic arch, without rupture: Secondary | ICD-10-CM | POA: Diagnosis present

## 2023-09-08 DIAGNOSIS — F32A Depression, unspecified: Secondary | ICD-10-CM | POA: Diagnosis present

## 2023-09-08 DIAGNOSIS — I129 Hypertensive chronic kidney disease with stage 1 through stage 4 chronic kidney disease, or unspecified chronic kidney disease: Secondary | ICD-10-CM | POA: Diagnosis present

## 2023-09-08 DIAGNOSIS — Z8042 Family history of malignant neoplasm of prostate: Secondary | ICD-10-CM

## 2023-09-08 DIAGNOSIS — Z79899 Other long term (current) drug therapy: Secondary | ICD-10-CM | POA: Diagnosis not present

## 2023-09-08 DIAGNOSIS — I639 Cerebral infarction, unspecified: Secondary | ICD-10-CM

## 2023-09-08 DIAGNOSIS — I6381 Other cerebral infarction due to occlusion or stenosis of small artery: Principal | ICD-10-CM | POA: Diagnosis present

## 2023-09-08 DIAGNOSIS — H9190 Unspecified hearing loss, unspecified ear: Secondary | ICD-10-CM | POA: Diagnosis present

## 2023-09-08 DIAGNOSIS — I69322 Dysarthria following cerebral infarction: Secondary | ICD-10-CM | POA: Diagnosis not present

## 2023-09-08 DIAGNOSIS — I739 Peripheral vascular disease, unspecified: Secondary | ICD-10-CM | POA: Diagnosis not present

## 2023-09-08 DIAGNOSIS — F419 Anxiety disorder, unspecified: Secondary | ICD-10-CM | POA: Diagnosis present

## 2023-09-08 DIAGNOSIS — N189 Chronic kidney disease, unspecified: Secondary | ICD-10-CM | POA: Diagnosis not present

## 2023-09-08 DIAGNOSIS — N182 Chronic kidney disease, stage 2 (mild): Secondary | ICD-10-CM | POA: Diagnosis present

## 2023-09-08 DIAGNOSIS — F4321 Adjustment disorder with depressed mood: Secondary | ICD-10-CM | POA: Diagnosis present

## 2023-09-08 DIAGNOSIS — R3915 Urgency of urination: Secondary | ICD-10-CM | POA: Diagnosis present

## 2023-09-08 DIAGNOSIS — I716 Thoracoabdominal aortic aneurysm, without rupture, unspecified: Secondary | ICD-10-CM | POA: Diagnosis present

## 2023-09-08 DIAGNOSIS — R1310 Dysphagia, unspecified: Secondary | ICD-10-CM | POA: Diagnosis not present

## 2023-09-08 DIAGNOSIS — W19XXXD Unspecified fall, subsequent encounter: Secondary | ICD-10-CM | POA: Diagnosis present

## 2023-09-08 DIAGNOSIS — N400 Enlarged prostate without lower urinary tract symptoms: Secondary | ICD-10-CM | POA: Diagnosis present

## 2023-09-08 DIAGNOSIS — G629 Polyneuropathy, unspecified: Secondary | ICD-10-CM | POA: Diagnosis present

## 2023-09-08 DIAGNOSIS — I69391 Dysphagia following cerebral infarction: Secondary | ICD-10-CM | POA: Diagnosis not present

## 2023-09-08 DIAGNOSIS — Z7982 Long term (current) use of aspirin: Secondary | ICD-10-CM

## 2023-09-08 DIAGNOSIS — R5383 Other fatigue: Secondary | ICD-10-CM | POA: Diagnosis not present

## 2023-09-08 DIAGNOSIS — R350 Frequency of micturition: Secondary | ICD-10-CM | POA: Diagnosis present

## 2023-09-08 DIAGNOSIS — G4700 Insomnia, unspecified: Secondary | ICD-10-CM | POA: Diagnosis not present

## 2023-09-08 DIAGNOSIS — R1312 Dysphagia, oropharyngeal phase: Secondary | ICD-10-CM | POA: Diagnosis present

## 2023-09-08 DIAGNOSIS — T801XXD Vascular complications following infusion, transfusion and therapeutic injection, subsequent encounter: Secondary | ICD-10-CM | POA: Diagnosis not present

## 2023-09-08 DIAGNOSIS — F411 Generalized anxiety disorder: Secondary | ICD-10-CM | POA: Diagnosis not present

## 2023-09-08 MED ORDER — ASPIRIN 81 MG PO TBEC: 81.0000 mg | DELAYED_RELEASE_TABLET | Freq: Every day | ORAL | Status: DC

## 2023-09-08 MED ORDER — FINASTERIDE 5 MG PO TABS
5.0000 mg | ORAL_TABLET | Freq: Every day | ORAL | Status: DC
  Administered 2023-09-09 – 2023-09-20 (×12): 5 mg via ORAL
  Filled 2023-09-08 (×12): qty 1

## 2023-09-08 MED ORDER — ATORVASTATIN CALCIUM 40 MG PO TABS: 40.0000 mg | ORAL_TABLET | Freq: Every day | ORAL | Status: DC

## 2023-09-08 MED ORDER — ACETAMINOPHEN 650 MG RE SUPP: 650.0000 mg | Freq: Four times a day (QID) | RECTAL | Status: DC | PRN

## 2023-09-08 MED ORDER — CLOPIDOGREL BISULFATE 75 MG PO TABS
75.0000 mg | ORAL_TABLET | Freq: Every day | ORAL | Status: DC
  Administered 2023-09-09 – 2023-09-20 (×12): 75 mg via ORAL
  Filled 2023-09-08 (×12): qty 1

## 2023-09-08 MED ORDER — POLYETHYLENE GLYCOL 3350 17 G PO PACK: 17.0000 g | PACK | Freq: Every day | ORAL | Status: AC

## 2023-09-08 MED ORDER — TAMSULOSIN HCL 0.4 MG PO CAPS
0.4000 mg | ORAL_CAPSULE | Freq: Every day | ORAL | Status: DC
  Administered 2023-09-09 – 2023-09-20 (×12): 0.4 mg via ORAL
  Filled 2023-09-08 (×12): qty 1

## 2023-09-08 MED ORDER — ALPRAZOLAM 0.5 MG PO TABS
1.0000 mg | ORAL_TABLET | Freq: Two times a day (BID) | ORAL | Status: DC | PRN
  Administered 2023-09-10 – 2023-09-12 (×3): 1 mg via ORAL
  Filled 2023-09-08 (×3): qty 2

## 2023-09-08 MED ORDER — ONDANSETRON HCL 4 MG/2ML IJ SOLN: 4.0000 mg | Freq: Four times a day (QID) | INTRAMUSCULAR | Status: DC | PRN

## 2023-09-08 MED ORDER — ACETAMINOPHEN 325 MG PO TABS: 650.0000 mg | ORAL_TABLET | Freq: Four times a day (QID) | ORAL | Status: DC | PRN

## 2023-09-08 MED ORDER — ENOXAPARIN SODIUM 40 MG/0.4ML IJ SOSY
40.0000 mg | PREFILLED_SYRINGE | INTRAMUSCULAR | Status: DC
  Administered 2023-09-08 – 2023-09-19 (×12): 40 mg via SUBCUTANEOUS
  Filled 2023-09-08 (×12): qty 0.4

## 2023-09-08 MED ORDER — BISACODYL 10 MG RE SUPP: 10.0000 mg | Freq: Every day | RECTAL | Status: DC | PRN

## 2023-09-08 MED ORDER — TRAZODONE HCL 50 MG PO TABS
100.0000 mg | ORAL_TABLET | Freq: Every day | ORAL | Status: DC
  Administered 2023-09-08 – 2023-09-19 (×12): 100 mg via ORAL
  Filled 2023-09-08 (×12): qty 2

## 2023-09-08 MED ORDER — ASPIRIN 81 MG PO TBEC
81.0000 mg | DELAYED_RELEASE_TABLET | Freq: Every day | ORAL | Status: DC
  Administered 2023-09-09 – 2023-09-20 (×12): 81 mg via ORAL
  Filled 2023-09-08 (×12): qty 1

## 2023-09-08 MED ORDER — ATORVASTATIN CALCIUM 40 MG PO TABS
40.0000 mg | ORAL_TABLET | Freq: Every day | ORAL | Status: DC
  Administered 2023-09-09 – 2023-09-20 (×12): 40 mg via ORAL
  Filled 2023-09-08 (×12): qty 1

## 2023-09-08 MED ORDER — ONDANSETRON HCL 4 MG PO TABS: 4.0000 mg | ORAL_TABLET | Freq: Four times a day (QID) | ORAL | Status: DC | PRN

## 2023-09-08 MED ORDER — ENOXAPARIN SODIUM 40 MG/0.4ML IJ SOSY: 40.0000 mg | PREFILLED_SYRINGE | INTRAMUSCULAR | Status: DC

## 2023-09-08 MED ORDER — POLYETHYLENE GLYCOL 3350 17 G PO PACK
17.0000 g | PACK | Freq: Every day | ORAL | Status: DC
  Administered 2023-09-09 – 2023-09-11 (×3): 17 g via ORAL
  Filled 2023-09-08 (×3): qty 1

## 2023-09-08 NOTE — Discharge Summary (Signed)
Physician Discharge Summary   Patient: James [name]. MRN: [id number] DOB: 04-28-1934  Admit date:     09/02/2023  Discharge date: 09/08/23  Discharge Physician: Arnetha Courser   PCP: Marguarite Arbour, MD   Recommendations at discharge:  Please obtain CBC and BMP on follow-up Patient is being discharged on aspirin and Plavix for 44-month and then aspirin only. Follow-up with neurology in 3 to 4 weeks for further recommendation Patient is also being discharged with Zio monitor to rule out any underlying arrhythmia.  For which he will follow-up with cardiology.  Discharge Diagnoses: Principal Problem:   Acute thalamic infarction [facility]) Active Problems:   PAD (peripheral artery disease) (HCC)   Anxiety   Benign localized prostatic hyperplasia with lower urinary tract symptoms (LUTS)   Descending aortic arch aneurysm (HCC)   Cerebrovascular accident (CVA) [facility])   [facility] Course: Taken from prior notes.  James [name]. is a 89 y.o. Caucasian male with medical history significant for anxiety and TIA, who presented to the emergency room with acute onset of left-sided weakness.  On Monday 9/16 he stated that he had the TV remote falling from his left hand and when he bent to get it he fell on his left side.  Has been feeling more weakness on the left upper than the left lower extremity since then.   Patient was found to have right thalamic capsular infarct, 2 punctate infarcts in the right occipital lobe and a possible small subacute infarct in the left frontal white matter with chronic microvascular ischemic changes.  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   Given multiple vascular territories are involved, a cardioembolic source should also  be evaluated.  9/20: Vital stable.  Echocardiogram with normal EF, grade 1 diastolic dysfunction, no other significant abnormality and no evidence of any intra-arterial shunt. Lipid panel with LDL of 135, goal is less than 70-patient was started on statin. Neurology is recommending aspirin and Plavix for 3 months followed by aspirin only. PT is recommending CIR.  9/21: Awaiting insurance approval for CIR  9/23: Still awaiting insurance authorization for CIR  9/24: Patient remained hemodynamically stable and still feeling weak.  Patient was ready to discharge to CIR for further rehab as recommended by PT.  PT concern of leaning more towards left while working today.  Clinical exam without any acute deficit.  Repeat CT head with increased conspicuity of the known now subacute infarct in the right lateral thalamus, discussed with neurology and this is a normal course of stroke.  Cardiology was also consulted to place ZIO monitor, if cannot be done before leaving that can be done at CIR. cardiac monitoring while in the hospital for the past many days remains normal sinus rhythm.  Patient will continue on aspirin and Plavix for 3 months as recommended by neurology.  And follow-up with outpatient neurology for further recommendations.  Patient will continue on current medications and need to have a close follow-up with his providers for further management.      Assessment and Plan: * Acute thalamic infarction [facility]) - The patient will be admitted to an observation medically monitored bed. - This is manifested by left-sided hemiparesis. - We will follow neuro checks q.4 hours for 24 hours.   - The  patient will be placed on aspirin in addition to his Plavix.   - We obtained head and neck CTA and we will obtain a 2D echo with bubble study .   - A neurology consultation  as well as physical/occupation/speech therapy consults will be obtained in a.m.Marland Kitchen - I notified Dr. Wilford Corner about the patient. -  The patient will be placed on statin therapy and fasting lipids will be checked.   PAD (peripheral artery disease) (HCC) - We will add aspirin to his Plavix as mentioned above. 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  Will hold off tonight and keep hydrating him as he just had a CTA of the head and neck.  Benign localized prostatic hyperplasia with lower urinary tract symptoms (LUTS) - We will continue Flomax.  Anxiety - We will continue Xanax.   Consultants: Neurology Procedures performed: None Disposition: Rehabilitation facility Diet recommendation:  Discharge Diet Orders (From admission, onward)     Start     Ordered   09/08/23 0000  Diet - low sodium heart healthy        09/08/23 1058           Cardiac diet DISCHARGE MEDICATION: Allergies as of 09/08/2023   No Known Allergies      Medication List     TAKE these medications    ALPRAZolam 1 MG tablet Commonly known as: XANAX TAKE 2 TABLETS (2 MG TOTAL) BY MOUTH NIGHTLY AS NEEDED FOR SLEEP   amitriptyline 25 MG tablet Commonly known as: ELAVIL   aspirin EC 81 MG tablet Take 1 tablet (81 mg total) by mouth daily. Swallow whole.   atorvastatin 40 MG tablet Commonly known as: LIPITOR Take 1 tablet (40 mg total) by mouth daily.   bisacodyl 10 MG suppository Commonly known as: DULCOLAX Place 1 suppository (10 mg total) rectally daily as needed for moderate constipation.   clopidogrel 75 MG tablet Commonly known as: PLAVIX   finasteride 5 MG tablet Commonly known as: PROSCAR Take 1 tablet (5 mg total) by mouth daily.   imipramine 50 MG tablet Commonly known as: TOFRANIL Take 50 mg by mouth at bedtime.   polyethylene glycol 17 g  packet Commonly known as: MIRALAX / GLYCOLAX Take 17 g by mouth daily.   tamsulosin 0.4 MG Caps capsule Commonly known as: FLOMAX TAKE 1 CAPSULE EVERY DAY   traZODone 100 MG tablet Commonly known as: DESYREL Take 100 mg by mouth at bedtime.        Follow-up Information     Marguarite Arbour, MD. Go on 09/15/2023.   Specialty: Internal Medicine Contact information: [address] [place] [address] [phone]                Discharge Exam: Ceasar Mons Weights   09/02/23 1221  Weight: 72.6 kg   General.  Frail elderly man, in no acute distress. Pulmonary.  Lungs clear bilaterally, normal respiratory effort. CV.  Regular rate and rhythm, no JVD, rub or murmur. Abdomen.  Soft, nontender, nondistended, BS positive. CNS.  Alert and oriented .  No focal neurologic deficit. Extremities.  No edema, no cyanosis, pulses intact and symmetrical. Psychiatry.  Judgment and insight appears normal.   Condition at discharge:  stable  The results of significant diagnostics from this hospitalization (including imaging, microbiology, ancillary and laboratory) are listed below for reference.   Imaging Studies: CT HEAD WO CONTRAST ( )  Result Date: 09/08/2023 CLINICAL DATA:  Stroke, follow-up.  Acute onset left-sided weakness. EXAM: CT HEAD WITHOUT CONTRAST TECHNIQUE: Contiguous axial images were obtained from the base of the skull through the vertex without intravenous contrast. RADIATION DOSE REDUCTION: This exam was performed according to the departmental dose-optimization program which includes automated exposure control, adjustment of the mA and/or kV according to patient size and/or use of iterative reconstruction technique. COMPARISON:  Head CT and CTA head/neck 09/02/2023. MRI brain 09/02/2023. FINDINGS: Brain: Increased conspicuity of the now subacute infarct in the right lateral thalamus. Additional foci of acute infarcts seen on recent brain MRI are below the resolution of  CT. No acute hemorrhage or significant mass effect. Stable background of severe chronic small-vessel disease. No new loss of gray-white differentiation. Unchanged bilateral hippocampal atrophy. No acute hydrocephalus or extra-axial collection. Vascular: No hyperdense vessel or unexpected calcification. Skull: No calvarial fracture or suspicious bone lesion. Skull base is unremarkable. Sinuses/Orbits: No acute finding. Other: None. IMPRESSION: 1. Increased conspicuity of the now subacute infarct in the right lateral thalamus. Additional foci of acute infarcts seen on recent brain MRI are below the resolution of CT. No acute hemorrhage or significant mass effect. 2. Stable background of severe chronic small-vessel disease. Electronically Signed   By: Orvan Falconer M.D.   On: 09/08/2023 11:28   ECHOCARDIOGRAM COMPLETE BUBBLE STUDY  Result Date: 09/03/2023    ECHOCARDIOGRAM REPORT   Patient Name:   James [name]. Date of Exam: 09/03/2023 Medical Rec #:  [id number]              Height:       68.0 in Accession #:    [id number]             Weight:       160.0 lb Date of Birth:  Apr 26, 1934              BSA:          1.859 m Patient Age:    89 years               BP:           107/58 mmHg Patient Gender: M                      HR:           49 bpm. Exam Location:  [facility] Procedure: 2D Echo, Cardiac Doppler, Color Doppler and Saline Contrast Bubble            Study Indications:     Stroke  History:         Patient has no prior history of Echocardiogram examinations.                  Stroke and PAD; Risk Factors:Hypertension and Dyslipidemia.  Sonographer:     Mikki Harbor Referring Phys:  [id number] JAN A MANSY Diagnosing Phys: Julien Nordmann MD  Sonographer Comments: Technically difficult study due to poor echo windows, suboptimal apical window and no subcostal window. Image acquisition challenging due to patient body habitus and Image acquisition challenging due to respiratory motion. IMPRESSIONS  1. Left  ventricular ejection fraction, by estimation, is 55 to 60%. The left ventricle has normal function. The left ventricle has no regional wall motion abnormalities. Left  ventricular diastolic parameters are consistent with Grade I diastolic dysfunction (impaired relaxation).  2. Right ventricular systolic function is normal. The right ventricular size is normal. There is normal pulmonary artery systolic pressure.  3. The mitral valve is normal in structure. No evidence of mitral valve regurgitation. No evidence of mitral stenosis.  4. The aortic valve has an indeterminant number of cusps. Aortic valve regurgitation is not visualized. Aortic valve sclerosis is present, with no evidence of aortic valve stenosis.  5. The inferior vena cava is normal in size with greater than 50% respiratory variability, suggesting right atrial pressure of 3 mmHg.  6. Agitated saline contrast bubble study was negative, with no evidence of any interatrial shunt. FINDINGS  Left Ventricle: Left ventricular ejection fraction, by estimation, is 55 to 60%. The left ventricle has normal function. The left ventricle has no regional wall motion abnormalities. The left ventricular internal cavity size was normal in size. There is  no left ventricular hypertrophy. Left ventricular diastolic parameters are consistent with Grade I diastolic dysfunction (impaired relaxation). Right Ventricle: The right ventricular size is normal. No increase in right ventricular wall thickness. Right ventricular systolic function is normal. There is normal pulmonary artery systolic pressure. The tricuspid regurgitant velocity is 1.71 m/s, and  with an assumed right atrial pressure of 3 mmHg, the estimated right ventricular systolic pressure is 14.7 mmHg. Left Atrium: Left atrial size was normal in size. Right Atrium: Right atrial size was normal in size. Pericardium: There is no evidence of pericardial effusion. Mitral Valve: The mitral valve is normal in structure. No  evidence of mitral valve regurgitation. No evidence of mitral valve stenosis. MV peak gradient, 2.5 mmHg. The mean mitral valve gradient is 1.0 mmHg. Tricuspid Valve: The tricuspid valve is normal in structure. Tricuspid valve regurgitation is not demonstrated. No evidence of tricuspid stenosis. Aortic Valve: The aortic valve has an indeterminant number of cusps. Aortic valve regurgitation is not visualized. Aortic valve sclerosis is present, with no evidence of aortic valve stenosis. Aortic valve mean gradient measures 2.0 mmHg. Aortic valve peak gradient measures 2.8 mmHg. Aortic valve area, by VTI measures 2.09 cm. Pulmonic Valve: The pulmonic valve was normal in structure. Pulmonic valve regurgitation is not visualized. No evidence of pulmonic stenosis. Aorta: The aortic root is normal in size and structure. Venous: The inferior vena cava is normal in size with greater than 50% respiratory variability, suggesting right atrial pressure of 3 mmHg. IAS/Shunts: No atrial level shunt detected by color flow Doppler. Agitated saline contrast was given intravenously to evaluate for intracardiac shunting. Agitated saline contrast bubble study was negative, with no evidence of any interatrial shunt. There  is no evidence of a patent foramen ovale. There is no evidence of an atrial septal defect.  LEFT VENTRICLE PLAX 2D LVIDd:         3.90 cm   Diastology LVIDs:         2.60 cm   LV e' medial:    7.18 cm/s LV PW:         1.00 cm   LV E/e' medial:  7.8 LV IVS:        1.10 cm   LV e' lateral:   9.90 cm/s LVOT diam:     1.90 cm   LV E/e' lateral: 5.7 LV SV:         49 LV SV Index:   26 LVOT Area:     2.84 cm  RIGHT VENTRICLE RV Basal diam:  2.90 cm  RV Mid diam:    3.20 cm RV S prime:     13.40 cm/s TAPSE (M-mode): 2.2 cm LEFT ATRIUM           Index        RIGHT ATRIUM           Index LA diam:      3.30 cm 1.78 cm/m   RA Area:     15.60 cm LA Vol (A4C): 36.1 ml 19.42 ml/m  RA Volume:   36.50 ml  19.64 ml/m  AORTIC VALVE                     PULMONIC VALVE AV Area (Vmax):    2.18 cm     PV Vmax:       0.76 m/s AV Area (Vmean):   2.01 cm     PV Peak grad:  2.3 mmHg AV Area (VTI):     2.09 cm AV Vmax:           84.10 cm/s AV Vmean:          61.800 cm/s AV VTI:            0.233 m AV Peak Grad:      2.8 mmHg AV Mean Grad:      2.0 mmHg LVOT Vmax:         64.70 cm/s LVOT Vmean:        43.800 cm/s LVOT VTI:          0.172 m LVOT/AV VTI ratio: 0.74  AORTA Ao Root diam: 3.40 cm Ao Asc diam:  3.00 cm MITRAL VALVE               TRICUSPID VALVE MV Area (PHT): 3.56 cm    TR Peak grad:   11.7 mmHg MV Area VTI:   1.77 cm    TR Vmax:        171.00 cm/s MV Peak grad:  2.5 mmHg MV Mean grad:  1.0 mmHg    SHUNTS MV Vmax:       0.78 m/s    Systemic VTI:  0.17 m MV Vmean:      37.0 cm/s   Systemic Diam: 1.90 cm MV Decel Time: 213 msec MV E velocity: 56.10 cm/s MV A velocity: 58.70 cm/s MV E/A ratio:  0.96 Julien Nordmann MD Electronically signed by Julien Nordmann MD Signature Date/Time: 09/03/2023/6:17:41 PM    Final    CT ANGIO HEAD NECK W WO CM  Result Date: 09/02/2023 CLINICAL DATA:  Stroke/TIA, determine embolic source EXAM: CT ANGIOGRAPHY HEAD AND NECK WITH AND WITHOUT CONTRAST TECHNIQUE: Multidetector CT imaging of the head and neck was performed using the standard protocol during bolus administration of intravenous contrast. Multiplanar CT image reconstructions and MIPs were obtained to evaluate the vascular anatomy. Carotid stenosis measurements (when applicable) are obtained utilizing NASCET criteria, using the distal internal carotid diameter as the denominator. RADIATION DOSE REDUCTION: This exam was performed according to the departmental dose-optimization program which includes automated exposure control, adjustment of the mA and/or kV according to patient size and/or use of iterative reconstruction technique. CONTRAST:  75mL OMNIPAQUE IOHEXOL 350 MG/ML SOLN COMPARISON:  Same day CT head and MRI head. FINDINGS: CTA NECK FINDINGS Aortic  arch: Great vessel origins are patent. Right carotid system: Atherosclerosis at the carotid bifurcation without greater than 50% stenosis. Left carotid system: Atherosclerosis at the carotid bifurcation without greater than 50% stenosis. Vertebral arteries: Right dominant. Rim both vertebral  arteries are patent without significant (greater than 50%) stenosis in the neck. Skeleton: No acute abnormality on limited assessment. Other neck: No acute abnormality on limited assessment. 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 Review of the MIP images confirms the above findings CTA HEAD FINDINGS Anterior circulation: Bilateral intracranial ICAs, Posterior circulation: Moderate stenosis of the non dominant left intradural vertebral artery at its dural margin. The dominant right vertebral artery and basilar artery are patent without significant stenosis. Occlusion versus critical stenosis of the right P2 PCA with poor/irregular distal opacification. Severe distal left P2 PCA stenosis. Venous sinuses: As permitted by contrast timing, patent. Review of the MIP images confirms the above findings IMPRESSION: 1. Occlusion versus critical stenosis of the right P2 PCA with poor/irregular distal opacification. 2. Severe distal left P2 PCA stenosis. 3. Moderate stenosis of the non dominant left intradural vertebral artery at its dural margin. 4. 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 A follow-up CTA of the chest is recommended to fully characterize and to determine follow-up recommendations. Findings discussed with Dr. Lenard Lance via telephone at 9:24 p.m. Electronically Signed   By: Feliberto Harts M.D.   On: 09/02/2023 21:26   MR BRAIN WO CONTRAST  Result Date: 09/02/2023 CLINICAL DATA:  Neuro deficit, acute, stroke suspected acute right EXAM: MRI HEAD WITHOUT CONTRAST TECHNIQUE: Multiplanar, multiecho pulse sequences of the brain  and surrounding structures were obtained without intravenous contrast. COMPARISON:  Same day CT head. FINDINGS: Brain: Acute right thalamocapsular infarct. Mild edema without mass effect. Two punctate acute infarcts in the right occipital lobe and possible small subacute infarct in the left frontal white matter. Additional scattered T2/FLAIR hyperintensities in the white matter, compatible with chronic microvascular ischemic change. No evidence acute hemorrhage, mass lesion, midline shift or hydrocephalus. Cerebral atrophy. Vascular: Major arterial flow voids are maintained skull base. Skull and upper cervical spine: Normal marrow signal. Sinuses/Orbits: Clear sinuses.  No acute orbital findings. Other: No mastoid effusions. IMPRESSION: 1. Acute right thalamocapsular infarct. 2. Two punctate acute infarcts in the right occipital lobe and possible small subacute infarct in the left frontal white matter. 3. Chronic microvascular ischemic disease. Electronically Signed   By: Feliberto Harts M.D.   On: 09/02/2023 18:52   CT Head Wo Contrast  Result Date: 09/02/2023 CLINICAL DATA:  Neuro deficit, acute, stroke suspected EXAM: CT HEAD WITHOUT CONTRAST TECHNIQUE: Contiguous axial images were obtained from the base of the skull through the vertex without intravenous contrast. RADIATION DOSE REDUCTION: This exam was performed according to the departmental dose-optimization program which includes automated exposure control, adjustment of the mA and/or kV according to patient size and/or use of iterative reconstruction technique. COMPARISON:  CT May 09, 2023. FINDINGS: Brain: Extensive patchy white matter hypodensities are nonspecific but compatible with chronic microvascular ischemic disease. Remote lacunar infarct in the left thalamus. No evidence of acute large vascular territory infarct, acute hemorrhage, mass lesion, midline shift or hydrocephalus. Cerebral atrophy which is temporal lobe predominant (particularly on  the right). Vascular: No hyperdense vessel. Skull: No acute fracture. Sinuses/Orbits: No acute finding. IMPRESSION: 1. No evidence of acute intracranial abnormality. 2. Chronic microvascular ischemic disease. 3. Temporal lobe predominant cerebral atrophy (ICD10-G31.9), which can be seen with Alzheimer's disease. Electronically Signed   By: Feliberto Harts M.D.   On: 09/02/2023 14:36    Microbiology: Results for orders placed or performed during the hospital encounter of 09/02/23  Resp  panel by RT-PCR (RSV, Flu A&B, Covid) Urine, Clean Catch     Status: None   Collection Time: 09/02/23  3:56 PM   Specimen: Urine, Clean Catch; Nasal Swab  Result Value Ref Range Status   SARS Coronavirus 2 by RT PCR NEGATIVE NEGATIVE Final    Comment: (NOTE) SARS-CoV-2 target nucleic acids are NOT DETECTED.  The SARS-CoV-2 RNA is generally detectable in upper respiratory specimens during the acute phase of infection. The lowest concentration of SARS-CoV-2 viral copies this assay can detect is 138 copies/mL. A negative result does not preclude SARS-Cov-2 infection and should not be used as the sole basis for treatment or other patient management decisions. A negative result may occur with  improper specimen collection/handling, submission of specimen other than nasopharyngeal swab, presence of viral mutation(s) within the areas targeted by this assay, and inadequate number of viral copies(<138 copies/mL). A negative result must be combined with clinical observations, patient history, and epidemiological information. The expected result is Negative.  Fact Sheet for Patients:  BloggerCourse.com  Fact Sheet for Healthcare Providers:  SeriousBroker.it  This test is no t yet approved or cleared by the [place] FDA and  has been authorized for detection and/or diagnosis of SARS-CoV-2 by FDA under an Emergency Use Authorization (EUA). This EUA will remain   in effect (meaning this test can be used) for the duration of the COVID-19 declaration under Section 564(b)(1) of the Act, 21 U.S.C.section 360bbb-3(b)(1), unless the authorization is terminated  or revoked sooner.       Influenza A by PCR NEGATIVE NEGATIVE Final   Influenza B by PCR NEGATIVE NEGATIVE Final    Comment: (NOTE) The Xpert Xpress SARS-CoV-2/FLU/RSV plus assay is intended as an aid in the diagnosis of influenza from Nasopharyngeal swab specimens and should not be used as a sole basis for treatment. Nasal washings and aspirates are unacceptable for Xpert Xpress SARS-CoV-2/FLU/RSV testing.  Fact Sheet for Patients: BloggerCourse.com  Fact Sheet for Healthcare Providers: SeriousBroker.it  This test is not yet approved or cleared by the [place] FDA and has been authorized for detection and/or diagnosis of SARS-CoV-2 by FDA under an Emergency Use Authorization (EUA). This EUA will remain in effect (meaning this test can be used) for the duration of the COVID-19 declaration under Section 564(b)(1) of the Act, 21 U.S.C. section 360bbb-3(b)(1), unless the authorization is terminated or revoked.     Resp Syncytial Virus by PCR NEGATIVE NEGATIVE Final    Comment: (NOTE) Fact Sheet for Patients: BloggerCourse.com  Fact Sheet for Healthcare Providers: SeriousBroker.it  This test is not yet approved or cleared by the [place] FDA and has been authorized for detection and/or diagnosis of SARS-CoV-2 by FDA under an Emergency Use Authorization (EUA). This EUA will remain in effect (meaning this test can be used) for the duration of the COVID-19 declaration under Section 564(b)(1) of the Act, 21 U.S.C. section 360bbb-3(b)(1), unless the authorization is terminated or revoked.  Performed at [facility], [address] [place], [place] [address]      Labs: CBC: Recent Labs  Lab 09/02/23 1227 09/03/23 0428  WBC 5.8 5.5  NEUTROABS 4.0  --   HGB 15.0 12.8*  HCT 46.1 38.4*  MCV 91.7 90.6  PLT 170 163   Basic Metabolic Panel: Recent Labs  Lab 09/02/23 1227 09/03/23 0428  NA 138 136  K 3.9 3.9  CL 104 105  CO2 24 25  GLUCOSE 107* 96  BUN 24* 27*  CREATININE 1.31* 1.16  CALCIUM 9.4 8.6*   Liver Function Tests: Recent Labs  Lab 09/02/23 1227  AST 25  ALT 17  ALKPHOS 74  BILITOT 1.1  PROT 9.0*  ALBUMIN 4.3   CBG: No results for input(s): "GLUCAP" in the last 168 hours.  Discharge time spent: greater than 30 minutes.  This record has been created using Conservation officer, historic buildings. Errors have been sought and corrected,but may not always be located. Such creation errors do not reflect on the standard of care.   Signed: Arnetha Courser, MD [facility] 09/08/2023

## 2023-09-08 NOTE — Progress Notes (Signed)
Patient arrived on unit via [facility];oriented to unit, denies pain, skin assessment completed x2RN. Vitals WDL. All questions answered

## 2023-09-08 NOTE — Progress Notes (Signed)
Signed     Expand All Collapse All PMR Admission Coordinator Pre-Admission Assessment   Patient: James [name]. is an 89 y.o., male MRN: [id number] DOB: 1934/04/03 Height: 5\' 8"  (172.7 cm) Weight: 72.6 kg                                                                                                                                                  Insurance Information HMO:     PPO: yes     PCP:      IPA:      80/20:      OTHER:  PRIMARY: [employer] Medicare      Policy#: [id number]      Subscriber: patient CM Name: TBD      Phone#: TBD     Fax#: [phone] Pre-Cert#: [id number]  Received authorization from [employer] on 09/07/23. Begins 9/24-9/30. Updates due on 09/14/23. Home & [facility] Auth #: [id number]     Employer:  Benefits:  Phone #: [email or web address]     Name:  Eff. Date: 12/15/22     Deduct: does not have one      Out of Pocket Max: $900 ($435 met)      Life Max: NA  CIR: $100 co-pay/admission      SNF: 100% coverage for days 1-20, $80 co-pay/day for days 21-31, 100% coverage for days 32-100 Outpatient: $30 co-pay/visit     Co-Pay:  Home Health: 100% coverage      Co-Pay:  DME: 80% coverage     Co-Pay: 20% co-insurance Providers: in-network SECONDARY:       Policy#:       Phone#:    Artist:       Phone#:    The Data processing manager" for patients in Inpatient Rehabilitation Facilities with attached "Privacy Act Statement-Health Care Records" was provided and verbally reviewed with: Patient and Family   Emergency Contact Information Contact Information       Name Relation Home Work [place] [phone]    Nyal, Ebaugh Daughter     [phone]    Milas Kocher     [phone]         Other Contacts       Name Relation Home Work Mobile    Snail,Margret Daughter     [phone]         Current Medical History  Patient Admitting Diagnosis: CVA History of Present  Illness: Pt is an 89 year old male with medical hx significant for: anxiety, prior TIA, HTN, hyperlipidemia, h/o lacunar infarction with dysarthria and disequilibrium (08/14/23), polyneuropathy, mild cognitive impairment. Pt [facility] to [facility] on 09/02[facility]23 d/t left arm weakness and numbness in left  hand. Pt not a candidate for TNK. CT head negative for acute abnormalities. MRI revealed right thalamic capsular infarct and  two punctate acute infarcts in the right occipital lobe and possible small subacute infarct in left frontal white matter with chronic microvascular ischemic changes. 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 Therapy evaluations completed and CIR recommended d/t pt's deficits in functional mobiltiy. Complete NIHSS TOTAL: 4   Patient's medical record from [facility] has been reviewed by the rehabilitation admission coordinator and physician.   Past Medical History      Past Medical History:  Diagnosis Date   Anxiety     TIA (transient ischemic attack)            Has the patient had major surgery during 100 days prior to admission? No   Family History  family history includes Prostate cancer in his father.     Current Medications   Current Medications    Current Facility-Administered Medications:    acetaminophen (TYLENOL) tablet 650 mg, 650 mg, Oral, Q6H PRN, 650 mg at 09/04/23 2251 **OR** acetaminophen (TYLENOL) suppository 650 mg, 650 mg, Rectal, Q6H PRN, Mansy, Jan A, MD   ALPRAZolam Prudy Feeler) tablet 1 mg, 1 mg, Oral, BID PRN, Darlin Priestly, MD, 1 mg at 09/07/23 2134   aspirin EC tablet 81 mg, 81 mg, Oral, Daily, Mansy, Jan A, MD, 81 mg at 09/07/23 2440   atorvastatin (LIPITOR) tablet 40 mg, 40 mg, Oral, Daily, Jaynie Bream, RPH, 40 mg at 09/07/23 1027   bisacodyl (DULCOLAX) suppository 10 mg, 10 mg, Rectal, Daily PRN, Arnetha Courser, MD, 10 mg at 09/06/23 2250   clopidogrel (PLAVIX) tablet 75 mg, 75 mg, Oral, Daily, Mansy, Jan A, MD, 75 mg at 09/07/23 2536   enoxaparin (LOVENOX) injection 40 mg, 40 mg, Subcutaneous, Q24H, Mansy, Jan A, MD, 40 mg at 09/07/23 2135   finasteride (PROSCAR) tablet 5 mg, 5 mg, Oral, Daily, Mansy, Jan A, MD, 5 mg at 09/07/23 6440   magnesium hydroxide (MILK OF MAGNESIA) suspension 30 mL, 30 mL, Oral, Daily PRN, Mansy, Jan A, MD, 30 mL at 09/04/23 1737   ondansetron (ZOFRAN) tablet 4 mg, 4 mg, Oral, Q6H PRN **OR** ondansetron (ZOFRAN) injection 4 mg, 4 mg, Intravenous, Q6H PRN, Mansy, Jan A, MD   polyethylene glycol (MIRALAX / GLYCOLAX) packet 17 g, 17 g, Oral, Daily, Amin, Tilman Neat, MD, 17 g at 09/07/23 3474   tamsulosin (FLOMAX) capsule 0.4 mg, 0.4 mg, Oral, Daily, Mansy, Jan A, MD, 0.4 mg at 09/07/23 2595   traZODone (DESYREL) tablet 100 mg, 100 mg, Oral, QHS, Mansy, Jan A, MD, 100 mg at 09/07/23 2134   traZODone (DESYREL) tablet 25 mg, 25 mg, Oral, QHS PRN, Mansy, Vernetta Honey, MD     Patients Current Diet:  Diet Order                  Diet regular Room service appropriate? Yes with Assist; Fluid consistency: Thin  Diet effective now                         Precautions / Restrictions Precautions Precautions: Fall Restrictions Weight Bearing Restrictions: No    Has the patient had 2 or more falls or a fall with injury in the past year?Yes   Prior Activity Level Limited Community (1-2x/wk): gets out of house ~1 day/week   Prior Functional  Level Prior Function Prior Level of Function : Independent/Modified Independent Mobility Comments: Mod indep with RW (longer distances) versus SPC (shorter-distances); denies recent fall history.  No driving.  Was actively participating with HHPT prior to admission ADLs Comments: Mod indep with ADLs and light meal prep; does have  aide that comes regularly to assist patient/wife.   Self Care: Did the patient need help bathing, dressing, using the toilet or eating?  Independent   Indoor Mobility: Did the patient need assistance with walking from room to room (with or without device)? Independent   Stairs: Did the patient need assistance with internal or external stairs (with or without device)? Needed some help   Functional Cognition: Did the patient need help planning regular tasks such as shopping or remembering to take medications? Needed some help   Patient Information Are you of Hispanic, Latino/a,or Spanish origin?: A. No, not of Hispanic, Latino/a, or Spanish origin What is your race?: A. White Do you need or want an interpreter to communicate with a doctor or health care staff?: 0. No   Patient's Response To:  Health Literacy and Transportation Is the patient able to respond to health literacy and transportation needs?: Yes Health Literacy - How often do you need to have someone help you when you read instructions, pamphlets, or other written material from your doctor or pharmacy?: Never In the past 12 months, has lack of transportation kept you from medical appointments or from getting medications?: No In the past 12 months, has lack of transportation kept you from meetings, work, or from getting things needed for daily living?: No   Home Assistive Devices / Equipment Home Assistive Devices/Equipment: Eyeglasses Home Equipment: Agricultural consultant (2 wheels), [employer] - single point, Information systems manager - built in, Information systems manager   Prior Device Use: Indicate devices/aids used by the patient prior to current illness, exacerbation or injury? Walker and cane   Current Functional Level Cognition   Arousal/Alertness: Awake/alert Overall Cognitive Status: Within Functional Limits for tasks assessed Difficult to assess due to: Hard of hearing/deaf Orientation Level: Oriented X4 Following Commands: Follows one step commands  inconsistently, Follows one step commands with increased time Safety/Judgement: Decreased awareness of safety, Decreased awareness of deficits General Comments: very talkative Attention: Focused, Sustained Focused Attention: Appears intact Sustained Attention: Appears intact Memory: Impaired Memory Impairment: Decreased recall of new information, Decreased short term memory Decreased Short Term Memory: Verbal complex Awareness: Appears intact (grossly) Problem Solving: Impaired Problem Solving Impairment: Verbal complex (anticipatory) Executive Function: Reasoning, Sequencing, Organizing Reasoning: Appears intact Sequencing: Impaired Sequencing Impairment: Verbal complex Organizing: Impaired Organizing Impairment: Verbal complex, Functional complex Behaviors: Impulsive (w/ getting up to go to the bathroom) Safety/Judgment: Impaired Comments: min impulsive w/ decreased awareness of self, safety    Extremity Assessment (includes Sensation/Coordination)   Upper Extremity Assessment: Right hand dominant, LUE deficits/detail LUE Coordination: decreased gross motor, decreased fine motor (inattention to L, did not drop items during tx, but difficulty with FM task of opening toothpaste)  Lower Extremity Assessment: Defer to PT evaluation (motor planning difficulties)     ADLs   Overall ADL's : Needs assistance/impaired Grooming: Wash/dry hands, Standing, Minimal assistance Grooming Details (indicate cue type and reason): Items placed on pt's L side and noted to compensate by turning his entire head to find items Upper Body Bathing: Set up Lower Body Dressing: Minimal assistance, Cueing for sequencing Lower Body Dressing Details (indicate cue type and reason): fixes socks seated on commode Toilet Transfer: Minimal assistance Toilet Transfer Details (indicate  cue type and reason): using RW, pt forward lean requiring cues to correct Toileting- Clothing Manipulation and Hygiene: Contact  guard assist, Cueing for sequencing, Cueing for safety, Sit to/from stand Toileting - Clothing Manipulation Details (indicate cue type and reason): needs cues to pull pants completely off hips Functional mobility during ADLs: Cueing for sequencing, Cueing for safety, Rolling walker (2 wheels) General ADL Comments: Pt talkative, cues to redirect, impaired STM, poor awareness of deficits and environmental safety     Mobility   Overal bed mobility: Needs Assistance Bed Mobility: Supine to Sit, Sit to Supine Supine to sit: Max assist (per RN when getting to EOB to eat) Sit to supine: Min assist General bed mobility comments: pt getting up OOB with alarm going off, requires cuing to return to supine centered in bed as pt initally lies down diagnolly     Transfers   Overall transfer level: Needs assistance Equipment used: Rolling walker (2 wheels) Transfers: Sit to/from Stand Sit to Stand: Min assist Bed to/from chair/wheelchair/BSC transfer type:: Step pivot Stand pivot transfers: Min assist Step pivot transfers: Min assist General transfer comment: manual cuing for postural extension/orientation to midline, multimodal cuing for technique of RW     Ambulation / Gait / Stairs / Wheelchair Mobility   Ambulation/Gait Ambulation/Gait assistance: Min assist, +2 safety/equipment Gait Distance (Feet): 20 Feet Assistive device: Rolling walker (2 wheels) Gait Pattern/deviations: Step-through pattern, Shuffle, Staggering left, Staggering right, Drifts right/left, Narrow base of support, Decreased step length - right, Decreased step length - left, Steppage General Gait Details: generlly fatiegued with increased forward lean today Gait velocity: decreased     Posture / Balance Dynamic Sitting Balance Sitting balance - Comments: excessive forward and left lean today Balance Overall balance assessment: Needs assistance Sitting-balance support: Bilateral upper extremity supported, Feet  unsupported Sitting balance-Leahy Scale: Fair Sitting balance - Comments: excessive forward and left lean today Postural control: Left lateral lean Standing balance support: Bilateral upper extremity supported Standing balance-Leahy Scale: Poor Standing balance comment: forward lean on walker with +2 assist for safety Standardized Balance Assessment Standardized Balance Assessment : Berg Balance Test Berg Balance Test Sit to Stand: Able to stand using hands after several tries Standing Unsupported: Able to stand 2 minutes with supervision Sitting with Back Unsupported but Feet Supported on Floor or Stool: Able to sit 2 minutes under supervision Stand to Sit: Uses backs of legs against chair to control descent Transfers: Needs one person to assist Standing Unsupported with Eyes Closed: Able to stand 3 seconds Standing Ubsupported with Feet Together: Able to place feet together independently but unable to hold for 30 seconds From Standing, Reach Forward with Outstretched Arm: Reaches forward but needs supervision From Standing Position, Pick up Object from Floor: Unable to try/needs assist to keep balance From Standing Position, Turn to Look Behind Over each Shoulder: Needs supervision when turning Turn 360 Degrees: Needs assistance while turning Standing Unsupported, Alternately Place Feet on Step/Stool: Needs assistance to keep from falling or unable to try Standing Unsupported, One Foot in Front: Loses balance while stepping or standing Standing on One Leg: Unable to try or needs assist to prevent fall Total Score: 17     Special needs/care consideration Skin Abrasion: arm, elbow/left        Previous Home Environment (from acute therapy documentation) Living Arrangements: Spouse/significant other  Lives With: Spouse Available Help at Discharge: Family, Available 24 hours/day (per report) Type of Home: House Home Layout: Able to live on main level with bedroom/bathroom  Alternate  Level Stairs-Number of Steps: no essential needs on upper level of home Home Access: Stairs to enter Entrance Stairs-Rails: None Entrance Stairs-Number of Steps: 1 Bathroom Shower/Tub: Health visitor: Standard Bathroom Accessibility: Yes How Accessible: Accessible via walker Home Care Services: No   Discharge Living Setting Plans for Discharge Living Setting: Patient's home Type of Home at Discharge: House Discharge Home Layout: Able to live on main level with bedroom/bathroom Discharge Home Access: Stairs to enter Entrance Stairs-Rails: None Entrance Stairs-Number of Steps: 1 Discharge Bathroom Shower/Tub: Walk-in shower Discharge Bathroom Toilet: Standard Discharge Bathroom Accessibility: Yes How Accessible: Accessible via walker Does the patient have any problems obtaining your medications?: No   Social/Family/Support Systems Anticipated Caregiver: Claris Che (daughter) and other children Anticipated Caregiver's Contact Information: Claris Che: [phone] Caregiver Availability: 24/7 Discharge Plan Discussed with Primary Caregiver: Yes Is Caregiver In Agreement with Plan?: Yes Does Caregiver/Family have Issues with Lodging/Transportation while Pt is in Rehab?: No     Goals Patient/Family Goal for Rehab: Supervision: PT/OT/ST Expected length of stay: 7-10 days Pt/Family Agrees to Admission and willing to participate: Yes Program Orientation Provided & Reviewed with Pt/Caregiver Including Roles  & Responsibilities: Yes     Decrease burden of Care through IP rehab admission: NA     Possible need for SNF placement upon discharge:Not anticipated     Patient Condition: This patient's medical and functional status has changed since the consult dated: 09/04/23 in which the Rehabilitation Physician determined and documented that the patient's condition is appropriate for intensive rehabilitative care in an inpatient rehabilitation facility. See "History of Present  Illness" (above) for medical update. Functional changes are: mobility has improved to Min A. Patient's medical and functional status update has been discussed with the Rehabilitation physician and patient remains appropriate for inpatient rehabilitation. Will admit to inpatient rehab today.   Preadmission Screen Completed By:  Domingo Pulse, CCC-SLP, 09/08/2023 10:09 AM ______________________________________________________________________   Discussed status with Dr. Fredia Beets on 09/08/23  at 10:43 AM and received approval for admission today.   Admission Coordinator:  Domingo Pulse, time 10:43 AM/Date 09/08/23

## 2023-09-08 NOTE — Plan of Care (Signed)
Problem: Education: Goal: Knowledge of disease or condition will improve 09/08/2023 0609 by Tally Joe, RN Outcome: Progressing 09/08/2023 0608 by Tally Joe, RN Outcome: Progressing Goal: Knowledge of secondary prevention will improve (MUST DOCUMENT ALL) 09/08/2023 0609 by Tally Joe, RN Outcome: Progressing 09/08/2023 0608 by Tally Joe, RN Outcome: Progressing Goal: Knowledge of patient specific risk factors will improve Loraine Leriche N/A or DELETE if not current risk factor) 09/08/2023 0609 by Tally Joe, RN Outcome: Progressing 09/08/2023 0608 by Tally Joe, RN Outcome: Progressing   Problem: Ischemic Stroke/TIA Tissue Perfusion: Goal: Complications of ischemic stroke/TIA will be minimized 09/08/2023 0609 by Tally Joe, RN Outcome: Progressing 09/08/2023 0608 by Tally Joe, RN Outcome: Progressing   Problem: Coping: Goal: Will verbalize positive feelings about self 09/08/2023 0609 by Tally Joe, RN Outcome: Progressing 09/08/2023 0608 by Tally Joe, RN Outcome: Progressing Goal: Will identify appropriate support needs 09/08/2023 0609 by Tally Joe, RN Outcome: Progressing 09/08/2023 0608 by Tally Joe, RN Outcome: Progressing   Problem: Health Behavior/Discharge Planning: Goal: Ability to manage health-related needs will improve 09/08/2023 0609 by Tally Joe, RN Outcome: Progressing 09/08/2023 0608 by Tally Joe, RN Outcome: Progressing Goal: Goals will be collaboratively established with patient/family 09/08/2023 [phone] by Tally Joe, RN Outcome: Progressing 09/08/2023 0608 by Tally Joe, RN Outcome: Progressing   Problem: Self-Care: Goal: Ability to participate in self-care as condition permits will improve 09/08/2023 0609 by Tally Joe, RN Outcome: Progressing 09/08/2023 0608 by Tally Joe, RN Outcome: Progressing Goal: Verbalization of feelings and concerns over difficulty with self-care will improve 09/08/2023 0609 by Tally Joe,  RN Outcome: Progressing 09/08/2023 0608 by Tally Joe, RN Outcome: Progressing Goal: Ability to communicate needs accurately will improve 09/08/2023 0609 by Tally Joe, RN Outcome: Progressing 09/08/2023 0608 by Tally Joe, RN Outcome: Progressing   Problem: Nutrition: Goal: Risk of aspiration will decrease 09/08/2023 0609 by Tally Joe, RN Outcome: Progressing 09/08/2023 0608 by Tally Joe, RN Outcome: Progressing Goal: Dietary intake will improve 09/08/2023 0609 by Tally Joe, RN Outcome: Progressing 09/08/2023 0608 by Tally Joe, RN Outcome: Progressing   Problem: Education: Goal: Knowledge of General Education information will improve Description: Including pain rating scale, medication(s)/side effects and non-pharmacologic comfort measures 09/08/2023 0609 by Tally Joe, RN Outcome: Progressing 09/08/2023 0608 by Tally Joe, RN Outcome: Progressing   Problem: Health Behavior/Discharge Planning: Goal: Ability to manage health-related needs will improve 09/08/2023 0609 by Tally Joe, RN Outcome: Progressing 09/08/2023 0608 by Tally Joe, RN Outcome: Progressing   Problem: Clinical Measurements: Goal: Ability to maintain clinical measurements within normal limits will improve 09/08/2023 0609 by Tally Joe, RN Outcome: Progressing 09/08/2023 0608 by Tally Joe, RN Outcome: Progressing Goal: Will remain free from infection 09/08/2023 0609 by Tally Joe, RN Outcome: Progressing 09/08/2023 0608 by Tally Joe, RN Outcome: Progressing Goal: Diagnostic test results will improve 09/08/2023 0609 by Tally Joe, RN Outcome: Progressing 09/08/2023 0608 by Tally Joe, RN Outcome: Progressing Goal: Respiratory complications will improve 09/08/2023 0609 by Tally Joe, RN Outcome: Progressing 09/08/2023 0608 by Tally Joe, RN Outcome: Progressing Goal: Cardiovascular complication will be avoided 09/08/2023 0609 by Tally Joe, RN Outcome:  Progressing 09/08/2023 0608 by Tally Joe, RN Outcome: Progressing   Problem: Activity: Goal: Risk for activity intolerance will decrease 09/08/2023 0609 by Tally Joe, RN Outcome: Progressing  09/08/2023 0981 by Tally Joe, RN Outcome: Progressing   Problem: Nutrition: Goal: Adequate nutrition will be maintained 09/08/2023 0609 by Tally Joe, RN Outcome: Progressing 09/08/2023 0608 by Tally Joe, RN Outcome: Progressing   Problem: Coping: Goal: Level of anxiety will decrease 09/08/2023 0609 by Tally Joe, RN Outcome: Progressing 09/08/2023 0608 by Tally Joe, RN Outcome: Progressing   Problem: Elimination: Goal: Will not experience complications related to bowel motility 09/08/2023 0609 by Tally Joe, RN Outcome: Progressing 09/08/2023 0608 by Tally Joe, RN Outcome: Progressing Goal: Will not experience complications related to urinary retention 09/08/2023 0609 by Tally Joe, RN Outcome: Progressing 09/08/2023 0608 by Tally Joe, RN Outcome: Progressing   Problem: Pain Managment: Goal: General experience of comfort will improve 09/08/2023 0609 by Tally Joe, RN Outcome: Progressing 09/08/2023 0608 by Tally Joe, RN Outcome: Progressing   Problem: Safety: Goal: Ability to remain free from injury will improve 09/08/2023 0609 by Tally Joe, RN Outcome: Progressing 09/08/2023 0608 by Tally Joe, RN Outcome: Progressing   Problem: Skin Integrity: Goal: Risk for impaired skin integrity will decrease 09/08/2023 0609 by Tally Joe, RN Outcome: Progressing 09/08/2023 0608 by Tally Joe, RN Outcome: Progressing

## 2023-09-08 NOTE — Progress Notes (Signed)
Inpatient Rehabilitation Admission Medication Review by a Pharmacist  A complete drug regimen review was completed for this patient to identify any potential clinically significant medication issues.  High Risk Drug Classes Is patient taking? Indication by Medication  Antipsychotic No   Anticoagulant Yes Enoxaparin - VTE prophylaxis  Antibiotic No   Opioid No   Antiplatelet Yes Aspirin 81 mg, clopidogrel - CVA prophylaxis  Hypoglycemics/insulin No   Vasoactive Medication No   Chemotherapy No   Other Yes Atorvastatin - hyperlipidemia Finasteride, tamsulosin - BPH Trazodone - anxiety Miralax - laxative  PRNs: Acetaminophen - mild pain or fever >/= 101F Alprazolam - anxiety Bisacodyl - constipation Ondansetron - nausea     Type of Medication Issue Identified Description of Issue Recommendation(s)  Drug Interaction(s) (clinically significant)     Duplicate Therapy     Allergy     No Medication Administration End Date  Aspirin and Clopidogrel planned for 3 months, then aspirin alone.  On clopidogrel prior to admission. Aspirin begun 09/04/23 > expect clopidogrel to stop 12/04/23.  Incorrect Dose     Additional Drug Therapy Needed     Significant med changes from prior encounter (inform family/care partners about these prior to discharge). New aspirin, atorvastatin and miralax. Communicate changes with patient/family prior to discharge.  Other  Off prior Amitriptyline and Imipramine. Most recently filled is Imipramine. Amitriptyline does not appear to be a recent medication. Duplicate therapeutic class. Held during inpatient admission, but to resume per discharge summary. Would not resume either medication due to risks in elderly.      Clinically significant medication issues were identified that warrant physician communication and completion of prescribed/recommended actions by midnight of the next day:  No  Pharmacist comments:  - noted plan for Aspirin and Clopidogrel for  3 months, then aspirin alone. - was on clopidogrel prior to admission; aspirin begun 09/04/23  Time spent performing this drug regimen review (minutes):  20   Dennie Fetters, [place] 09/08/2023 3:56 PM

## 2023-09-08 NOTE — Progress Notes (Signed)
Signed     Expand All Collapse All          Physical Medicine and Rehabilitation Consult Reason for Consult: CVA Referring Physician: Arnetha Courser, MD     HPI: James [name]. is a 89 y.o. male who presented to the ER 2/2 left sided weakness and was admitted with R thalamocapsular, R occipital, and L frontal infarcts. Physical Medicine & Rehabilitation was consulted to assess candidacy for CIR.       ROS +left sided weakness     Past Medical History:  Diagnosis Date   Anxiety     TIA (transient ischemic attack)               Past Surgical History:  Procedure Laterality Date   HAND SURGERY Left     HEMORRHOID SURGERY       TONSILLECTOMY AND ADENOIDECTOMY                 Family History  Problem Relation Age of Onset   Prostate cancer Father          Social History:  reports that he has quit smoking. His smoking use included cigarettes. He has never used smokeless tobacco. He reports that he does not currently use alcohol. He reports that he does not use drugs. Allergies:  Allergies  No Known Allergies         Medications Prior to Admission  Medication Sig Dispense Refill   ALPRAZolam (XANAX) 1 MG tablet TAKE 2 TABLETS (2 MG TOTAL) BY MOUTH NIGHTLY AS NEEDED FOR SLEEP       amitriptyline (ELAVIL) 25 MG tablet         clopidogrel (PLAVIX) 75 MG tablet         finasteride (PROSCAR) 5 MG tablet Take 1 tablet (5 mg total) by mouth daily. 90 tablet 3   imipramine (TOFRANIL) 50 MG tablet Take 50 mg by mouth at bedtime.       tamsulosin (FLOMAX) 0.4 MG CAPS capsule TAKE 1 CAPSULE EVERY DAY 90 capsule 3   traZODone (DESYREL) 100 MG tablet Take 100 mg by mouth at bedtime.              Home: Home Living Family/patient expects to be discharged to:: Private residence Living Arrangements: Spouse/significant other Available Help at Discharge: Family, Available 24 hours/day Type of Home: House Home Access: Stairs to enter [employer] of Steps:  1 Entrance Stairs-Rails: None Home Layout: Able to live on main level with bedroom/bathroom Alternate Level Stairs-Number of Steps: no essential needs on upper level of home Bathroom Shower/Tub: Health visitor: Standard Bathroom Accessibility: Yes Home Equipment: Agricultural consultant (2 wheels), [employer] - single point, Information systems manager - built in, Information systems manager  Lives With: Spouse  Functional History: Prior Function Prior Level of Function : Independent/Modified Independent Mobility Comments: Mod indep with RW (longer distances) versus SPC (shorter-distances); denies recent fall history.  No driving.  Was actively participating with HHPT prior to admission ADLs Comments: Mod indep with ADLs and light meal prep; does have aide that comes regularly to assist patient/wife. Functional Status:  Mobility: Bed Mobility Overal bed mobility: Needs Assistance (sup via cueing to scoot to EOB to get feet on floor) Bed Mobility: Supine to Sit Supine to sit: Supervision General bed mobility comments: Pt needed SUP for supine to sit at EOB d/t needing to scoot forward to get BLEs on ground Transfers Overall transfer level: Needs assistance Equipment used: Rolling walker (2 wheels) Transfers:  Sit to/from Stand Sit to Stand: Contact guard assist Bed to/from chair/wheelchair/BSC transfer type:: Stand pivot Stand pivot transfers: Mod assist, +2 physical assistance General transfer comment: Pt performed STS from EOB to RW with CGA, verb cues to push up from EOB with 1 hand versus pulling on RW. He ambulated ~20 feet using RW with CGA/MIN A for safety and pacing to sink to perform oral hygiene tasks then returned to recliner. Ambulation/Gait General Gait Details: deferred this date   ADL: ADL Overall ADL's : Needs assistance/impaired Grooming: Wash/dry face, Cueing for safety, Supervision/safety, Cueing for sequencing Grooming Details (indicate cue type and reason): Items placed on pt's L side and  noted to compensate by turning his entire head to find items Upper Body Bathing: Set up Lower Body Dressing: Minimal assistance, Cueing for sequencing Lower Body Dressing Details (indicate cue type and reason): dons and doffs socks, impaired bilateral coordination Toilet Transfer: Minimal assistance Toilet Transfer Details (indicate cue type and reason): simulated, pt stands with forward lean, requiring cues to correct Functional mobility during ADLs: Cueing for sequencing, Cueing for safety General ADL Comments: Items placed on pt's L side and noted to compensate by turning his entire head to find items; most notable deficits are pt's FMC, L sided inattention and balance/safety deficitis notable with pt requiring Min A d/t L sided lateral lean in standing and downward gaze with forward flexed posture. Pt needed unilateral support on counter for balance as well.   Cognition: Cognition Overall Cognitive Status: Difficult to assess Orientation Level: Oriented X4 Cognition Arousal: Alert Behavior During Therapy: WFL for tasks assessed/performed Overall Cognitive Status: Difficult to assess General Comments: Alert and oriented to basic information; follows simple commands.  Marked delay in processing/task initiation at times.  Noted inattention to L hemi-body (esp with dual-task activities) Difficult to assess due to: Hard of hearing/deaf   Blood pressure 127/77, pulse 88, temperature 97.6 F (36.4 C), temperature source Oral, resp. rate 16, height 5\' 8"  (1.727 m), weight 72.6 kg, SpO2 99%. Physical Exam Gen: no distress, normal appearing HEENT: oral mucosa pink and moist, NCAT Cardio: Reg rate Chest: normal effort, normal rate of breathing Abd: soft, non-distended Ext: no edema Psych: pleasant, normal affect Skin: intact Neuro: left sided weakness with drift, sensation intact   Lab Results Last 24 Hours  No results found for this or any previous visit (from the past 24 hour(s)).     Imaging Results (Last 48 hours)  ECHOCARDIOGRAM COMPLETE BUBBLE STUDY   Result Date: 09/03/2023    ECHOCARDIOGRAM REPORT   Patient Name:   James [name]. Date of Exam: 09/03/2023 Medical Rec #:  [id number]              Height:       68.0 in Accession #:    [id number]             Weight:       160.0 lb Date of Birth:  10-14-1934              BSA:          1.859 m Patient Age:    89 years               BP:           107/58 mmHg Patient Gender: M                      HR:  49 bpm. Exam Location:  [facility] Procedure: 2D Echo, Cardiac Doppler, Color Doppler and Saline Contrast Bubble            Study Indications:     Stroke  History:         Patient has no prior history of Echocardiogram examinations.                  Stroke and PAD; Risk Factors:Hypertension and Dyslipidemia.  Sonographer:     Mikki Harbor Referring Phys:  [id number] JAN A MANSY Diagnosing Phys: Julien Nordmann MD  Sonographer Comments: Technically difficult study due to poor echo windows, suboptimal apical window and no subcostal window. Image acquisition challenging due to patient body habitus and Image acquisition challenging due to respiratory motion. IMPRESSIONS  1. Left ventricular ejection fraction, by estimation, is 55 to 60%. The left ventricle has normal function. The left ventricle has no regional wall motion abnormalities. Left ventricular diastolic parameters are consistent with Grade I diastolic dysfunction (impaired relaxation).  2. Right ventricular systolic function is normal. The right ventricular size is normal. There is normal pulmonary artery systolic pressure.  3. The mitral valve is normal in structure. No evidence of mitral valve regurgitation. No evidence of mitral stenosis.  4. The aortic valve has an indeterminant number of cusps. Aortic valve regurgitation is not visualized. Aortic valve sclerosis is present, with no evidence of aortic valve stenosis.  5. The inferior vena cava is normal in size with  greater than 50% respiratory variability, suggesting right atrial pressure of 3 mmHg.  6. Agitated saline contrast bubble study was negative, with no evidence of any interatrial shunt. FINDINGS  Left Ventricle: Left ventricular ejection fraction, by estimation, is 55 to 60%. The left ventricle has normal function. The left ventricle has no regional wall motion abnormalities. The left ventricular internal cavity size was normal in size. There is  no left ventricular hypertrophy. Left ventricular diastolic parameters are consistent with Grade I diastolic dysfunction (impaired relaxation). Right Ventricle: The right ventricular size is normal. No increase in right ventricular wall thickness. Right ventricular systolic function is normal. There is normal pulmonary artery systolic pressure. The tricuspid regurgitant velocity is 1.71 m/s, and  with an assumed right atrial pressure of 3 mmHg, the estimated right ventricular systolic pressure is 14.7 mmHg. Left Atrium: Left atrial size was normal in size. Right Atrium: Right atrial size was normal in size. Pericardium: There is no evidence of pericardial effusion. Mitral Valve: The mitral valve is normal in structure. No evidence of mitral valve regurgitation. No evidence of mitral valve stenosis. MV peak gradient, 2.5 mmHg. The mean mitral valve gradient is 1.0 mmHg. Tricuspid Valve: The tricuspid valve is normal in structure. Tricuspid valve regurgitation is not demonstrated. No evidence of tricuspid stenosis. Aortic Valve: The aortic valve has an indeterminant number of cusps. Aortic valve regurgitation is not visualized. Aortic valve sclerosis is present, with no evidence of aortic valve stenosis. Aortic valve mean gradient measures 2.0 mmHg. Aortic valve peak gradient measures 2.8 mmHg. Aortic valve area, by VTI measures 2.09 cm. Pulmonic Valve: The pulmonic valve was normal in structure. Pulmonic valve regurgitation is not visualized. No evidence of pulmonic  stenosis. Aorta: The aortic root is normal in size and structure. Venous: The inferior vena cava is normal in size with greater than 50% respiratory variability, suggesting right atrial pressure of 3 mmHg. IAS/Shunts: No atrial level shunt detected by color flow Doppler. Agitated saline contrast was given intravenously to evaluate for intracardiac  shunting. Agitated saline contrast bubble study was negative, with no evidence of any interatrial shunt. There  is no evidence of a patent foramen ovale. There is no evidence of an atrial septal defect.  LEFT VENTRICLE PLAX 2D LVIDd:         3.90 cm   Diastology LVIDs:         2.60 cm   LV e' medial:    7.18 cm/s LV PW:         1.00 cm   LV E/e' medial:  7.8 LV IVS:        1.10 cm   LV e' lateral:   9.90 cm/s LVOT diam:     1.90 cm   LV E/e' lateral: 5.7 LV SV:         49 LV SV Index:   26 LVOT Area:     2.84 cm  RIGHT VENTRICLE RV Basal diam:  2.90 cm RV Mid diam:    3.20 cm RV S prime:     13.40 cm/s TAPSE (M-mode): 2.2 cm LEFT ATRIUM           Index        RIGHT ATRIUM           Index LA diam:      3.30 cm 1.78 cm/m   RA Area:     15.60 cm LA Vol (A4C): 36.1 ml 19.42 ml/m  RA Volume:   36.50 ml  19.64 ml/m  AORTIC VALVE                    PULMONIC VALVE AV Area (Vmax):    2.18 cm     PV Vmax:       0.76 m/s AV Area (Vmean):   2.01 cm     PV Peak grad:  2.3 mmHg AV Area (VTI):     2.09 cm AV Vmax:           84.10 cm/s AV Vmean:          61.800 cm/s AV VTI:            0.233 m AV Peak Grad:      2.8 mmHg AV Mean Grad:      2.0 mmHg LVOT Vmax:         64.70 cm/s LVOT Vmean:        43.800 cm/s LVOT VTI:          0.172 m LVOT/AV VTI ratio: 0.74  AORTA Ao Root diam: 3.40 cm Ao Asc diam:  3.00 cm MITRAL VALVE               TRICUSPID VALVE MV Area (PHT): 3.56 cm    TR Peak grad:   11.7 mmHg MV Area VTI:   1.77 cm    TR Vmax:        171.00 cm/s MV Peak grad:  2.5 mmHg MV Mean grad:  1.0 mmHg    SHUNTS MV Vmax:       0.78 m/s    Systemic VTI:  0.17 m MV Vmean:      37.0  cm/s   Systemic Diam: 1.90 cm MV Decel Time: 213 msec MV E velocity: 56.10 cm/s MV A velocity: 58.70 cm/s MV E/A ratio:  0.96 Julien Nordmann MD Electronically signed by Julien Nordmann MD Signature Date/Time: 09/03/2023/6:17:41 PM    Final     CT ANGIO HEAD NECK W WO CM   Result Date: 09/02/2023 CLINICAL DATA:  Stroke/TIA, determine embolic source EXAM: CT ANGIOGRAPHY HEAD AND NECK WITH AND WITHOUT CONTRAST TECHNIQUE: Multidetector CT imaging of the head and neck was performed using the standard protocol during bolus administration of intravenous contrast. Multiplanar CT image reconstructions and MIPs were obtained to evaluate the vascular anatomy. Carotid stenosis measurements (when applicable) are obtained utilizing NASCET criteria, using the distal internal carotid diameter as the denominator. RADIATION DOSE REDUCTION: This exam was performed according to the departmental dose-optimization program which includes automated exposure control, adjustment of the mA and/or kV according to patient size and/or use of iterative reconstruction technique. CONTRAST:  75mL OMNIPAQUE IOHEXOL 350 MG/ML SOLN COMPARISON:  Same day CT head and MRI head. FINDINGS: CTA NECK FINDINGS Aortic arch: Great vessel origins are patent. Right carotid system: Atherosclerosis at the carotid bifurcation without greater than 50% stenosis. Left carotid system: Atherosclerosis at the carotid bifurcation without greater than 50% stenosis. Vertebral arteries: Right dominant. Rim both vertebral arteries are patent without significant (greater than 50%) stenosis in the neck. Skeleton: No acute abnormality on limited assessment. Other neck: No acute abnormality on limited assessment. 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 Review of the MIP images confirms the above findings CTA HEAD FINDINGS Anterior circulation: Bilateral intracranial ICAs, Posterior circulation: Moderate stenosis of the non  dominant left intradural vertebral artery at its dural margin. The dominant right vertebral artery and basilar artery are patent without significant stenosis. Occlusion versus critical stenosis of the right P2 PCA with poor/irregular distal opacification. Severe distal left P2 PCA stenosis. Venous sinuses: As permitted by contrast timing, patent. Review of the MIP images confirms the above findings IMPRESSION: 1. Occlusion versus critical stenosis of the right P2 PCA with poor/irregular distal opacification. 2. Severe distal left P2 PCA stenosis. 3. Moderate stenosis of the non dominant left intradural vertebral artery at its dural margin. 4. 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 A follow-up CTA of the chest is recommended to fully characterize and to determine follow-up recommendations. Findings discussed with Dr. Lenard Lance via telephone at 9:24 p.m. Electronically Signed   By: Feliberto Harts M.D.   On: 09/02/2023 21:26    MR BRAIN WO CONTRAST   Result Date: 09/02/2023 CLINICAL DATA:  Neuro deficit, acute, stroke suspected acute right EXAM: MRI HEAD WITHOUT CONTRAST TECHNIQUE: Multiplanar, multiecho pulse sequences of the brain and surrounding structures were obtained without intravenous contrast. COMPARISON:  Same day CT head. FINDINGS: Brain: Acute right thalamocapsular infarct. Mild edema without mass effect. Two punctate acute infarcts in the right occipital lobe and possible small subacute infarct in the left frontal white matter. Additional scattered T2/FLAIR hyperintensities in the white matter, compatible with chronic microvascular ischemic change. No evidence acute hemorrhage, mass lesion, midline shift or hydrocephalus. Cerebral atrophy. Vascular: Major arterial flow voids are maintained skull base. Skull and upper cervical spine: Normal marrow signal. Sinuses/Orbits: Clear sinuses.  No acute orbital findings. Other: No mastoid effusions. IMPRESSION: 1.  Acute right thalamocapsular infarct. 2. Two punctate acute infarcts in the right occipital lobe and possible small subacute infarct in the left frontal white matter. 3. Chronic microvascular ischemic disease. Electronically Signed   By: Feliberto Harts M.D.   On: 09/02/2023 18:52       Assessment/Plan: Diagnosis: R thalamaopsular, occipital and L frontal infarcts.  Does the need for close, 24 hr/day medical supervision in concert with the patient's rehab needs make it unreasonable for this patient  to be served in a less intensive setting? Yes Co-Morbidities requiring supervision/potential complications:  1) Anxiety: continue trazodone 2) BPH: continue flomax 3) PAD 4) Descending aortic arch aneurysm 5) GERD: provide dietary education Due to bladder management, bowel management, safety, skin/wound care, disease management, medication administration, pain management, and patient education, does the patient require 24 hr/day rehab nursing? Yes Does the patient require coordinated care of a physician, rehab nurse, therapy disciplines of PT, OT to address physical and functional deficits in the context of the above medical diagnosis(es)? Yes Addressing deficits in the following areas: balance, endurance, locomotion, strength, transferring, bowel/bladder control, bathing, dressing, feeding, grooming, toileting, and language Can the patient actively participate in an intensive therapy program of at least 3 hrs of therapy per day at least 5 days per week? Yes The potential for patient to make measurable gains while on inpatient rehab is excellent Anticipated functional outcomes upon discharge from inpatient rehab are modified independent  with PT, modified independent with OT, modified independent with SLP. Estimated rehab length of stay to reach the above functional goals is: 10-14 days Anticipated discharge destination: Home Overall Rehab/Functional Prognosis: excellent   POST ACUTE  RECOMMENDATIONS: This patient's condition is appropriate for continued rehabilitative care in the following setting: CIR Patient has agreed to participate in recommended program. Yes Note that insurance prior authorization may be required for reimbursement for recommended care.     I have personally performed a face to face diagnostic evaluation of this patient. Additionally, I have examined the patient's medical record including any pertinent labs and radiographic images. If the physician assistant has documented in this note, I have reviewed and edited or otherwise concur with the physician assistant's documentation.   Thanks,   Horton Chin, MD 09/04/2023

## 2023-09-08 NOTE — Discharge Instructions (Addendum)
Inpatient Rehab Discharge Instructions  [place]. Discharge date and time: No discharge date for patient encounter.   Activities/Precautions/ Functional Status: Activity: activity as tolerated Diet: regular diet Wound Care: Routine skin checks Functional status:  ___ No restrictions     ___ Walk up steps independently ___ 24/7 supervision/assistance   ___ Walk up steps with assistance ___ Intermittent supervision/assistance  ___ Bathe/dress independently ___ Walk with walker     _x__ Bathe/dress with assistance ___ Walk Independently    ___ Shower independently ___ Walk with assistance    ___ Shower with assistance ___ No alcohol     ___ Return to work/school ________  Special Instructions:  No driving smoking or alcohol  Continue aspirin 81 mg daily and Plavix 75 mg daily x 3 months then aspirin alone  COMMUNITY REFERRALS UPON DISCHARGE:    Home Health:   PT    OT    SP                Agency: [facility]   Phone: [phone]   Medical Equipment/Items Ordered: HAS ALL NEEDED EQUIPMENT                                                 Agency/Supplier:NA    STROKE/TIA DISCHARGE INSTRUCTIONS SMOKING Cigarette smoking nearly doubles your risk of having a stroke & is the single most alterable risk factor  If you smoke or have smoked in the last 12 months, you are advised to quit smoking for your health. Most of the excess cardiovascular risk related to smoking disappears within a year of stopping. Ask you doctor about anti-smoking medications [place] Quit Line: 1-800-QUIT NOW Free Smoking Cessation Classes [phone]  CHOLESTEROL Know your levels; limit fat & cholesterol in your diet  Lipid Panel     Component Value Date/Time   CHOL 199 09/03/2023 0428   TRIG 61 09/03/2023 0428   HDL 52 09/03/2023 0428   CHOLHDL 3.8 09/03/2023 0428   VLDL 12 09/03/2023 0428   LDLCALC 135 (H) 09/03/2023 0428     Many patients benefit from treatment even if their  cholesterol is at goal. Goal: Total Cholesterol (CHOL) less than 160 Goal:  Triglycerides (TRIG) less than 150 Goal:  HDL greater than 40 Goal:  LDL (LDLCALC) less than 100   BLOOD PRESSURE American Stroke Association blood pressure target is less that 120/80 mm/Hg  Your discharge blood pressure is:    Monitor your blood pressure Limit your salt and alcohol intake Many individuals will require more than one medication for high blood pressure  DIABETES (A1c is a blood sugar average for last 3 months) Goal HGBA1c is under 7% (HBGA1c is blood sugar average for last 3 months)  Diabetes: No known diagnosis of diabetes    Lab Results  Component Value Date   HGBA1C 5.5 09/02/2023    Your HGBA1c can be lowered with medications, healthy diet, and exercise. Check your blood sugar as directed by your physician Call your physician if you experience unexplained or low blood sugars.  PHYSICAL ACTIVITY/REHABILITATION Goal is 30 minutes at least 4 days per week  Activity: Increase activity slowly, Therapies: Physical Therapy: Home Health Return to work:  Activity decreases your risk of heart attack and stroke and makes your heart stronger.  It helps control your weight and blood pressure; helps  you relax and can improve your mood. Participate in a regular exercise program. Talk with your doctor about the best form of exercise for you (dancing, walking, swimming, cycling).  DIET/WEIGHT Goal is to maintain a healthy weight  Your discharge diet is:  Diet Order     None       liquids Your height is:    Your current weight is:   Your Body Mass Index (BMI) is:    Following the type of diet specifically designed for you will help prevent another stroke. Your goal weight range is:   Your goal Body Mass Index (BMI) is 19-24. Healthy food habits can help reduce 3 risk factors for stroke:  High cholesterol, hypertension, and excess weight.  RESOURCES Stroke/Support Group:  Call [phone]   STROKE  EDUCATION PROVIDED/REVIEWED AND GIVEN TO PATIENT Stroke warning signs and symptoms How to activate emergency medical system (call 911). Medications prescribed at discharge. Need for follow-up after discharge. Personal risk factors for stroke. Pneumonia vaccine given: No Flu vaccine given: No My questions have been answered, the writing is legible, and I understand these instructions.  I will adhere to these goals & educational materials that have been provided to me after my discharge from the hospital.     My questions have been answered and I understand these instructions. I will adhere to these goals and the provided educational materials after my discharge from the hospital.  Patient/Caregiver Signature _______________________________ Date __________  Clinician Signature _______________________________________ Date __________  Please bring this form and your medication list with you to all your follow-up doctor's appointments.

## 2023-09-08 NOTE — H&P (Signed)
Physical Medicine and Rehabilitation Admission H&P    Chief Complaint  Patient presents with   Weakness   Fall  : HPI: James [name] is an 89 year old right-handed male with history significant for anxiety, TIA/lacunar infarct, polyneuropathy followed by [facility]. [facility] BPH, mild cognitive impairment and tobacco use.  Per chart review patient lives with spouse.  Two-level home with bed and bath main level one-step to entry.  Modified independent with rolling walker/straight point cane.  No driving.  Was actively participating with home health therapies prior to admission.  He does have a home health aide that comes in regularly to assist with ADLs.  Presented to [facility] 09/02/2023 with persistent  left-sided weakness as well as a fall landing on his left arm..  No loss of consciousness.  CT/MRI showed acute right thalamocapsular infarction.  2 punctate acute infarcts in the right occipital lobe and possible small subacute infarct in the left frontal white matter.  CT angiogram head and neck occlusion versus critical stenosis of the right P2 PCA with poor/irregular distal opacification.  Severe distal left P2 PCA stenosis.  Moderate stenosis of the nondominant left intradural vertebral artery at its dural margin.  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  Admission chemistries unremarkable except BUN 24 creatinine 1.31, urinalysis negative nitrite.  Echocardiogram ejection fraction of 55 to 60% no wall motion abnormalities grade 1 diastolic dysfunction.  Neurology follow-up maintained on low-dose aspirin 81 mg daily and Plavix 75 mg daily for CVA prophylaxis x 3 months then aspirin alone.  Lovenox for DVT prophylaxis.  Tolerating a regular consistency diet.  Therapy evaluations completed due to patient decreased functional ability and left-sided weakness was admitted for a comprehensive rehab program.   Pt reports having urinary  incontinence- wakes up especially in AM and is wet. Drinks a lot of water LBM this AM L foot really numb since stroke.  Denies pain.  Really tired since stroke, but worse in last 24 hours- Head CT Negative for changes.   Wife is at [facility] being treated for bladder CA.   Review of Systems  Constitutional:  Negative for chills and fever.  HENT:  Negative for hearing loss.   Eyes:  Negative for blurred vision and double vision.  Respiratory:  Negative for cough, shortness of breath and wheezing.   Cardiovascular:  Negative for chest pain, palpitations and leg swelling.  Gastrointestinal:  Positive for constipation. Negative for heartburn, nausea and vomiting.  Genitourinary:  Positive for frequency and urgency. Negative for dysuria, flank pain and hematuria.  Musculoskeletal:  Positive for falls, joint pain and myalgias.  Skin:  Negative for rash.  Neurological:  Positive for weakness.  Psychiatric/Behavioral:  Positive for memory loss.        Anxiety  All other systems reviewed and are negative.  Past Medical History:  Diagnosis Date   Anxiety    TIA (transient ischemic attack)    Past Surgical History:  Procedure Laterality Date   HAND SURGERY Left    HEMORRHOID SURGERY     TONSILLECTOMY AND ADENOIDECTOMY     Family History  Problem Relation Age of Onset   Prostate cancer Father    Social History:  reports that he has quit smoking. His smoking use included cigarettes. He has never used smokeless tobacco. He reports that he does not currently use alcohol. He reports that he does not use drugs. Allergies: No Known Allergies Medications  Prior to Admission  Medication Sig Dispense Refill   ALPRAZolam (XANAX) 1 MG tablet TAKE 2 TABLETS (2 MG TOTAL) BY MOUTH NIGHTLY AS NEEDED FOR SLEEP     amitriptyline (ELAVIL) 25 MG tablet      clopidogrel (PLAVIX) 75 MG tablet      finasteride (PROSCAR) 5 MG tablet Take 1 tablet (5 mg total) by mouth daily. 90 tablet 3   imipramine  (TOFRANIL) 50 MG tablet Take 50 mg by mouth at bedtime.     tamsulosin (FLOMAX) 0.4 MG CAPS capsule TAKE 1 CAPSULE EVERY DAY 90 capsule 3   traZODone (DESYREL) 100 MG tablet Take 100 mg by mouth at bedtime.        Home: Home Living Family/patient expects to be discharged to:: Private residence Living Arrangements: Spouse/significant other Available Help at Discharge: Family, Available 24 hours/day (per report) Type of Home: House Home Access: Stairs to enter [employer] of Steps: 1 Entrance Stairs-Rails: None Home Layout: Able to live on main level with bedroom/bathroom Alternate Level Stairs-Number of Steps: no essential needs on upper level of home Bathroom Shower/Tub: Health visitor: Standard Bathroom Accessibility: Yes Home Equipment: Agricultural consultant (2 wheels), [employer] - single point, Information systems manager - built in, Information systems manager  Lives With: Spouse   Functional History: Prior Function Prior Level of Function : Independent/Modified Independent Mobility Comments: Mod indep with RW (longer distances) versus SPC (shorter-distances); denies recent fall history.  No driving.  Was actively participating with HHPT prior to admission ADLs Comments: Mod indep with ADLs and light meal prep; does have aide that comes regularly to assist patient/wife.  Functional Status:  Mobility: Bed Mobility Overal bed mobility: Needs Assistance Bed Mobility: Supine to Sit Supine to sit: Supervision Sit to supine: Supervision General bed mobility comments: pt getting up OOB with alarm going off, requires cuing to return to supine centered in bed as pt initally lies down diagnolly Transfers Overall transfer level: Needs assistance Equipment used: Rolling walker (2 wheels), None, Straight cane Transfers: Sit to/from Stand Sit to Stand: Min assist Bed to/from chair/wheelchair/BSC transfer type:: Step pivot Stand pivot transfers: Mod assist, +2 physical assistance Step pivot  transfers: Min assist General transfer comment: manual cuing for postural extension/orientation to midline, multimodal cuing for technique of RW Ambulation/Gait Ambulation/Gait assistance: Min assist, Mod assist Gait Distance (Feet): 300 Feet Assistive device: 1 person hand held assist, Straight cane, Rolling walker (2 wheels) Gait Pattern/deviations: Step-through pattern, Shuffle, Staggering left, Staggering right, Drifts right/left, Narrow base of support, Decreased step length - right, Decreased step length - left, Steppage General Gait Details: very varried step pattern during session, gait with RW and with SPC then needing hand held assist Gait velocity: decreased    ADL: ADL Overall ADL's : Needs assistance/impaired Grooming: Wash/dry hands, Standing, Minimal assistance Grooming Details (indicate cue type and reason): Items placed on pt's L side and noted to compensate by turning his entire head to find items Upper Body Bathing: Set up Lower Body Dressing: Minimal assistance, Cueing for sequencing Lower Body Dressing Details (indicate cue type and reason): fixes socks seated on commode Toilet Transfer: Minimal assistance Toilet Transfer Details (indicate cue type and reason): using RW, pt forward lean requiring cues to correct Toileting- Clothing Manipulation and Hygiene: Contact guard assist, Cueing for sequencing, Cueing for safety, Sit to/from stand Toileting - Clothing Manipulation Details (indicate cue type and reason): needs cues to pull pants completely off hips Functional mobility during ADLs: Cueing for sequencing, Cueing for safety, Rolling  walker (2 wheels) General ADL Comments: Pt talkative, cues to redirect, impaired STM, poor awareness of deficits and environmental safety  Cognition: Cognition Overall Cognitive Status: Impaired/Different from baseline Arousal/Alertness: Awake/alert Orientation Level: Oriented X4 Year: 2024 Month: September Day of Week:  Correct Attention: Focused, Sustained Focused Attention: Appears intact Sustained Attention: Appears intact Memory: Impaired Memory Impairment: Decreased recall of new information, Decreased short term memory Decreased Short Term Memory: Verbal complex Awareness: Appears intact (grossly) Problem Solving: Impaired Problem Solving Impairment: Verbal complex (anticipatory) Executive Function: Reasoning, Sequencing, Organizing Reasoning: Appears intact Sequencing: Impaired Sequencing Impairment: Verbal complex Organizing: Impaired Organizing Impairment: Verbal complex, Functional complex Behaviors: Impulsive (w/ getting up to go to the bathroom) Safety/Judgment: Impaired Comments: min impulsive w/ decreased awareness of self, safety Cognition Arousal: Alert Behavior During Therapy: Impulsive Overall Cognitive Status: Impaired/Different from baseline Area of Impairment: Safety/judgement, Problem solving, Following commands, Awareness, Memory Memory: Decreased short-term memory Following Commands: Follows one step commands inconsistently, Follows one step commands with increased time Safety/Judgement: Decreased awareness of safety, Decreased awareness of deficits Problem Solving: Slow processing, Difficulty sequencing General Comments: very talkative Difficult to assess due to: Hard of hearing/deaf  Physical Exam: Blood pressure 115/77, pulse 84, temperature 98.2 F (36.8 C), temperature source Oral, resp. rate 18, height 5\' 8"  (1.727 m), weight 72.6 kg, SpO2 96%. Physical Exam Vitals and nursing note reviewed. Exam conducted with a chaperone present.  Constitutional:      Appearance: Normal appearance.     Comments: Awake, alert, HOH; sitting up in bed; appears stated age; NAD  HENT:     Head: Normocephalic and atraumatic.     Comments: Facial asymmetry Tongue midline Facial sensation intact     Right Ear: External ear normal.     Left Ear: External ear normal.     Nose:  Nose normal. No congestion.     Mouth/Throat:     Mouth: Mucous membranes are dry.     Pharynx: Oropharynx is clear. No oropharyngeal exudate.  Eyes:     General:        Right eye: No discharge.        Left eye: No discharge.     Extraocular Movements: Extraocular movements intact.  Cardiovascular:     Rate and Rhythm: Normal rate and regular rhythm.     Heart sounds: Normal heart sounds. No murmur heard.    No gallop.  Pulmonary:     Effort: Pulmonary effort is normal. No respiratory distress.     Breath sounds: Normal breath sounds. No wheezing, rhonchi or rales.  Abdominal:     General: Bowel sounds are normal. There is no distension.     Palpations: Abdomen is soft.     Tenderness: There is no abdominal tenderness.  Musculoskeletal:     Cervical back: Neck supple.     Comments: RUE- 5/5 throughout LUE- 4+/5 but cannot make fist- of note, fingers on L hand smaller than R hand- smaller circumference RLE- 5/5 throughout LLE- HF 4/5; KE 4+/5; DF/PF 5-/5  Skin:    General: Skin is warm and dry.     Comments: Reddened in area of L AC fossa from IV infiltrated   Neurological:     Mental Status: He is alert.     Comments: Patient is alert.  He can provide his name and month as well as hospital.  Speech is a dysarthric but intelligible.  Needed some cues for medical history HOH Decreased to light touch on LUE/LLE- almost absent on LLE  Psychiatric:        Mood and Affect: Mood normal.        Behavior: Behavior normal.     No results found for this or any previous visit (from the past 48 hour(s)). No results found.    Blood pressure 115/77, pulse 84, temperature 98.2 F (36.8 C), temperature source Oral, resp. rate 18, height 5\' 8"  (1.727 m), weight 72.6 kg, SpO2 96%.  Medical Problem List and Plan: 1. Functional deficits secondary to right thalamic capsular infarct and 2 punctate infarcts in the right occipital lobe with a possible small subacute infarct in the left  frontal white matter with chronic microvascular ischemic changes  -patient may  shower  -ELOS/Goals: 10-14 days 2.  Antithrombotics: -DVT/anticoagulation:  Pharmaceutical: Lovenox  -antiplatelet therapy: Aspirin 81 mg daily and Plavix 75 mg daily x 3 months then aspirin alone 3. Pain Management: Tylenol as needed 4. Mood/Behavior/Sleep: Trazodone 100 mg nightly, Xanax 1 mg twice daily as needed  -antipsychotic agents: N/A 5. Neuropsych/cognition: This patient is capable of making decisions on his own behalf. 6. Skin/Wound Care: Routine skin checks 7. Fluids/Electrolytes/Nutrition: Routine in and outs with follow-up chemistries 8.  BPH.  Proscar 5 mg daily/Flomax 0.4 mg daily 9.  Hyperlipidemia.  Lipitor 10.  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  Follow-up outpatient CT chest CTA 11. IV infiltration on L AC fossa- warm compresses   Charlton Amor, PA-C 09/08/2023  I have personally performed a face to face diagnostic evaluation of this patient and formulated the key components of the plan.  Additionally, I have personally reviewed laboratory data, imaging studies, as well as relevant notes and concur with the physician assistant's documentation above.   The patient's status has not changed from the original H&P.  Any changes in documentation from the acute care chart have been noted above.

## 2023-09-08 NOTE — Progress Notes (Signed)
Inpatient Rehab Admissions Coordinator:  There is a bed available for pt in CIR today. Dr. Nelson Chimes aware and in agreement. Pt, pt's daughter Hermelinda Dellen, NSG and TOC made aware. Transport has been arranged with [facility].  Wolfgang Phoenix, MS, CCC-SLP Admissions Coordinator [phone]

## 2023-09-08 NOTE — H&P (Signed)
Physical Medicine and Rehabilitation Admission H&P        Chief Complaint  Patient presents with   Weakness   Fall  : HPI: James [name] is an 89 year old right-handed male with history significant for anxiety, TIA/lacunar infarct, polyneuropathy followed by [facility]. [facility] BPH, mild cognitive impairment and tobacco use.  Per chart review patient lives with spouse.  Two-level home with bed and bath main level one-step to entry.  Modified independent with rolling walker/straight point cane.  No driving.  Was actively participating with home health therapies prior to admission.  He does have a home health aide that comes in regularly to assist with ADLs.  Presented to [facility] 09/02/2023 with persistent  left-sided weakness as well as a fall landing on his left arm..  No loss of consciousness.  CT/MRI showed acute right thalamocapsular infarction.  2 punctate acute infarcts in the right occipital lobe and possible small subacute infarct in the left frontal white matter.  CT angiogram head and neck occlusion versus critical stenosis of the right P2 PCA with poor/irregular distal opacification.  Severe distal left P2 PCA stenosis.  Moderate stenosis of the nondominant left intradural vertebral artery at its dural margin.  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  Admission chemistries unremarkable except BUN 24 creatinine 1.31, urinalysis negative nitrite.  Echocardiogram ejection fraction of 55 to 60% no wall motion abnormalities grade 1 diastolic dysfunction.  Neurology follow-up maintained on low-dose aspirin 81 mg daily and Plavix 75 mg daily for CVA prophylaxis x 3 months then aspirin alone.  Lovenox for DVT prophylaxis.  Tolerating a regular consistency diet.  Therapy evaluations completed due to patient decreased functional ability and left-sided weakness was admitted for a comprehensive rehab program.     Pt reports having  urinary incontinence- wakes up especially in AM and is wet. Drinks a lot of water LBM this AM L foot really numb since stroke.  Denies pain.  Really tired since stroke, but worse in last 24 hours- Head CT Negative for changes.    Wife is at [facility] being treated for bladder CA.    Review of Systems  Constitutional:  Negative for chills and fever.  HENT:  Negative for hearing loss.   Eyes:  Negative for blurred vision and double vision.  Respiratory:  Negative for cough, shortness of breath and wheezing.   Cardiovascular:  Negative for chest pain, palpitations and leg swelling.  Gastrointestinal:  Positive for constipation. Negative for heartburn, nausea and vomiting.  Genitourinary:  Positive for frequency and urgency. Negative for dysuria, flank pain and hematuria.  Musculoskeletal:  Positive for falls, joint pain and myalgias.  Skin:  Negative for rash.  Neurological:  Positive for weakness.  Psychiatric/Behavioral:  Positive for memory loss.        Anxiety  All other systems reviewed and are negative.       Past Medical History:  Diagnosis Date   Anxiety     TIA (transient ischemic attack)               Past Surgical History:  Procedure Laterality Date   HAND SURGERY Left     HEMORRHOID SURGERY       TONSILLECTOMY AND ADENOIDECTOMY                 Family History  Problem Relation Age of Onset   Prostate cancer Father  Social History:  reports that he has quit smoking. His smoking use included cigarettes. He has never used smokeless tobacco. He reports that he does not currently use alcohol. He reports that he does not use drugs. Allergies:  Allergies  No Known Allergies         Medications Prior to Admission  Medication Sig Dispense Refill   ALPRAZolam (XANAX) 1 MG tablet TAKE 2 TABLETS (2 MG TOTAL) BY MOUTH NIGHTLY AS NEEDED FOR SLEEP       amitriptyline (ELAVIL) 25 MG tablet         clopidogrel (PLAVIX) 75 MG tablet         finasteride  (PROSCAR) 5 MG tablet Take 1 tablet (5 mg total) by mouth daily. 90 tablet 3   imipramine (TOFRANIL) 50 MG tablet Take 50 mg by mouth at bedtime.       tamsulosin (FLOMAX) 0.4 MG CAPS capsule TAKE 1 CAPSULE EVERY DAY 90 capsule 3   traZODone (DESYREL) 100 MG tablet Take 100 mg by mouth at bedtime.                  Home: Home Living Family/patient expects to be discharged to:: Private residence Living Arrangements: Spouse/significant other Available Help at Discharge: Family, Available 24 hours/day (per report) Type of Home: House Home Access: Stairs to enter [employer] of Steps: 1 Entrance Stairs-Rails: None Home Layout: Able to live on main level with bedroom/bathroom Alternate Level Stairs-Number of Steps: no essential needs on upper level of home Bathroom Shower/Tub: Health visitor: Standard Bathroom Accessibility: Yes Home Equipment: Agricultural consultant (2 wheels), [employer] - single point, Information systems manager - built in, Information systems manager  Lives With: Spouse   Functional History: Prior Function Prior Level of Function : Independent/Modified Independent Mobility Comments: Mod indep with RW (longer distances) versus SPC (shorter-distances); denies recent fall history.  No driving.  Was actively participating with HHPT prior to admission ADLs Comments: Mod indep with ADLs and light meal prep; does have aide that comes regularly to assist patient/wife.   Functional Status:  Mobility: Bed Mobility Overal bed mobility: Needs Assistance Bed Mobility: Supine to Sit Supine to sit: Supervision Sit to supine: Supervision General bed mobility comments: pt getting up OOB with alarm going off, requires cuing to return to supine centered in bed as pt initally lies down diagnolly Transfers Overall transfer level: Needs assistance Equipment used: Rolling walker (2 wheels), None, Straight cane Transfers: Sit to/from Stand Sit to Stand: Min assist Bed to/from chair/wheelchair/BSC  transfer type:: Step pivot Stand pivot transfers: Mod assist, +2 physical assistance Step pivot transfers: Min assist General transfer comment: manual cuing for postural extension/orientation to midline, multimodal cuing for technique of RW Ambulation/Gait Ambulation/Gait assistance: Min assist, Mod assist Gait Distance (Feet): 300 Feet Assistive device: 1 person hand held assist, Straight cane, Rolling walker (2 wheels) Gait Pattern/deviations: Step-through pattern, Shuffle, Staggering left, Staggering right, Drifts right/left, Narrow base of support, Decreased step length - right, Decreased step length - left, Steppage General Gait Details: very varried step pattern during session, gait with RW and with SPC then needing hand held assist Gait velocity: decreased   ADL: ADL Overall ADL's : Needs assistance/impaired Grooming: Wash/dry hands, Standing, Minimal assistance Grooming Details (indicate cue type and reason): Items placed on pt's L side and noted to compensate by turning his entire head to find items Upper Body Bathing: Set up Lower Body Dressing: Minimal assistance, Cueing for sequencing Lower Body Dressing Details (indicate cue type and  reason): fixes socks seated on commode Toilet Transfer: Minimal assistance Toilet Transfer Details (indicate cue type and reason): using RW, pt forward lean requiring cues to correct Toileting- Clothing Manipulation and Hygiene: Contact guard assist, Cueing for sequencing, Cueing for safety, Sit to/from stand Toileting - Clothing Manipulation Details (indicate cue type and reason): needs cues to pull pants completely off hips Functional mobility during ADLs: Cueing for sequencing, Cueing for safety, Rolling walker (2 wheels) General ADL Comments: Pt talkative, cues to redirect, impaired STM, poor awareness of deficits and environmental safety   Cognition: Cognition Overall Cognitive Status: Impaired/Different from baseline Arousal/Alertness:  Awake/alert Orientation Level: Oriented X4 Year: 2024 Month: September Day of Week: Correct Attention: Focused, Sustained Focused Attention: Appears intact Sustained Attention: Appears intact Memory: Impaired Memory Impairment: Decreased recall of new information, Decreased short term memory Decreased Short Term Memory: Verbal complex Awareness: Appears intact (grossly) Problem Solving: Impaired Problem Solving Impairment: Verbal complex (anticipatory) Executive Function: Reasoning, Sequencing, Organizing Reasoning: Appears intact Sequencing: Impaired Sequencing Impairment: Verbal complex Organizing: Impaired Organizing Impairment: Verbal complex, Functional complex Behaviors: Impulsive (w/ getting up to go to the bathroom) Safety/Judgment: Impaired Comments: min impulsive w/ decreased awareness of self, safety Cognition Arousal: Alert Behavior During Therapy: Impulsive Overall Cognitive Status: Impaired/Different from baseline Area of Impairment: Safety/judgement, Problem solving, Following commands, Awareness, Memory Memory: Decreased short-term memory Following Commands: Follows one step commands inconsistently, Follows one step commands with increased time Safety/Judgement: Decreased awareness of safety, Decreased awareness of deficits Problem Solving: Slow processing, Difficulty sequencing General Comments: very talkative Difficult to assess due to: Hard of hearing/deaf   Physical Exam: Blood pressure 115/77, pulse 84, temperature 98.2 F (36.8 C), temperature source Oral, resp. rate 18, height 5\' 8"  (1.727 m), weight 72.6 kg, SpO2 96%. Physical Exam Vitals and nursing note reviewed. Exam conducted with a chaperone present.  Constitutional:      Appearance: Normal appearance.     Comments: Awake, alert, HOH; sitting up in bed; appears stated age; NAD  HENT:     Head: Normocephalic and atraumatic.     Comments: Facial asymmetry Tongue midline Facial sensation  intact      Right Ear: External ear normal.     Left Ear: External ear normal.     Nose: Nose normal. No congestion.     Mouth/Throat:     Mouth: Mucous membranes are dry.     Pharynx: Oropharynx is clear. No oropharyngeal exudate.  Eyes:     General:        Right eye: No discharge.        Left eye: No discharge.     Extraocular Movements: Extraocular movements intact.  Cardiovascular:     Rate and Rhythm: Normal rate and regular rhythm.     Heart sounds: Normal heart sounds. No murmur heard.    No gallop.  Pulmonary:     Effort: Pulmonary effort is normal. No respiratory distress.     Breath sounds: Normal breath sounds. No wheezing, rhonchi or rales.  Abdominal:     General: Bowel sounds are normal. There is no distension.     Palpations: Abdomen is soft.     Tenderness: There is no abdominal tenderness.  Musculoskeletal:     Cervical back: Neck supple.     Comments: RUE- 5/5 throughout LUE- 4+/5 but cannot make fist- of note, fingers on L hand smaller than R hand- smaller circumference RLE- 5/5 throughout LLE- HF 4/5; KE 4+/5; DF/PF 5-/5  Skin:    General: Skin  is warm and dry.     Comments: Reddened in area of L AC fossa from IV infiltrated   Neurological:     Mental Status: He is alert.     Comments: Patient is alert.  He can provide his name and month as well as hospital.  Speech is a dysarthric but intelligible.  Needed some cues for medical history HOH Decreased to light touch on LUE/LLE- almost absent on LLE  Psychiatric:        Mood and Affect: Mood normal.        Behavior: Behavior normal.        Lab Results Last 48 Hours  No results found for this or any previous visit (from the past 48 hour(s)).   Imaging Results (Last 48 hours)  No results found.         Blood pressure 115/77, pulse 84, temperature 98.2 F (36.8 C), temperature source Oral, resp. rate 18, height 5\' 8"  (1.727 m), weight 72.6 kg, SpO2 96%.   Medical Problem List and Plan: 1.  Functional deficits secondary to right thalamic capsular infarct and 2 punctate infarcts in the right occipital lobe with a possible small subacute infarct in the left frontal white matter with chronic microvascular ischemic changes             -patient may  shower             -ELOS/Goals: 10-14 days 2.  Antithrombotics: -DVT/anticoagulation:  Pharmaceutical: Lovenox             -antiplatelet therapy: Aspirin 81 mg daily and Plavix 75 mg daily x 3 months then aspirin alone 3. Pain Management: Tylenol as needed 4. Mood/Behavior/Sleep: Trazodone 100 mg nightly, Xanax 1 mg twice daily as needed             -antipsychotic agents: N/A 5. Neuropsych/cognition: This patient is capable of making decisions on his own behalf. 6. Skin/Wound Care: Routine skin checks 7. Fluids/Electrolytes/Nutrition: Routine in and outs with follow-up chemistries 8.  BPH.  Proscar 5 mg daily/Flomax 0.4 mg daily 9.  Hyperlipidemia.  Lipitor 10.  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  Follow-up outpatient CT chest CTA 11. 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  Additionally, I have personally reviewed laboratory data, imaging studies, as well as relevant notes and concur with the physician assistant's documentation above.   The patient's status has not changed from the original H&P.  Any changes in documentation from the acute care chart have been noted above.

## 2023-09-08 NOTE — Evaluation (Incomplete)
Occupational Therapy Assessment and Plan  Patient Details  Name: James [name]. MRN: [id number] Date of Birth: [birth year]  OT Diagnosis: {diagnoses:3041644} Rehab Potential:   ELOS:     {CHL IP REHAB OT TIME CALCULATIONS:304400400}    Hospital Problem: Principal Problem:   Right thalamic infarction [facility])   Past Medical History:  Past Medical History:  Diagnosis Date   Anxiety    TIA (transient ischemic attack)    Past Surgical History:  Past Surgical History:  Procedure Laterality Date   HAND SURGERY Left    HEMORRHOID SURGERY     TONSILLECTOMY AND ADENOIDECTOMY      Assessment & Plan Clinical Impression: Patient is a 89 year old right-handed male with history significant for anxiety, TIA/lacunar infarct, polyneuropathy. Presented to [facility] 09/02/2023 with persistent  left-sided weakness as well as a fall landing on his left arm..  No loss of consciousness.  CT/MRI showed acute right thalamocapsular infarction.  2 punctate acute infarcts in the right occipital lobe and possible small subacute infarct in the left frontal white matter.  CT angiogram head and neck occlusion versus critical stenosis of the right P2 PCA with poor/irregular distal opacification.  Severe distal left P2 PCA stenosis.  Moderate stenosis of the nondominant left intradural vertebral artery at its dural margin.  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 .  Patient transferred to CIR on 09/08/2023 .    Patient currently requires {YNW:2956213} with {YQM:5784696} secondary to {EXBMWUXLKGM:0102725}.  Prior to hospitalization, patient could complete *** with {DGU:4403474}.  Patient will benefit from skilled intervention to {benefit of skilled intervention:3041641} prior to discharge {discharge:3041642}.  Anticipate patient will require {supervision/assistance:22779} and {follow QV:9563875}.      OT Evaluation Precautions/Restrictions  Restrictions Weight Bearing  Restrictions: No Pain Pain Assessment Pain Scale: 0-10 Pain Score: 0-No pain Home Living/Prior Functioning Home Living Family/patient expects to be discharged to:: Private residence Living Arrangements: Spouse/significant other Vision   Perception    Praxis   Cognition   Sensation   Motor     Trunk/Postural Assessment     Balance   Extremity/Trunk Assessment      Care Tool Care Tool Self Care Eating        Oral Care         Bathing              Upper Body Dressing(including orthotics)            Lower Body Dressing (excluding footwear)          Putting on/Taking off footwear             Care Tool Toileting Toileting activity         Care Tool Bed Mobility Roll left and right activity        Sit to lying activity        Lying to sitting on side of bed activity         Care Tool Transfers Sit to stand transfer        Chair/bed transfer         Toilet transfer         Care Tool Cognition  Expression of Ideas and Wants    Understanding Verbal and Non-Verbal Content     Memory/Recall Ability     Refer to Care Plan for Long Term Goals  SHORT TERM GOAL WEEK 1    Recommendations for other services: {RECOMMENDATIONS FOR OTHER SERVICES:3049016}   Skilled Therapeutic  Intervention ADL   Mobility    Skilled Intervention  Discharge Criteria: Patient will be discharged from OT if patient refuses treatment 3 consecutive times without medical reason, if treatment goals not met, if there is a change in medical status, if patient makes no progress towards goals or if patient is discharged from hospital.  The above assessment, treatment plan, treatment alternatives and goals were discussed and mutually agreed upon: {Assessment/Treatment Plan Discussed/Agreed:3049017}  [name], OTR/L,CBIS  Supplemental OT - [facility] and [facility] Secure Chat Preferred   09/08/2023, 4:13 PM

## 2023-09-08 NOTE — Progress Notes (Signed)
Physical Therapy Treatment Patient Details Name: James [name]. MRN: [id number] DOB: 1934-05-05 Today's Date: 09/08/2023   History of Present Illness 89 y/o male presented to ER secondary to L-sided weakness; admitted for TIA/CVA work up.  Imaging significant for R thalamocapsular infarct, R occipital and L frontal infarcts.    PT Comments  Pt sitting EOB eating breakfast with forward lean.  RN in room stating it took max a to get to sitting today.  Observed pt while eating. Once tray removed he leans left and remains forward.  Says he feels tired and endorsed not feeling right today.  He is able to stand with min a x 2 for safety given sitting balance.  Hesitantly marches in place then is able to walk 20' x 1 in room with RW and min a x 2 with overall decreased gait quality today.  No overt signs of weakness differences in BUE with good even smile.  Some L tongue deviation.  Stand pivot transfer to/from recliner at bedside with min a x 1 and generally poor control which is more assist than yesterday.  Pt returned to supine with min a x 1 with cues which is different from yesterday as he was able to do with ease.  Discussed concerns with team.   If plan is discharge home, recommend the following: A lot of help with walking and/or transfers;A lot of help with bathing/dressing/bathroom;Assist for transportation;Help with stairs or ramp for entrance;Assistance with cooking/housework   Can travel by private vehicle        Equipment Recommendations  None recommended by PT    Recommendations for Other Services       Precautions / Restrictions Precautions Precautions: Fall Restrictions Weight Bearing Restrictions: No     Mobility  Bed Mobility Overal bed mobility: Needs Assistance Bed Mobility: Supine to Sit, Sit to Supine     Supine to sit: Max assist (per RN when getting to EOB to eat) Sit to supine: Min assist     Patient Response: Cooperative  Transfers Overall transfer  level: Needs assistance Equipment used: Rolling walker (2 wheels) Transfers: Sit to/from Stand Sit to Stand: Min assist Stand pivot transfers: Min assist              Ambulation/Gait Ambulation/Gait assistance: Min assist, +2 safety/equipment Gait Distance (Feet): 20 Feet Assistive device: Rolling walker (2 wheels) Gait Pattern/deviations: Step-through pattern, Shuffle, Staggering left, Staggering right, Drifts right/left, Narrow base of support, Decreased step length - right, Decreased step length - left, Steppage Gait velocity: decreased     General Gait Details: generlly fatiegued with increased forward lean today   Stairs             Wheelchair Mobility     Tilt Bed Tilt Bed Patient Response: Cooperative  Modified Rankin (Stroke Patients Only)       Balance Overall balance assessment: Needs assistance Sitting-balance support: Bilateral upper extremity supported, Feet unsupported Sitting balance-Leahy Scale: Fair Sitting balance - Comments: excessive forward and left lean today Postural control: Left lateral lean Standing balance support: Bilateral upper extremity supported Standing balance-Leahy Scale: Poor Standing balance comment: forward lean on walker with +2 assist for safety                            Cognition Arousal: Alert Behavior During Therapy: WFL for tasks assessed/performed Overall Cognitive Status: Within Functional Limits for tasks assessed Area of Impairment: Safety/judgement, Problem solving, Following commands, Awareness,  Memory                                        Exercises      General Comments        Pertinent Vitals/Pain Pain Assessment Pain Assessment: No/denies pain Pain Descriptors / Indicators: Sore Pain Intervention(s): Limited activity within patient's tolerance, Monitored during session, Repositioned    Home Living                          Prior Function             PT Goals (current goals can now be found in the care plan section) Progress towards PT goals: Not progressing toward goals - comment    Frequency    Min 1X/week      PT Plan      Co-evaluation              AM-PAC PT "6 Clicks" Mobility   Outcome Measure  Help needed turning from your back to your side while in a flat bed without using bedrails?: A Little Help needed moving from lying on your back to sitting on the side of a flat bed without using bedrails?: A Lot Help needed moving to and from a bed to a chair (including a wheelchair)?: A Little Help needed standing up from a chair using your arms (e.g., wheelchair or bedside chair)?: A Little Help needed to walk in hospital room?: A Lot Help needed climbing 3-5 steps with a railing? : A Lot 6 Click Score: 15    End of Session Equipment Utilized During Treatment: Gait belt Activity Tolerance: Patient tolerated treatment well Patient left: with call bell/phone within reach;in bed;with bed alarm set Nurse Communication: Mobility status PT Visit Diagnosis: Muscle weakness (generalized) (M62.81);Difficulty in walking, not elsewhere classified (R26.2);Hemiplegia and hemiparesis Hemiplegia - Right/Left: Left Hemiplegia - dominant/non-dominant: Non-dominant Hemiplegia - caused by: Cerebral infarction     Time: 0855-0906 PT Time Calculation (min) (ACUTE ONLY): 11 min  Charges:    $Gait Training: 8-22 mins PT General Charges $$ ACUTE PT VISIT: 1 Visit                   Danielle Dess, PTA 09/08/23, 9:15 AM

## 2023-09-08 NOTE — Consult Note (Signed)
ELECTROPHYSIOLOGY CONSULT NOTE  Patient ID: James [name]. MRN: [id number], DOB/AGE: 87-27-1935   Admit date: 09/02/2023 Date of Consult: 09/08/2023  Primary Physician: Marguarite Arbour, MD Primary Cardiologist: None  Primary Electrophysiologist: New to None  Reason for Consultation: Cryptogenic stroke; recommendations regarding Implantable Loop Recorder Insurance: [employer]  History of Present Illness EP has been asked to evaluate James [name]. for placement of an implantable loop recorder to monitor for atrial fibrillation by Dr  Wilford Corner .  The patient was admitted on 09/02/2023 with left-sided weakness. Two days prior to admission, he noticed his left upper extremity was weaker. He also bent down and fell on his let side.   Brain MIR revealed R thalamic capsular infarct and 2 punctate infarts in the right occipital lobe with possible small subacute infarct in the left frontal white matter with chronic microvascular ischemic changes.     He has undergone workup for stroke including:  TTE with LVEF 55-60%, grade 1 DD Continue telemetry Frequent rechecks Aspirin 81+ Plavix 75 for 3 months followed by aspirin only High intensity statin-atorvastatin 40 for goal LDL less than 70. A1c is at goal PT OT speech therapy    The patient has been monitored on telemetry which has demonstrated sinus rhythm with no arrhythmias.  Inpatient stroke work-up will not require a TEE per Neurology.   Echocardiogram as above. Lab work is reviewed.  Prior to admission, the patient denies chest pain, shortness of breath, dizziness, palpitations, or syncope.  He is recovering from his stroke with plans to attend CIR  at discharge.  Allergies, Past Medical, Surgical, Social, and Family Histories have been reviewed and are referenced here-in when relevant for medical decision making.   Inpatient Medications:   aspirin EC  81 mg Oral Daily   atorvastatin  40 mg Oral Daily    clopidogrel  75 mg Oral Daily   enoxaparin (LOVENOX) injection  40 mg Subcutaneous Q24H   finasteride  5 mg Oral Daily   polyethylene glycol  17 g Oral Daily   tamsulosin  0.4 mg Oral Daily   traZODone  100 mg Oral QHS    Physical Exam: Vitals:   09/08/23 0019 09/08/23 0353 09/08/23 0807 09/08/23 1152  BP: 138/76 115/77 (!) 126/59 117/62  Pulse: 87 84 (!) 54 (!) 57  Resp: 18 18 16 18   Temp: 97.7 F (36.5 C) 98.2 F (36.8 C) 98 F (36.7 C) 98.6 F (37 C)  TempSrc: Oral Oral  Oral  SpO2: 97% 96% 97% 97%  Weight:      Height:        GEN- NAD. A&O x 3. Normal affect. HEENT: Normocephalic, atraumatic Lungs- CTAB, Normal effort.  Heart- Regular rate and rhythm rate and rhythm. No M/G/R.  Extremities- No peripheral edema. no clubbing or cyanosis Skin- warm and dry, no rash or lesion. Neuro weakness on L   12-lead ECG  (personally reviewed) All prior EKG's in EPIC reviewed with no documented atrial fibrillation  Telemetry NSR (personally reviewed)  Assessment and Plan:  1. Cryptogenic stroke The patient presents with cryptogenic stroke.  The patient does not have a TEE planned for this AM.  I spoke at length with the patient about monitoring for afib with an implantable loop recorder.  Risks, benefits, and alteratives to implantable loop recorder were discussed with the patient today.   At this time, the patient refuses loop consideration, and has opted to wear an event monitor. If the patient  chooses to leave and be considered for outpatient loop recorder, the co-pay usually ranges from $0-250 depending on insurance. The patient or family can call insurance and provide CPT code 40981.     Please call with questions.   The patient will have follow-up scheduled with EP APP in 4-6 weeks for further discussion.   Sherie Don, NP 09/08/2023 12:42 PM

## 2023-09-09 DIAGNOSIS — N401 Enlarged prostate with lower urinary tract symptoms: Secondary | ICD-10-CM | POA: Diagnosis not present

## 2023-09-09 DIAGNOSIS — K5900 Constipation, unspecified: Secondary | ICD-10-CM

## 2023-09-09 DIAGNOSIS — T801XXD Vascular complications following infusion, transfusion and therapeutic injection, subsequent encounter: Secondary | ICD-10-CM

## 2023-09-09 DIAGNOSIS — I6381 Other cerebral infarction due to occlusion or stenosis of small artery: Secondary | ICD-10-CM | POA: Diagnosis not present

## 2023-09-09 LAB — CBC WITH DIFFERENTIAL/PLATELET
Abs Immature Granulocytes: 0.01 10*3/uL (ref 0.00–0.07)
Basophils Absolute: 0.1 10*3/uL (ref 0.0–0.1)
Basophils Relative: 2 %
Eosinophils Absolute: 0.2 10*3/uL (ref 0.0–0.5)
Eosinophils Relative: 4 %
HCT: 42.8 % (ref 39.0–52.0)
Hemoglobin: 13.8 g/dL (ref 13.0–17.0)
Immature Granulocytes: 0 %
Lymphocytes Relative: 23 %
Lymphs Abs: 1.2 10*3/uL (ref 0.7–4.0)
MCH: 29.7 pg (ref 26.0–34.0)
MCHC: 32.2 g/dL (ref 30.0–36.0)
MCV: 92 fL (ref 80.0–100.0)
Monocytes Absolute: 0.4 10*3/uL (ref 0.1–1.0)
Monocytes Relative: 8 %
Neutro Abs: 3.3 10*3/uL (ref 1.7–7.7)
Neutrophils Relative %: 63 %
Platelets: 182 10*3/uL (ref 150–400)
RBC: 4.65 MIL/uL (ref 4.22–5.81)
RDW: 13 % (ref 11.5–15.5)
WBC: 5.3 10*3/uL (ref 4.0–10.5)
nRBC: 0 % (ref 0.0–0.2)

## 2023-09-09 LAB — COMPREHENSIVE METABOLIC PANEL
ALT: 29 U/L (ref 0–44)
AST: 33 U/L (ref 15–41)
Albumin: 3.5 g/dL (ref 3.5–5.0)
Alkaline Phosphatase: 68 U/L (ref 38–126)
Anion gap: 7 (ref 5–15)
BUN: 24 mg/dL — ABNORMAL HIGH (ref 8–23)
CO2: 28 mmol/L (ref 22–32)
Calcium: 9.2 mg/dL (ref 8.9–10.3)
Chloride: 104 mmol/L (ref 98–111)
Creatinine, Ser: 1.22 mg/dL (ref 0.61–1.24)
GFR, Estimated: 57 mL/min — ABNORMAL LOW (ref >60)
Glucose, Bld: 141 mg/dL — ABNORMAL HIGH (ref 70–99)
Potassium: 3.8 mmol/L (ref 3.5–5.1)
Sodium: 139 mmol/L (ref 135–145)
Total Bilirubin: 0.7 mg/dL (ref 0.3–1.2)
Total Protein: 7.5 g/dL (ref 6.5–8.1)

## 2023-09-09 MED ORDER — CALCIUM CARBONATE ANTACID 500 MG PO CHEW
1.0000 | CHEWABLE_TABLET | Freq: Two times a day (BID) | ORAL | Status: DC | PRN
  Administered 2023-09-09 – 2023-09-15 (×5): 200 mg via ORAL
  Filled 2023-09-09 (×6): qty 1

## 2023-09-09 NOTE — Progress Notes (Signed)
Inpatient Rehabilitation  Patient information reviewed and entered into eRehab system by Demarrio Menges Justis Closser, OTR/L, Rehab Quality Coordinator.   Information including medical coding, functional ability and quality indicators will be reviewed and updated through discharge.   

## 2023-09-09 NOTE — Progress Notes (Signed)
Patient up in recliner at end of shift. Report given to night shift nurse. Patient has safety belt on and activated. Call bell with in reach. Cletis Media, RN

## 2023-09-09 NOTE — Plan of Care (Signed)
  Problem: RH Swallowing Goal: LTG Patient will consume least restrictive diet using compensatory strategies with assistance (SLP) Description: LTG:  Patient will consume least restrictive diet using compensatory strategies with assistance (SLP) Flowsheets (Taken 09/09/2023 1026) LTG: Pt Patient will consume least restrictive diet using compensatory strategies with assistance of (SLP): Modified Independent   Problem: RH Problem Solving Goal: LTG Patient will demonstrate problem solving for (SLP) Description: LTG:  Patient will demonstrate problem solving for basic/complex daily situations with cues  (SLP) Flowsheets (Taken 09/09/2023 1026) LTG: Patient will demonstrate problem solving for (SLP): (mildly complex) Other (comment) LTG Patient will demonstrate problem solving for: Supervision   Problem: RH Memory Goal: LTG Patient will use memory compensatory aids to (SLP) Description: LTG:  Patient will use memory compensatory aids to recall biographical/new, daily complex information with cues (SLP) Flowsheets (Taken 09/09/2023 1026) LTG: Patient will use memory compensatory aids to (SLP): Supervision

## 2023-09-09 NOTE — Progress Notes (Addendum)
PROGRESS NOTE   Subjective/Complaints: No acute events overnight noted.  Reports last bowel movement yesterday.  He felt constipated before this bowel movement.  Reports bladder urgency but appears to be over all continent of bladder.  ROS: Denies fever, chills, shortness of breath, chest pain, abdominal pain, nausea, vomiting, rash Objective:   CT HEAD WO CONTRAST ( )  Result Date: 09/08/2023 CLINICAL DATA:  Stroke, follow-up.  Acute onset left-sided weakness. EXAM: CT HEAD WITHOUT CONTRAST TECHNIQUE: Contiguous axial images were obtained from the base of the skull through the vertex without intravenous contrast. RADIATION DOSE REDUCTION: This exam was performed according to the departmental dose-optimization program which includes automated exposure control, adjustment of the mA and/or kV according to patient size and/or use of iterative reconstruction technique. COMPARISON:  Head CT and CTA head/neck 09/02/2023. MRI brain 09/02/2023. FINDINGS: Brain: Increased conspicuity of the now subacute infarct in the right lateral thalamus. Additional foci of acute infarcts seen on recent brain MRI are below the resolution of CT. No acute hemorrhage or significant mass effect. Stable background of severe chronic small-vessel disease. No new loss of gray-white differentiation. Unchanged bilateral hippocampal atrophy. No acute hydrocephalus or extra-axial collection. Vascular: No hyperdense vessel or unexpected calcification. Skull: No calvarial fracture or suspicious bone lesion. Skull base is unremarkable. Sinuses/Orbits: No acute finding. Other: None. IMPRESSION: 1. Increased conspicuity of the now subacute infarct in the right lateral thalamus. Additional foci of acute infarcts seen on recent brain MRI are below the resolution of CT. No acute hemorrhage or significant mass effect. 2. Stable background of severe chronic small-vessel disease.  Electronically Signed   By: Orvan Falconer M.D.   On: 09/08/2023 11:28   Recent Labs    09/09/23 0748  WBC 5.3  HGB 13.8  HCT 42.8  PLT 182   Recent Labs    09/09/23 0748  NA 139  K 3.8  CL 104  CO2 28  GLUCOSE 141*  BUN 24*  CREATININE 1.22  CALCIUM 9.2    Intake/Output Summary (Last 24 hours) at 09/09/2023 1225 Last data filed at 09/09/2023 0720 Gross per 24 hour  Intake 360 ml  Output 750 ml  Net -390 ml        Physical Exam: Vital Signs Blood pressure (!) 140/78, pulse (!) 101, temperature 97.8 F (36.6 C), resp. rate 17, weight 68.7 kg, SpO2 96%.   General: No apparent distress HEENT: Head is normocephalic, atraumatic, hard of hearing, mucous membranes dry Neck: Supple without JVD or lymphadenopathy Heart: Reg rate and rhythm. Chest: CTA bilaterally without wheezes, rales, or rhonchi; no distress Abdomen: Soft, non-tender, non-distended, bowel sounds positive. Extremities: No clubbing, cyanosis, or edema. Pulses are 2+ Psych: Pt's affect is appropriate. Pt is cooperative Skin: Area of reddened skin left AC fossa from prior IV infiltration Neuro: Alert and oriented to name, month, hospital.  Facial asymmetry present.  Facial sensation intact.  Mildly dysarthric speech. Decreased sensation to light touch left upper extremity left lower extremity RUE- 5/5 throughout LUE- 4+/5 but cannot make fist- of note, fingers on L hand smaller than R hand- smaller circumference RLE- 5/5 throughout LLE- HF 4/5; KE 4+/5; DF/PF 5-/5  Musculoskeletal: No joint  swelling noted   Assessment/Plan: 1. Functional deficits which require 3+ hours per day of interdisciplinary therapy in a comprehensive inpatient rehab setting. Physiatrist is providing close team supervision and 24 hour management of active medical problems listed below. Physiatrist and rehab team continue to assess barriers to discharge/monitor patient progress toward functional and medical goals  Care  Tool:  Bathing              Bathing assist       Upper Body Dressing/Undressing Upper body dressing        Upper body assist      Lower Body Dressing/Undressing Lower body dressing            Lower body assist       Toileting Toileting    Toileting assist       Transfers Chair/bed transfer  Transfers assist           Locomotion Ambulation   Ambulation assist              Walk 10 feet activity   Assist           Walk 50 feet activity   Assist           Walk 150 feet activity   Assist           Walk 10 feet on uneven surface  activity   Assist           Wheelchair     Assist               Wheelchair 50 feet with 2 turns activity    Assist            Wheelchair 150 feet activity     Assist          Blood pressure (!) 140/78, pulse (!) 101, temperature 97.8 F (36.6 C), resp. rate 17, weight 68.7 kg, SpO2 96%.  Medical Problem List and Plan: 1. Functional deficits secondary to right thalamic capsular infarct and 2 punctate infarcts in the right occipital lobe with a possible small subacute infarct in the left frontal white matter with chronic microvascular ischemic changes             -patient may  shower             -ELOS/Goals: 10-14 days, supervision PT/OT/ST  -Continue CIR  -Team conference today please see physician documentation under team conference tab, met with team  to discuss problems,progress, and goals. Formulized individual treatment plan based on medical history, underlying problem and comorbidities.   2.  Antithrombotics: -DVT/anticoagulation:  Pharmaceutical: Lovenox             -antiplatelet therapy: Aspirin 81 mg daily and Plavix 75 mg daily x 3 months then aspirin alone 3. Pain Management: Tylenol as needed 4. Mood/Behavior/Sleep: Trazodone 100 mg nightly, Xanax 1 mg twice daily as needed             -antipsychotic agents: N/A 5. Neuropsych/cognition: This  patient is capable of making decisions on his own behalf. 6. Skin/Wound Care: Routine skin checks 7. Fluids/Electrolytes/Nutrition: Routine in and outs with follow-up chemistries 8.  BPH.  Proscar 5 mg daily/Flomax 0.4 mg daily  -9/25 continent of bladder for past several voids, continue to monitor 9.  Hyperlipidemia.  Lipitor 10.  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  Follow-up outpatient CT chest CTA 11. IV infiltration on L AC fossa- warm compresses  -9/25 monitor for improvement  12.  Constipation.   -9/25 Improved with bowel movement yesterday.  Continue to monitor bowel function  LOS: 1 days A FACE TO FACE EVALUATION WAS PERFORMED  Fanny Dance 09/09/2023, 12:25 PM

## 2023-09-09 NOTE — Patient Care Conference (Signed)
Inpatient RehabilitationTeam Conference and Plan of Care Update Date: 09/10/2023   Time: 12:12 PM   Patient Name: James [name].      Medical Record Number: [id number]  Date of Birth: August 17, 1934 Sex: Male         Room/Bed: 4W19C/4W19C-01 Payor Info: Payor: Advertising copywriter MEDICARE / Plan: [facility] MEDICARE / Product Type: *No Product type* /    Admit Date/Time:  09/08/2023  2:39 PM  Primary Diagnosis:  Right thalamic infarction [facility])  Hospital Problems: Principal Problem:   Right thalamic infarction [facility])    Expected Discharge Date: Expected Discharge Date:  (evals pending)  Team Members Present: Physician leading conference: Dr. Fanny Dance Social Worker Present: Dossie Der, LCSW Nurse Present: Chana Bode, RN PT Present: Ambrose Finland, PT OT Present: Valetta Fuller, OT PPS Coordinator present : Edson Snowball, PT     Current Status/Progress Goal Weekly Team Focus  Bowel/Bladder   Pt is continent of b/b. Per notes, pt have some occasional incontinent episodes. LBM: 9/24   Pt will have consistent continence of b/b by timed toileting.   Assist w/ toileting needs as needed.    Swallow/Nutrition/ Hydration               ADL's      Evals pending          Mobility      Evals pending         Communication                Safety/Cognition/ Behavioral Observations               Pain   Pt denies pain.   Remain pain free.   Assess pain q shift & PRN.    Skin   Stage 1 to coccyx - foam in place. R shin wound - foam in place. R elbow skin tear - foam in place. Redness to L arm d/t phlebitis.   Maintain skin integrity & prevent further breakdown.  Assess skin q shift & PRN.      Discharge Planning:  HOme with wife who is currently getting chemo for bladder cancer-three children involved and will assist. New evaluation today   Team Discussion: Patient post right thalamic infarct. Noted wet vocal quality, coughing with water intake; changed to  nectar thickened liquid diet with H2O protocol.  Patient on target to meet rehab goals: Evals pending  *See Care Plan and progress notes for long and short-term goals.   Revisions to Treatment Plan:  H2O protocol/Nectar thick liquids   Teaching Needs: Safety, medications, dietary modifcations, transfers, toileting, etc.   Current Barriers to Discharge: Decreased caregiver support, Home enviroment access/layout, and Wound care  Possible Resolutions to Barriers: Family education     Medical Summary Current Status: CVA,BPH, Constipation,IV infiltration  Barriers to Discharge: Medical stability  Barriers to Discharge Comments: CVA,BPH, Constipation, IV infiltration Possible Resolutions to [employer] Focus: Monitor bowel and bladder function, monitor skin integrety   Continued Need for Acute Rehabilitation Level of Care: The patient requires daily medical management by a physician with specialized training in physical medicine and rehabilitation for the following reasons: Direction of a multidisciplinary physical rehabilitation program to maximize functional independence : Yes Medical management of patient stability for increased activity during participation in an intensive rehabilitation regime.: Yes Analysis of laboratory values and/or radiology reports with any subsequent need for medication adjustment and/or medical intervention. : Yes   I attest that I was present, lead the team conference, and  concur with the assessment and plan of the team.   [name] 09/10/2023, 8:44 AM

## 2023-09-09 NOTE — Progress Notes (Addendum)
Inpatient Rehabilitation Care Coordinator Assessment and Plan Patient Details  Name: James [name]. MRN: [id number] Date of Birth: 04-19-1934  Today's Date: 09/09/2023  Hospital Problems: Principal Problem:   Right thalamic infarction [facility])  Past Medical History:  Past Medical History:  Diagnosis Date   Anxiety    TIA (transient ischemic attack)    Past Surgical History:  Past Surgical History:  Procedure Laterality Date   HAND SURGERY Left    HEMORRHOID SURGERY     TONSILLECTOMY AND ADENOIDECTOMY     Social History:  reports that he has quit smoking. His smoking use included cigarettes. He has never used smokeless tobacco. He reports that he does not currently use alcohol. He reports that he does not use drugs.  Family / Support Systems Marital Status: Married Patient Roles: Spouse, Parent, Other (Comment) (retiree) Spouse/Significant Other: James [name] [phone] Children: James [name] [phone]  James [name] [phone]  margaret-daughter [phone] Other Supports: neighbors Anticipated Caregiver: Children are trying to come up with a plan for both of thier parents Ability/Limitations of Caregiver: Wife is being treated for bladder cancer, two children live in [place] and one is disabled Caregiver Availability: Other (Comment) (Need to come up with a plan for both of them) Family Dynamics: Close knit with all three children who are involved and trying to assist their Mom while pt is here. They do have church members who will call them but support is their children  Social History Preferred language: English Religion: Catholic Cultural Background: No issues Education: Charity fundraiser - How often do you need to have someone help you when you read instructions, pamphlets, or other written material from your doctor or pharmacy?: Never Writes: Yes Employment Status: Retired Marine scientist Issues: NO issues Guardian/Conservator: None-according to MD pt is  capable of making his own decisions while here. Family will try to have someone here to provide support   Abuse/Neglect Abuse/Neglect Assessment Can Be Completed: Yes Physical Abuse: Denies Verbal Abuse: Denies Sexual Abuse: Denies Exploitation of patient/patient's resources: Denies Self-Neglect: Denies  Patient response to: Social Isolation - How often do you feel lonely or isolated from those around you?: Never  Emotional Status Pt's affect, behavior and adjustment status: Pt is wanting to get as independent as he can be with his wife going thorugh chemo she can not assist him and he does not want to burden his children. He was falling at home prior to admission x 3 and wife is saying the children are looking or a place for both of them in [place] Recent Psychosocial Issues: other health issues Psychiatric History: History of anxiety-takes medications for this and finds helpfu. May benefit from seeing neuro-psych while here with all going on with both he and wife Substance Abuse History: No issues  Patient / Family Perceptions, Expectations & Goals Pt/Family understanding of illness & functional limitations: Pt and wife can explain his stroke and deficits he has, both are hopeful he will do well here. Pt has spoken with the MD and feel understands his treatment plan moving forward. Premorbid pt/family roles/activities: husband, father, grandfather, retiree, church member. etc Anticipated changes in roles/activities/participation: resume Pt/family expectations/goals: Pt states: " I need to do for myself my wife can not help me."  [employer]: None Premorbid Home Care/DME Agencies: Other (Comment) (rw, tub seat and cane) Transportation available at discharge: wife and children Is the patient able to respond to transportation needs?: Yes In the past 12 months, has lack of transportation kept you from medical  appointments or from getting medications?: No In the  past 12 months, has lack of transportation kept you from meetings, work, or from getting things needed for daily living?: No Resource referrals recommended: Neuropsychology  Discharge Planning Living Arrangements: Spouse/significant other Support Systems: Spouse/significant other, Children, Friends/neighbors, Church/faith community Type of Residence: Private residence Insurance Resources: Media planner (specify) Investment banker, operational) Financial Resources: Restaurant manager, fast food Screen Referred: No Living Expenses: Own Money Management: Patient, Spouse Does the patient have any problems obtaining your medications?: No Home Management: both Patient/Family Preliminary Plans: Unsue of the plan may need to return home in the short term until the kids can find a place for both of them and closer to them here in [place]. Made aware will be here approx 10 days and will need supervision level. Team still evaluating him and setting goals. Care Coordinator Barriers to Discharge: Decreased caregiver support, Insurance for SNF coverage Care Coordinator Anticipated Follow Up Needs: HH/OP  Clinical Impression  Pleasant gentleman who is here due to a CVA. His wife is currently being treated for bladder cancer. They have three children who are involved and supportive. According to wife they are looking for a place for both of them in [place] closer to two children. Will await therapy evaluations and work on discharge plans. Son-Chris present and introduced self to him.  Lucy Chris 09/09/2023, 2:36 PM

## 2023-09-09 NOTE — Evaluation (Signed)
Physical Therapy Assessment and Plan  Patient Details  Name: James [name]. MRN: [id number] Date of Birth: 1934-08-17  PT Diagnosis: Abnormal posture, Abnormality of gait, Cognitive deficits, Difficulty walking, Impaired cognition, Impaired sensation, and Muscle weakness Rehab Potential: Good ELOS: 7-10 days   Today's Date: 09/09/2023 PT Individual Time: 4098-1191 PT Individual Time Calculation (min): 72 min    Hospital Problem: Principal Problem:   Right thalamic infarction [facility])   Past Medical History:  Past Medical History:  Diagnosis Date   Anxiety    TIA (transient ischemic attack)    Past Surgical History:  Past Surgical History:  Procedure Laterality Date   HAND SURGERY Left    HEMORRHOID SURGERY     TONSILLECTOMY AND ADENOIDECTOMY      Assessment & Plan Clinical Impression: Patient is a 89 y.o. year old male with history significant for anxiety, TIA/lacunar infarct, polyneuropathy followed by [facility]. [facility] BPH, mild cognitive impairment and tobacco use.  Per chart review patient lives with spouse.  Two-level home with bed and bath main level one-step to entry.  Modified independent with rolling walker/straight point cane.  No driving.  Was actively participating with home health therapies prior to admission.  He does have a home health aide that comes in regularly to assist with ADLs.  Presented to [facility] 09/02/2023 with persistent  left-sided weakness as well as a fall landing on his left arm..  No loss of consciousness.  CT/MRI showed acute right thalamocapsular infarction.  2 punctate acute infarcts in the right occipital lobe and possible small subacute infarct in the left frontal white matter.  CT angiogram head and neck occlusion versus critical stenosis of the right P2 PCA with poor/irregular distal opacification.  Severe distal left P2 PCA stenosis.  Moderate stenosis of the nondominant left intradural vertebral artery at its dural margin.   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  Admission chemistries unremarkable except BUN 24 creatinine 1.31, urinalysis negative nitrite.  Echocardiogram ejection fraction of 55 to 60% no wall motion abnormalities grade 1 diastolic dysfunction.  Neurology follow-up maintained on low-dose aspirin 81 mg daily and Plavix 75 mg daily for CVA prophylaxis x 3 months then aspirin alone.  Lovenox for DVT prophylaxis.  Tolerating a regular consistency diet.  Therapy evaluations completed due to patient decreased functional ability and left-sided weakness was admitted for a comprehensive rehab program.       Patient currently requires min with mobility secondary to muscle weakness, decreased cardiorespiratoy endurance, decreased coordination and decreased motor planning, decreased motor planning, decreased problem solving, decreased safety awareness, and decreased memory, and decreased sitting balance, decreased standing balance, decreased postural control, and decreased balance strategies.  Prior to hospitalization, patient was independent  with mobility and lived with Spouse in a House home.  Home access is level entry per pt son.   Patient will benefit from skilled PT intervention to maximize safe functional mobility, minimize fall risk, and decrease caregiver burden for planned discharge home with 24 hour supervision.  Anticipate patient will benefit from follow up OP at discharge.  PT - End of Session Activity Tolerance: Tolerates 30+ min activity with multiple rests Endurance Deficit: Yes Endurance Deficit Description: impaired. SOB and fatigue noted with activity PT Assessment Rehab Potential (ACUTE/IP ONLY): Good PT Barriers to Discharge: Decreased caregiver support;Incontinence;Lack of/limited family support;Behavior PT Patient demonstrates impairments in the following area(s): Balance;Behavior;Endurance;Motor;Safety;Sensory;Skin Integrity PT Transfers  Functional Problem(s): Bed Mobility;Bed to Chair;Car PT  Locomotion Functional Problem(s): Ambulation;Wheelchair Mobility;Stairs PT Plan PT Intensity: Minimum of 1-2 x/day ,45 to 90 minutes PT Frequency: 5 out of 7 days PT Duration Estimated Length of Stay: 7-10 days PT Treatment/Interventions: Ambulation/gait training;Discharge planning;Functional mobility training;Psychosocial support;Therapeutic Activities;Visual/perceptual remediation/compensation;Balance/vestibular training;Disease management/prevention;Neuromuscular re-education;Skin care/wound management;Therapeutic Exercise;Wheelchair propulsion/positioning;Cognitive remediation/compensation;DME/adaptive equipment instruction;Pain management;Splinting/orthotics;UE/LE Strength taining/ROM;Community reintegration;Functional electrical stimulation;Patient/family education;Stair training;UE/LE Coordination activities PT Transfers Anticipated Outcome(s): supervision PT Locomotion Anticipated Outcome(s): supervision PT Recommendation Follow Up Recommendations: Home health PT Patient destination: Home (pt family looking into an assisted living) Equipment Details: pt has RW, rollator, WC, and SPC, and shower chair   PT Evaluation Precautions/Restrictions Precautions Precautions: Fall;Other (comment) Precaution Comments: Zio patch, DNR Restrictions Weight Bearing Restrictions: No Pain Interference Pain Interference Pain Effect on Sleep: 1. Rarely or not at all Pain Interference with Therapy Activities: 1. Rarely or not at all Pain Interference with Day-to-Day Activities: 1. Rarely or not at all Home Living/Prior Functioning Home Living Living Arrangements: Spouse/significant other Available Help at Discharge: Family;Available 24 hours/day (pt son reports they will make sure he has someone around the clock, family is looking into an assisted living) Type of Home: House Home Access: Stairs to enter [employer] of Steps: level  entry per pt son Entrance Stairs-Rails: None Home Layout: Able to live on main level with bedroom/bathroom;Two level Alternate Level Stairs-Number of Steps: no essential needs on upper level of home Bathroom Shower/Tub: Walk-in shower;Door (shower seat) Insurance claims handler Accessibility: Yes Additional Comments: PLOF and home set up per pt and son, pt son reports pt was receiving HHPT once per week, and had aide for housekeeping, wife does all cooking.  Lives With: Spouse Prior Function Level of Independence: Independent with basic ADLs;Independent with gait;Independent with transfers;Needs assistance with ADLs (has assist for light housekeeping, wife does all cooking)  Able to Take Stairs?: No Driving: No Vocation: Retired Gaffer: Architectural technologist Vision/Perception  Vision - History Ability to See in Adequate Light: 0 Adequate Vision - Assessment Eye Alignment: Within Functional Limits Tracking/Visual Pursuits: Requires cues, head turns, or add eye shifts to track;Impaired - to be further tested in functional context;Decreased smoothness of horizontal tracking;Decreased smoothness of vertical tracking Saccades: Additional head turns occurred during testing;Decreased speed of saccadic movement;Additional eye shifts occurred during testing;Other (comment) Convergence: Impaired (comment) Perception Perception: Impaired Preception Impairment Details: Inattention/Neglect Perception-Other Comments: left side (UE) inattention Praxis Praxis: Impaired Praxis Impairment Details: Motor planning  Cognition Overall Cognitive Status: Impaired/Different from baseline Arousal/Alertness: Awake/alert Orientation Level: Oriented X4 Year: 2024 Month: September Day of Week: Correct Attention: Focused;Sustained Focused Attention: Appears intact Sustained Attention: Appears intact Memory: Impaired Memory Impairment: Decreased recall of new information;Decreased  short term memory Decreased Short Term Memory: Verbal complex;Functional complex Awareness: Appears intact Problem Solving: Impaired Problem Solving Impairment: Verbal complex;Functional complex Behaviors: Impulsive Safety/Judgment: Impaired Sensation Sensation Light Touch: Impaired Detail Light Touch Impaired Details: Impaired LUE;Impaired LLE Hot/Cold: Not tested Proprioception: Appears Intact Stereognosis: Impaired by gross assessment Additional Comments: pt reports numbness and tinling B LE L LE> R LE, pt able to detect light touch in all dermatomes bilaterally, reports light touch feels the same B Coordination Gross Motor Movements are Fluid and Coordinated: No Fine Motor Movements are Fluid and Coordinated: No Motor  Motor Motor: Abnormal postural alignment and control   Trunk/Postural Assessment  Cervical Assessment Cervical Assessment: Exceptions to [facility] (forward head) Thoracic Assessment Thoracic Assessment: Exceptions to [facility] (rounded shoulders) Lumbar Assessment Lumbar Assessment: Exceptions to [facility] (posteiror pelvic tilt) Postural Control Postural Control: Deficits  on evaluation Righting Reactions: delayed Protective Responses: delayed Postural Limitations: significant forward trunk lean while standing with no AD and R HHA  Balance Balance Balance Assessed: Yes Static Sitting Balance Static Sitting - Balance Support: No upper extremity supported;Feet supported Static Sitting - Level of Assistance: 5: Stand by assistance Dynamic Sitting Balance Dynamic Sitting - Balance Support: No upper extremity supported;During functional activity Dynamic Sitting - Level of Assistance: 5: Stand by assistance Static Standing Balance Static Standing - Balance Support: During functional activity Static Standing - Level of Assistance: 4: Min assist Dynamic Standing Balance Dynamic Standing - Balance Support: During functional activity;Bilateral upper extremity supported Dynamic  Standing - Comments: pt required +2 for B HHA for stand pivot transfer and ambulation with no AD Extremity Assessment  RLE Assessment RLE Assessment: Exceptions to [facility] General Strength Comments: grossly 4/5 LLE Assessment LLE Assessment: Exceptions to [facility] General Strength Comments: grossly 4/5  Care Tool Care Tool Bed Mobility Roll left and right activity   Roll left and right assist level: Minimal Assistance - Patient > 75%    Sit to lying activity   Sit to lying assist level: Supervision/Verbal cueing    Lying to sitting on side of bed activity   Lying to sitting on side of bed assist level: the ability to move from lying on the back to sitting on the side of the bed with no back support.: Minimal Assistance - Patient > 75%     Care Tool Transfers Sit to stand transfer   Sit to stand assist level: Minimal Assistance - Patient > 75%    Chair/bed transfer   Chair/bed transfer assist level: 2 Helpers (+2 for B HHA with no AD (for pt comfort 2/2 FOF))     Toilet transfer   Assist Level: Minimal Assistance - Patient > 75%    Car transfer   Car transfer assist level: 2 helpers (stand pivot transfer with +2 for B HHA with no AD (for pt comfort 2/2 FOF))      Care Tool Locomotion Ambulation   Assist level: 2 helpers (+2 for B HHA with no AD (for pt comfort 2/2 FOF)) Assistive device: No Device (25 feet with no AD and B HHA, 125 feet with RW and min A) Max distance: 125  Walk 10 feet activity   Assist level: 2 helpers Assistive device: No Device   Walk 50 feet with 2 turns activity   Assist level: Minimal Assistance - Patient > 75% Assistive device: Walker-rolling  Walk 150 feet activity Walk 150 feet activity did not occur: Safety/medical concerns      Walk 10 feet on uneven surfaces activity   Assist level: Minimal Assistance - Patient > 75% Assistive device: Walker-rolling  Stairs Stair activity did not occur: Safety/medical concerns        Walk up/down 1 step  activity Walk up/down 1 step or curb (drop down) activity did not occur: Safety/medical concerns      Walk up/down 4 steps activity Walk up/down 4 steps activity did not occur: Safety/medical concerns      Walk up/down 12 steps activity Walk up/down 12 steps activity did not occur: Safety/medical concerns      Pick up small objects from floor   Pick up small object from the floor assist level: Total Assistance - Patient < 25% Pick up small object from the floor assistive device: no device  Wheelchair Is the patient using a wheelchair?: Yes Type of Wheelchair: Manual   Wheelchair assist level: Total  Assistance - Patient < 25%    Wheel 50 feet with 2 turns activity   Assist Level: Total Assistance - Patient < 25%  Wheel 150 feet activity   Assist Level: Total Assistance - Patient < 25%    Refer to Care Plan for Long Term Goals  SHORT TERM GOAL WEEK 1 PT Short Term Goal 1 (Week 1): STG=LTG 2/2 ELOS  Recommendations for other services: None   Skilled Therapeutic Intervention Mobility Bed Mobility Bed Mobility: Rolling Right;Rolling Left;Supine to Sit;Sit to Supine Rolling Right: Minimal Assistance - Patient > 75% Rolling Left: Minimal Assistance - Patient > 75% Supine to Sit: Minimal Assistance - Patient > 75% Sit to Supine: Supervision/Verbal cueing Transfers Transfers: Sit to Stand;Stand to Sit;Stand Pivot Transfers Sit to Stand: Minimal Assistance - Patient > 75% (R HHA) Stand to Sit: Minimal Assistance - Patient > 75% (+2 for B HHA with no AD (for pt comfort 2/2 FOF)) Stand Pivot Transfers: 2 Helpers (+2 for B HHA with no AD (for pt comfort 2/2 FOF)) Transfer (Assistive device): None Locomotion  Gait Ambulation: Yes Gait Distance (Feet): 125 Feet Assistive device: None;Rolling walker (25 feet with no AD and +2 for B HHA (2/2 FOF), 125 feet with RW and min A) Gait Gait: Yes Gait Pattern: Impaired Gait Pattern: Step-to pattern;Decreased step length - left;Decreased  step length - right;Trunk flexed;Decreased dorsiflexion - left Gait velocity: decreased Stairs / Additional Locomotion Stairs: No Wheelchair Mobility Wheelchair Mobility: Yes Wheelchair Assistance: Total Assistance - Patient <25% Wheelchair Parts Management: Needs assistance   Discharge Criteria: Patient will be discharged from PT if patient refuses treatment 3 consecutive times without medical reason, if treatment goals not met, if there is a change in medical status, if patient makes no progress towards goals or if patient is discharged from hospital.  The above assessment, treatment plan, treatment alternatives and goals were discussed and mutually agreed upon: by patient and by family  Today's Interventions   Pt seated in recliner upon arrival. Pt agreeable to therapy. Pt denies any pain. Therapist brought 16x16 WC to room 2/2 endurance deficits. Evaluation completed (see details above and below) with education on PT POC and goals and individual treatment initiated with focus on safety with RW.   Pt son present throughout session to verify information 2/2 cognitive deficits. Pt son reports pt had mild cognitive impairment at baseline but memory more impaired. Pt son reports mobility better than it was when pt came to hospital but not at baseline. Pt son reports but was not using an AD consistently at baseline, intermittently used SPC, or rollator, but could have benefited from using regularly. Pt reports son has no stairs for home entry and was not navigating stairs at baseline.   Evaluation completed with no AD, as pt not consistently using AD at baseline. Pt demos signficant FOF, ambulated 3 steps with R HHA, 25 feet with +2 A for B HHA, and 125 feet with RW and min A, verbal and tactile cues for safety with RW. Pt stood with R HHA and min A. Attemtped stand pivot transfer without an AD with R HHA, pt fearful and grasping for walker, completed with +2 B HHA. Pt demos significant forward  trunk lean in standing.   Pt supine in bed with all needs within reach and bed alarm on.      [facility] [place], [place], DPT  09/09/2023, 4:20 PM

## 2023-09-09 NOTE — Progress Notes (Signed)
Inpatient Rehabilitation Center Individual Statement of Services  Patient Name:  James [name].  Date:  09/09/2023  Welcome to the Inpatient Rehabilitation Center.  Our goal is to provide you with an individualized program based on your diagnosis and situation, designed to meet your specific needs.  With this comprehensive rehabilitation program, you will be expected to participate in at least 3 hours of rehabilitation therapies Monday-Friday, with modified therapy programming on the weekends.  Your rehabilitation program will include the following services:  Physical Therapy (PT), Occupational Therapy (OT), Speech Therapy (ST), 24 hour per day rehabilitation nursing, Therapeutic Recreaction (TR), Neuropsychology, Care Coordinator, Rehabilitation Medicine, Nutrition Services, and Pharmacy Services  Weekly team conferences will be held on Wednesday to discuss your progress.  Your Inpatient Rehabilitation Care Coordinator will talk with you frequently to get your input and to update you on team discussions.  Team conferences with you and your family in attendance may also be held.  Expected length of stay: 7-10 days  Overall anticipated outcome: supervision with cueing  Depending on your progress and recovery, your program may change. Your Inpatient Rehabilitation Care Coordinator will coordinate services and will keep you informed of any changes. Your Inpatient Rehabilitation Care Coordinator's name and contact numbers are listed  below.  The following services may also be recommended but are not provided by the Inpatient Rehabilitation Center:  Driving Evaluations Home Health Rehabiltiation Services Outpatient Rehabilitation Services    Arrangements will be made to provide these services after discharge if needed.  Arrangements include referral to agencies that provide these services.  Your insurance has been verified to be:  [employer]_Medicare Your primary doctor is:  Aletha Halim  Pertinent information will be shared with your doctor and your insurance company.  Inpatient Rehabilitation Care Coordinator:  Dossie Der, [place] [phone] or Luna Glasgow  Information discussed with and copy given to patient by: Lucy Chris, 09/09/2023, 2:39 PM

## 2023-09-09 NOTE — Plan of Care (Signed)
  Problem: RH Balance Goal: LTG Patient will maintain dynamic sitting balance (PT) Description: LTG:  Patient will maintain dynamic sitting balance with assistance during mobility activities (PT) Flowsheets (Taken 09/09/2023 1642) LTG: Pt will maintain dynamic sitting balance during mobility activities with:: Supervision/Verbal cueing Goal: LTG Patient will maintain dynamic standing balance (PT) Description: LTG:  Patient will maintain dynamic standing balance with assistance during mobility activities (PT) Flowsheets (Taken 09/09/2023 1642) LTG: Pt will maintain dynamic standing balance during mobility activities with:: Supervision/Verbal cueing   Problem: Sit to Stand Goal: LTG:  Patient will perform sit to stand with assistance level (PT) Description: LTG:  Patient will perform sit to stand with assistance level (PT) Flowsheets (Taken 09/09/2023 1642) LTG: PT will perform sit to stand in preparation for functional mobility with assistance level: Supervision/Verbal cueing   Problem: RH Bed Mobility Goal: LTG Patient will perform bed mobility with assist (PT) Description: LTG: Patient will perform bed mobility with assistance, with/without cues (PT). Flowsheets (Taken 09/09/2023 1642) LTG: Pt will perform bed mobility with assistance level of: Independent with assistive device    Problem: RH Bed to Chair Transfers Goal: LTG Patient will perform bed/chair transfers w/assist (PT) Description: LTG: Patient will perform bed to chair transfers with assistance (PT). Flowsheets (Taken 09/09/2023 1642) LTG: Pt will perform Bed to Chair Transfers with assistance level: Supervision/Verbal cueing   Problem: RH Car Transfers Goal: LTG Patient will perform car transfers with assist (PT) Description: LTG: Patient will perform car transfers with assistance (PT). Flowsheets (Taken 09/09/2023 1642) LTG: Pt will perform car transfers with assist:: Supervision/Verbal cueing   Problem: RH Ambulation Goal:  LTG Patient will ambulate in controlled environment (PT) Description: LTG: Patient will ambulate in a controlled environment, # of feet with assistance (PT). Flowsheets (Taken 09/09/2023 1642) LTG: Pt will ambulate in controlled environ  assist needed:: Supervision/Verbal cueing LTG: Ambulation distance in controlled environment: 150 feet with LRAD Goal: LTG Patient will ambulate in home environment (PT) Description: LTG: Patient will ambulate in home environment, # of feet with assistance (PT). Flowsheets (Taken 09/09/2023 1642) LTG: Pt will ambulate in home environ  assist needed:: Supervision/Verbal cueing LTG: Ambulation distance in home environment: 75 feet with LRAD

## 2023-09-09 NOTE — Plan of Care (Signed)
  Problem: Consults Goal: RH STROKE PATIENT EDUCATION Description: See Patient Education module for education specifics  Outcome: Progressing   Problem: RH BOWEL ELIMINATION Goal: RH STG MANAGE BOWEL WITH ASSISTANCE Description: STG Manage Bowel with toileting Assistance. Outcome: Progressing Goal: RH STG MANAGE BOWEL W/MEDICATION W/ASSISTANCE Description: STG Manage Bowel with Medication with mod I Assistance. Outcome: Progressing   Problem: RH BLADDER ELIMINATION Goal: RH STG MANAGE BLADDER WITH ASSISTANCE Description: STG Manage Bladder With toileting Assistance Outcome: Progressing Goal: RH STG MANAGE BLADDER WITH MEDICATION WITH ASSISTANCE Description: STG Manage Bladder With Medication With mod I  Assistance. Outcome: Progressing   Problem: RH SAFETY Goal: RH STG ADHERE TO SAFETY PRECAUTIONS W/ASSISTANCE/DEVICE Description: STG Adhere to Safety Precautions With cues Assistance/Device. Outcome: Progressing   Problem: RH KNOWLEDGE DEFICIT Goal: RH STG INCREASE KNOWLEDGE OF HYPERTENSION Description: Patient and family will be able to manage HTN with medications and dietary modifications using educational resources independently Outcome: Progressing Goal: RH STG INCREASE KNOWLEGDE OF HYPERLIPIDEMIA Description: Patient and family will be able to manage HLD with medications and dietary modifications using educational resources independently Outcome: Progressing Goal: RH STG INCREASE KNOWLEDGE OF STROKE PROPHYLAXIS Description: Patient and family will be able to manage secondary risks with medications and dietary modifications using educational resources independently Outcome: Progressing

## 2023-09-09 NOTE — Evaluation (Signed)
Speech Language Pathology Assessment and Plan  Patient Details  Name: James [name]. MRN: [id number] Date of Birth: [birth year]  SLP Diagnosis: Cognitive Impairments;Dysphagia  Rehab Potential: Excellent ELOS: ~10 days    Today's Date: 09/09/2023 SLP Individual Time: 0800-0900 SLP Individual Time Calculation (min): 60 min   Hospital Problem: Principal Problem:   Right thalamic infarction [facility])  Past Medical History:  Past Medical History:  Diagnosis Date   Anxiety    TIA (transient ischemic attack)    Past Surgical History:  Past Surgical History:  Procedure Laterality Date   HAND SURGERY Left    HEMORRHOID SURGERY     TONSILLECTOMY AND ADENOIDECTOMY      Assessment / Plan / Recommendation Clinical Impression HPI: Pt is an 89 year old male with medical hx significant for: anxiety, prior TIA, HTN, hyperlipidemia, h/o lacunar infarction with dysarthria and disequilibrium (08/14/23), polyneuropathy, mild cognitive impairment. Pt [facility] to [facility] on 09/[facility]/23 d/t left arm weakness and numbness in left hand. Pt not a candidate for TNK. CT head negative for acute abnormalities. MRI revealed right thalamic capsular infarct and two punctate acute infarcts in the right occipital lobe and possible small subacute infarct in left frontal white matter with chronic microvascular ischemic changes. 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   Clinical Impression:  Bedside Swallow Evaluation: Patient presents with s/sx of oropharyngeal dysphagia. Oral mechanism revealed reduced L facial ROM and symmetry, however oral ROM/strength WFL. POs administered included thin liquids, nectar thick liquids, purees, and solids. Patient with immediate throat clear and wet VC after thin liquids indicative of possible bolus  misdirection. Patient consumed remainder of consistencies with no s/sx. Recommend D3/NTL diet with water protocol. Patient should consume NTL w/ meals and may be offered thin liquids after oral care between mealtimes. Recommend intermittent supervision and medications administered whole in puree.  Communication: No expressive/receptive language deficits present.  Cognition: Patient was administered the Cognistat to assess cognitive-linguistic functioning. Patient scored WFL on all subtest's with the exception of moderate impairments during completion of registration and visual construction tasks. These impairments are indicative of STM and problem solving deficits. Of note, patient benefited from increased processing time especially during problem solving tasks.   Pt would benefit from skilled ST services to maximize cognition and swallowing safety in order to maximize functional independence at d/c. Anticipate patient will require 24 hour supervision at d/c and f/u SLP services.    Skilled Therapeutic Interventions          Patient evaluated using a standardized cognitive linguistic assessment and bedside swallow evaluation to assess current cognitive, communicative and swallowing function. See above for details.   SLP Assessment  Patient will need skilled Speech Lanaguage Pathology Services during CIR admission    Recommendations  SLP Diet Recommendations: Dysphagia 3 (Mech soft);Nectar (thin via water protocol) Liquid Administration via: Cup;No straw Medication Administration: Whole meds with puree Supervision: Patient able to self feed;Intermittent supervision to cue for compensatory strategies Compensations: Minimize environmental distractions;Slow rate;Small sips/bites Postural Changes and/or Swallow Maneuvers: Out of bed for meals;Seated upright 90 degrees;Upright 30-60 min after meal Oral Care Recommendations: Oral care BID Patient destination: Home Follow up Recommendations: Outpatient  SLP;Home Health SLP Equipment Recommended: None recommended by SLP    SLP Frequency 1 to 3 out of 7 days   SLP Duration  SLP Intensity  SLP Treatment/Interventions ~10 days  Minumum of 1-2 x/day, 30 to 90 minutes  Cognitive remediation/compensation;Environmental controls;Internal/external aids;Therapeutic Exercise;Cueing hierarchy;Dysphagia/aspiration precaution training;Functional tasks;Patient/family education;Therapeutic Activities    Pain Pain Assessment Pain Scale: 0-10 Pain Score: 0-No pain  Prior Functioning Type of Home: House  Lives With: Spouse Available Help at Discharge: Family;Available 24 hours/day Vocation: Retired  Architectural technologist Overall Cognitive Status: Impaired/Different from baseline Arousal/Alertness: Awake/alert Orientation Level: Oriented X4 Year: 2024 Month: September Day of Week: Correct Attention: Focused;Sustained Focused Attention: Appears intact Sustained Attention: Appears intact Memory: Impaired Memory Impairment: Decreased recall of new information;Decreased short term memory Decreased Short Term Memory: Verbal complex;Functional complex Awareness: Appears intact Problem Solving: Impaired Problem Solving Impairment: Verbal complex;Functional complex  Comprehension Auditory Comprehension Overall Auditory Comprehension: Appears within functional limits for tasks assessed Expression Expression Primary Mode of Expression: Verbal Verbal Expression Overall Verbal Expression: Appears within functional limits for tasks assessed Written Expression Dominant Hand: Right Oral Motor Oral Motor/Sensory Function Overall Oral Motor/Sensory Function: Within functional limits Facial ROM: Reduced left Facial Symmetry: Abnormal symmetry left Facial Strength: Within Functional Limits Lingual ROM: Within Functional Limits Lingual Symmetry: Within Functional Limits Lingual Strength: Within Functional Limits Lingual Sensation: Within  Functional Limits Velum: Within Functional Limits Mandible: Within Functional Limits Motor Speech Overall Motor Speech: Appears within functional limits for tasks assessed  Care Tool Care Tool Cognition Ability to hear (with hearing aid or hearing appliances if normally used Ability to hear (with hearing aid or hearing appliances if normally used): 1. Minimal difficulty - difficulty in some environments (e.g. when person speaks softly or setting is noisy)   Expression of Ideas and Wants Expression of Ideas and Wants: 4. Without difficulty (complex and basic) - expresses complex messages without difficulty and with speech that is clear and easy to understand   Understanding Verbal and Non-Verbal Content Understanding Verbal and Non-Verbal Content: 4. Understands (complex and basic) - clear comprehension without cues or repetitions  Memory/Recall Ability Memory/Recall Ability : Current season;That he or she is in a hospital/hospital unit    Bedside Swallowing Assessment General Date of Onset: 09/07/23 Diet Prior to this Study: Dysphagia 3 (mechanical soft);Thin liquids (Level 0) Respiratory Status: Room air Behavior/Cognition: Alert;Cooperative;Pleasant mood Oral Cavity - Dentition: Adequate natural dentition;Missing dentition Self-Feeding Abilities: Able to feed self;Needs set up Vision: Functional for self-feeding Patient Positioning: Upright in bed Baseline Vocal Quality: Normal Volitional Cough: Strong Volitional Swallow: Able to elicit  Ice Chips Ice chips: Not tested Thin Liquid Thin Liquid: Impaired Presentation: Cup;Self Fed;Straw Pharyngeal  Phase Impairments: Suspected delayed Swallow;Wet Vocal Quality;Throat Clearing - Immediate Nectar Thick Nectar Thick Liquid: Within functional limits Honey Thick Honey Thick Liquid: Not tested Puree Puree: Within functional limits Presentation: Self Fed;Spoon Solid Solid: Within functional limits Presentation: Self Fed BSE  Assessment Risk for Aspiration Impact on safety and function: Mild aspiration risk Other Related Risk Factors: Previous CVA  Short Term Goals: Week 1: SLP Short Term Goal 1 (Week 1): Patient will follow swallowing strategies during consumption of least restrictive diet with supervision multimodal A SLP Short Term Goal 2 (Week 1): Patient will demonstrate ability to problem solve in mildly complex functional situations with mod multimodal A SLP Short Term Goal 3 (Week 1): Patient will recall and utilize memory compensatory strategies with mod multimodalA  Refer to Care Plan for Long Term Goals  Recommendations for other services: None   Discharge Criteria: Patient will be discharged from SLP if patient refuses treatment 3 consecutive times without medical reason, if treatment  goals not met, if there is a change in medical status, if patient makes no progress towards goals or if patient is discharged from hospital.  The above assessment, treatment plan, treatment alternatives and goals were discussed and mutually agreed upon: by patient  Yashas Camilli M.A., CF-SLP 09/09/2023, 10:29 AM

## 2023-09-09 NOTE — Progress Notes (Signed)
Patient ID: James [name] Current., male   DOB: 1934-07-20, 89 y.o.   MRN: [id number] Met with the patient to review current situation, rehab program, team conference and plan of care. Discussed secondary risks including HLD and ? A-fib with Zio Patch thru 09/22/23. Patient reported fatigue and not sleeping well despite medication for insomnia. Takes Xanax 1.0 mg at home PTA at HS.  Reviewed diet and medications including DAPT x 3 months then ASA solo per MD. Also reviewed PAD and AAA and wound care.  Continue to follow along to address educational needs to facilitate preparation for discharge. [name]

## 2023-09-10 DIAGNOSIS — K5900 Constipation, unspecified: Secondary | ICD-10-CM | POA: Diagnosis not present

## 2023-09-10 DIAGNOSIS — F419 Anxiety disorder, unspecified: Secondary | ICD-10-CM

## 2023-09-10 DIAGNOSIS — I6381 Other cerebral infarction due to occlusion or stenosis of small artery: Secondary | ICD-10-CM | POA: Diagnosis not present

## 2023-09-10 DIAGNOSIS — R5383 Other fatigue: Secondary | ICD-10-CM

## 2023-09-10 DIAGNOSIS — G4700 Insomnia, unspecified: Secondary | ICD-10-CM | POA: Diagnosis not present

## 2023-09-10 DIAGNOSIS — N401 Enlarged prostate with lower urinary tract symptoms: Secondary | ICD-10-CM | POA: Diagnosis not present

## 2023-09-10 DIAGNOSIS — R5381 Other malaise: Secondary | ICD-10-CM

## 2023-09-10 LAB — SARS CORONAVIRUS 2 BY RT PCR: SARS Coronavirus 2 by RT PCR: NEGATIVE

## 2023-09-10 MED ORDER — MELATONIN 3 MG PO TABS
3.0000 mg | ORAL_TABLET | Freq: Every day | ORAL | Status: DC
  Administered 2023-09-10 – 2023-09-19 (×10): 3 mg via ORAL
  Filled 2023-09-10 (×10): qty 1

## 2023-09-10 NOTE — Progress Notes (Signed)
PROGRESS NOTE   Subjective/Complaints: Patient reports poor sleep last night.  Feels fatigued and tired today.  ROS: Denies fever, chills, shortness of breath, chest pain, abdominal pain, nausea, vomiting, rash + Fatigue and malaise Objective:   No results found. Recent Labs    09/09/23 0748  WBC 5.3  HGB 13.8  HCT 42.8  PLT 182   Recent Labs    09/09/23 0748  NA 139  K 3.8  CL 104  CO2 28  GLUCOSE 141*  BUN 24*  CREATININE 1.22  CALCIUM 9.2    Intake/Output Summary (Last 24 hours) at 09/10/2023 1719 Last data filed at 09/10/2023 1448 Gross per 24 hour  Intake 720 ml  Output --  Net 720 ml        Physical Exam: Vital Signs Blood pressure 110/65, pulse 95, temperature 98.9 F (37.2 C), resp. rate 19, weight 68.7 kg, SpO2 99%.   General: No apparent distress, appears fatigued, working with therapy in the gym HEENT: Head is normocephalic, atraumatic, hard of hearing, mucous membranes dry Neck: Supple without JVD or lymphadenopathy Heart: Reg rate and rhythm. Chest: CTA bilaterally, no increased work of breathing Abdomen: Soft, non-tender, non-distended, bowel sounds positive. Extremities: No clubbing, cyanosis, or edema. Pulses are 2+ Psych: Pt's affect is appropriate. Pt is cooperative Skin: Area of reddened skin left AC fossa from prior IV infiltration-improved Neuro: Alert and oriented to name, month, hospital.  Facial asymmetry present.  Facial sensation intact.  Mildly dysarthric speech. Decreased sensation to light touch left upper extremity left lower extremity RUE- 5/5 throughout LUE- 4+/5 but cannot make fist- of note, fingers on L hand smaller than R hand- smaller circumference RLE- 5/5 throughout LLE- HF 4/5; KE 4+/5; DF/PF 5-/5  Musculoskeletal: No joint swelling noted   Assessment/Plan: 1. Functional deficits which require 3+ hours per day of interdisciplinary therapy in a comprehensive  inpatient rehab setting. Physiatrist is providing close team supervision and 24 hour management of active medical problems listed below. Physiatrist and rehab team continue to assess barriers to discharge/monitor patient progress toward functional and medical goals  Care Tool:  Bathing    Body parts bathed by patient: Right arm, Left arm, Chest, Abdomen, Front perineal area, Buttocks, Right upper leg, Left upper leg, Face   Body parts bathed by helper: Left lower leg, Right lower leg     Bathing assist Assist Level: Minimal Assistance - Patient > 75%     Upper Body Dressing/Undressing Upper body dressing   What is the patient wearing?: Pull over shirt    Upper body assist Assist Level: Minimal Assistance - Patient > 75%    Lower Body Dressing/Undressing Lower body dressing      What is the patient wearing?: Incontinence brief, Pants     Lower body assist Assist for lower body dressing: Moderate Assistance - Patient 50 - 74%     Toileting Toileting    Toileting assist Assist for toileting: Minimal Assistance - Patient > 75%     Transfers Chair/bed transfer  Transfers assist     Chair/bed transfer assist level: 2 Helpers (+2 for B HHA with no AD (for pt comfort 2/2 FOF))  Locomotion Ambulation   Ambulation assist      Assist level: 2 helpers (+2 for B HHA with no AD (for pt comfort 2/2 FOF)) Assistive device: No Device (25 feet with no AD and B HHA, 125 feet with RW and min A) Max distance: 125   Walk 10 feet activity   Assist     Assist level: 2 helpers Assistive device: No Device   Walk 50 feet activity   Assist    Assist level: Minimal Assistance - Patient > 75% Assistive device: Walker-rolling    Walk 150 feet activity   Assist Walk 150 feet activity did not occur: Safety/medical concerns         Walk 10 feet on uneven surface  activity   Assist     Assist level: Minimal Assistance - Patient > 75% Assistive device:  Walker-rolling   Wheelchair     Assist Is the patient using a wheelchair?: Yes Type of Wheelchair: Manual    Wheelchair assist level: Total Assistance - Patient < 25%      Wheelchair 50 feet with 2 turns activity    Assist        Assist Level: Total Assistance - Patient < 25%   Wheelchair 150 feet activity     Assist      Assist Level: Total Assistance - Patient < 25%   Blood pressure 110/65, pulse 95, temperature 98.9 F (37.2 C), resp. rate 19, weight 68.7 kg, SpO2 99%.  Medical Problem List and Plan: 1. Functional deficits secondary to right thalamic capsular infarct and 2 punctate infarcts in the right occipital lobe with a possible small subacute infarct in the left frontal white matter with chronic microvascular ischemic changes             -patient may  shower             -ELOS/Goals: 10-14 days, supervision PT/OT/ST  -Continue CIR  2.  Antithrombotics: -DVT/anticoagulation:  Pharmaceutical: Lovenox             -antiplatelet therapy: Aspirin 81 mg daily and Plavix 75 mg daily x 3 months then aspirin alone 3. Pain Management: Tylenol as needed 4. Mood/Behavior/Sleep: Trazodone 100 mg nightly, Xanax 1 mg twice daily as needed             -antipsychotic agents: N/A  -9/26 Add melatonin for insomnia, does not appear he took his home Xanax last night for anxiety..  Will ask nursing to make sure he knows he can get this if needed. 5. Neuropsych/cognition: This patient is capable of making decisions on his own behalf. 6. Skin/Wound Care: Routine skin checks 7. Fluids/Electrolytes/Nutrition: Routine in and outs with follow-up chemistries 8.  BPH.  Proscar 5 mg daily/Flomax 0.4 mg daily  -9/25 continent of bladder for past several voids, continue to monitor  -9/26 remains continent 9.  Hyperlipidemia.  Lipitor 10.  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  Follow-up outpatient CT chest CTA 11. IV infiltration on L AC fossa- warm  compresses  -9/25 monitor for improvement 12.  Constipation.   -9/25 Improved with bowel movement yesterday.  Continue to monitor bowel function  9/26 last BM 2/24 continue to monitor 13.  Malaise/fatigue.  COVID-19 test checked and negative LOS: 2 days A FACE TO FACE EVALUATION WAS PERFORMED  Fanny Dance 09/10/2023, 5:19 PM

## 2023-09-10 NOTE — Progress Notes (Signed)
Occupational Therapy Session Note  Patient Details  Name: James [name]. MRN: [id number] Date of Birth: 1933-12-24  Today's Date: 09/10/2023 OT Individual Time: 4098-1191 OT Individual Time Calculation (min): 60 min    Short Term Goals: Week 1:  OT Short Term Goal 1 (Week 1): Pt will complete LB dressing with Min A while utilizing both UE's equally throughout activity. OT Short Term Goal 2 (Week 1): Pt will demonstrate improved left UE awareness while participating in self care task requiring Min VC as needed.  Skilled Therapeutic Interventions/Progress Updates:   Pt seen in room for skilled OT services., Just went on precaution for r/o COVID thus OT donned appropriate PPE and treated bedside. Educated on memory strategies for RO and steps of functional tasks as pt reports using his phone frequently and may be able to integrate Notes section for this as well as calendar feature. Pt able to complete oral care, face washing and hair combing EOB with CGA. Yellow tband 10 reps x 3 sets B triceps press with rest between sets. Requested back to supine at end of session with [facility] elevated, needs, nurse call button and bed alarm set.   Pain: feet burning with un-rated pain d/t neuropathy; educated and provided heel float option with relief reported   Therapy Documentation Precautions:  Precautions Precautions: Fall, Other (comment) Precaution Comments: Zio patch, DNR Restrictions Weight Bearing Restrictions: No   Therapy/Group: Individual Therapy  Vicenta Dunning 09/10/2023, 7:34 AM

## 2023-09-10 NOTE — Progress Notes (Signed)
Speech Language Pathology Daily Session Note  Patient Details  Name: James [name]. MRN: [id number] Date of Birth: 1934-09-06  Today's Date: 09/10/2023 SLP Individual Time: 1400-1500 SLP Individual Time Calculation (min): 60 min  Short Term Goals: Week 1: SLP Short Term Goal 1 (Week 1): Patient will follow swallowing strategies during consumption of least restrictive diet with supervision multimodal A SLP Short Term Goal 2 (Week 1): Patient will demonstrate ability to problem solve in mildly complex functional situations with mod multimodal A SLP Short Term Goal 3 (Week 1): Patient will recall and utilize memory compensatory strategies with mod multimodalA  Skilled Therapeutic Interventions: Skilled SLP session focused on dysphagia and cognitive goals. SLP faciliated session by observing thin liquid via 10cc provale and NTL trials. Patient with no s/sx of aspiration during NTL, however x1 delayed cough after sip from provale. Of note, patient with complaints of GERD throughout session. Recommend contination of D3 diet, though upgrade to offering thin liquids via provale OR NTL. Medications administered whole in puree. Intermittent supervision. SLP addressed cognition through education regarding memory strategies. SLP encouraged patient to recall examples of use of each memory strategy and utilize them to recall care team member names. Patient completed task with minA. Patient left in chair with alarm set and call bell in reach. Continue POC.   Pain No reports of pain  Therapy/Group: Individual Therapy  Amandamarie Feggins M.A., CF-SLP 09/10/2023, 3:52 PM

## 2023-09-10 NOTE — Progress Notes (Signed)
   09/10/23 1315  Assess: MEWS Score  Temp 98.2 F (36.8 C)  BP 93/80  MAP (mmHg) 86  Pulse Rate (!) 102  Resp 19  Level of Consciousness Alert  SpO2 97 %  O2 Device Room Air  Assess: MEWS Score  MEWS Temp 0  MEWS Systolic 1  MEWS Pulse 1  MEWS RR 0  MEWS LOC 0  MEWS Score 2  MEWS Score Color Yellow  Assess: if the MEWS score is Yellow or Red  Were vital signs accurate and taken at a resting state? Yes  Does the patient meet 2 or more of the SIRS criteria? No  MEWS guidelines implemented  Yes, yellow  Treat  MEWS Interventions Considered administering scheduled or prn medications/treatments as ordered  Take Vital Signs  Increase Vital Sign Frequency  Yellow: Q2hr x1, continue Q4hrs until patient remains green for 12hrs  Escalate  MEWS: Escalate Yellow: Discuss with charge nurse and consider notifying provider and/or RRT  Notify: Charge Nurse/RN  Name of Charge Nurse/RN Notified Stacy RN  Provider Notification  Provider Name/Title Camillia Herter  Date Provider Notified 09/10/23  Time Provider Notified 1327  Method of Notification Call  Notification Reason Other (Comment) (yellow mews)  Provider response No new orders  Date of Provider Response 09/10/23  Time of Provider Response 1327  Assess: SIRS CRITERIA  SIRS Temperature  0  SIRS Pulse 1  SIRS Respirations  0  SIRS WBC 0  SIRS Score Sum  1

## 2023-09-10 NOTE — Progress Notes (Addendum)
Physical Therapy Session Note  Patient Details  Name: James [name]. MRN: [id number] Date of Birth: 10-19-1934  Today's Date: 09/10/2023 PT Individual Time: 5277-8242, 3536-1443 PT Individual Time Calculation (min): 47 min, 32 min  and Today's Date: 09/10/2023 PT Missed Time: 13 Minutes Missed Time Reason: Patient fatigue;Patient ill (Comment)  Short Term Goals: Week 1:  PT Short Term Goal 1 (Week 1): STG=LTG 2/2 ELOS  Skilled Therapeutic Interventions/Progress Updates:      Treatment Session 1  Pt supine in bed upon arrival. Pt agreeable to therapy. Pt reports B foot ache, pt reports when the numbness goes away it is painful, pt reports "it is more of an ache than a pain". Therapist provided seated rest breaks and repositioning as needed for pain. Pt does not recall therapy sessions yesterday. Pt oriented x3, pt disoriented to location.   Vitals Assessed: Seated EOB BP 133/80, HR 104, post stand pivot transfer 121/76 post stand pivot transfer.   Supine to sit with HOB elevated and use of R bed rail with supervision and increased time. Pt reports feeling groggy Pt demos L lateral and forward trunk lean while seated EOB, pt able to correct with min A but has diffculty sustaining 2/2 fatigue/weakness. Pt reports he didn't get much sleep last night and feeling groggy. Notified nurse and MD during daily rounds.   Stand pivot transfer elevated bed to WC with RW and min A, verbal cues provided for safety with RW.Stand pivot transfer WC to mat table with RW and mod A for management of RW and increased weight shift anteriorly with heavy UE support on RW.   Pt completed following activities while seated EOM for B LE strengthening and static sitting balance, 1x10 B LAQ, 1x10 active assisted seated marching, 1x10 alternating reaching arm above head, verbal and tactile cues provided for maintenance of UE posture, pt able to maintain with sup/min A. Pt requesting to return to bed.   Stand step  transfer mat table to WC, and WC to bed with RW and min-mod A, verbal cues provided for safety with RW and upright posture prior to stepping.   Pt supine in bed with bed alarm on and all needs within reach at end of session. Pt missed 13 minutes 2/2 fatigue/not feeling well.   Treatment Session 2   Pt supine in bed upon arrival. Pt agreeable to therapy. Pt denies any pain. Pt able to recall that he tested negative for Covid.   Tech in room to assess vitals: BP 93/80 HR 120, donned ted hose with total A while seated EOB  BP 135/79 HR 99 at end of session.   Pt incontinent of bladder. Pt performed sit to stand x3 throughout session with RW and min A for power up, verbal cues provided for scooting forward, L UE on RW, and R UE on bed, and upright posture. Pt stood with RW and CGA while pt donned pants over buttocks. Pt required min A for donning shoes and threading pants through legs.   Stand pivot transfer bed to recliner with RW and CGA/min A, verbal cues provided for safety with RW and use of R UE for controlled descent.   Pt seated in recliner at end of session with all needs within reach and seatbelt alarm on.       Therapy Documentation Precautions:  Precautions Precautions: Fall, Other (comment) Precaution Comments: Zio patch, DNR Restrictions Weight Bearing Restrictions: No  Therapy/Group: Individual Therapy  Ambrose Finland Ambrose Finland, PT, DPT  09/10/2023, 7:46 AM

## 2023-09-11 DIAGNOSIS — K5900 Constipation, unspecified: Secondary | ICD-10-CM | POA: Diagnosis not present

## 2023-09-11 DIAGNOSIS — N401 Enlarged prostate with lower urinary tract symptoms: Secondary | ICD-10-CM | POA: Diagnosis not present

## 2023-09-11 DIAGNOSIS — I6381 Other cerebral infarction due to occlusion or stenosis of small artery: Secondary | ICD-10-CM | POA: Diagnosis not present

## 2023-09-11 DIAGNOSIS — G4700 Insomnia, unspecified: Secondary | ICD-10-CM | POA: Diagnosis not present

## 2023-09-11 MED ORDER — SENNOSIDES-DOCUSATE SODIUM 8.6-50 MG PO TABS
1.0000 | ORAL_TABLET | Freq: Every day | ORAL | Status: DC
  Administered 2023-09-11 – 2023-09-17 (×7): 1 via ORAL
  Filled 2023-09-11 (×7): qty 1

## 2023-09-11 MED ORDER — POLYETHYLENE GLYCOL 3350 17 G PO PACK
17.0000 g | PACK | Freq: Two times a day (BID) | ORAL | Status: DC
  Administered 2023-09-11 – 2023-09-20 (×18): 17 g via ORAL
  Filled 2023-09-11 (×18): qty 1

## 2023-09-11 NOTE — Progress Notes (Signed)
Occupational Therapy Session Note  Patient Details  Name: James [name]. MRN: [id number] Date of Birth: 1934-02-28  Today's Date: 09/11/2023 OT Individual Time: 7829-5621 OT Individual Time Calculation (min): 75 min    Short Term Goals: Week 1:  OT Short Term Goal 1 (Week 1): Pt will complete LB dressing with Min A while utilizing both UE's equally throughout activity. OT Short Term Goal 2 (Week 1): Pt will demonstrate improved left UE awareness while participating in self care task requiring Min VC as needed.  Skilled Therapeutic Interventions/Progress Updates:   Pt just got into bed upon OT arrival with NT. Agreeable to 1st shower this session after brief rest. OT educated on energy conservation and task simplification premises. Pt moved to EOB  RW with close S and min cues to w/c via SPT with CGA. OT moved w/c to bathroom stall. SPT with grab bars and CGA to/from TTB. Covered L chest Zio patch for waterproofing. Issued and trained in [place] sponge for LB reach. UB bathing and pull over shirt with close S. LB bathing with min A with sponge and grab bar for peri and buttocks region but dressing with Depends pull up and pull on pants and socks with mod-min A. Button up shirt with increased time and effort with close S. Amb back to bed at end of session from TTB using RW with min A/CGA. Bed alarm set, needs and nurse call button in reach.   Pain: denied any pain   Therapy Documentation Precautions:  Precautions Precautions: Fall, Other (comment) Precaution Comments: Zio patch, DNR Restrictions Weight Bearing Restrictions: No   Therapy/Group: Individual Therapy  Vicenta Dunning 09/11/2023, 7:53 AM

## 2023-09-11 NOTE — Progress Notes (Signed)
Physical Therapy Session Note  Patient Details  Name: James [name]. MRN: [id number] Date of Birth: [birth year]  Today's Date: 09/11/2023 PT Individual Time: 0950-1047 PT Individual Time Calculation (min): 57 min   Short Term Goals: Week 1:  PT Short Term Goal 1 (Week 1): STG=LTG 2/2 ELOS  Skilled Therapeutic Interventions/Progress Updates: Patient sitting in recliner on entrance to room. Patient alert and agreeable to PT session.   Patient reported no pain at beginning of PT session. Today's session focused on updating outcome measure of BERG from when pt was in acute.  In the middle of BERG, pt reported tightness on B lateral thighs (when WB). Pt transported room<>main gym dependently for energy conservation.   Therapeutic Activity: Transfers: Pt performed sit<>stand transfers throughout session with CGA/close supervision for safety. Provided VC for pt to keep RW in safe proximity per presentation of pt having it more than 1 foot away from BOS with B UE on RW (pt educated on how that is unsafe with demonstration provided by PTA to show how WB on RW changes from when it is far away, to close to B LE's.  Patient demonstrates increased fall risk as noted by score of   30/56 on Berg Balance Scale (increase from 17/56 from when pt was in acute).  (<36= high risk for falls, close to 100%; 37-45 significant >80%; 46-51 moderate >50%; 52-55 lower >25%)  Therapeutic Exercise: Pt performed the following exercises with therapist providing the described cuing and facilitation for improvement. - Pt ambulated from edge of mat to // bars (roughly 25') with close supervision and in RW. Attempted standing hip abductor stretch in standing while pt inside // bars. Pt provided with demo and instructive cues on body mechanics (B UE support on // bar facing mirror). Pt cued to cross R LE over L LE, and to laterally lengthen L hip to reach PTA's hand as visual target. Pt required increased time to  understand sequence. Pt reported feeling like L hip is "popping." Hip abductor stretch deferred with PTA palpating area and pt with no reports of pain or the like. Stretching ceased until PTA is able to reach out to attending PT to assess better hip abductor stretch due to time constraint during this session.   Patient sitting in recliner at end of session with brakes locked, belt alarm set, and all needs within reach.      Therapy Documentation Precautions:  Precautions Precautions: Fall, Other (comment) Precaution Comments: Zio patch, DNR Restrictions Weight Bearing Restrictions: No  Therapy/Group: Individual Therapy  Ferlando Lia PTA 09/11/2023, 12:30 PM

## 2023-09-11 NOTE — Progress Notes (Signed)
PROGRESS NOTE   Subjective/Complaints: No acute events overnight noted.  Patient still reports he feels a little fatigued.  He is unable to tell me how well he slept last night.  ROS: Denies fever, chills, shortness of breath, chest pain, abdominal pain, nausea, vomiting, rash, headache + Fatigue and malaise Objective:   No results found. Recent Labs    09/09/23 0748  WBC 5.3  HGB 13.8  HCT 42.8  PLT 182   Recent Labs    09/09/23 0748  NA 139  K 3.8  CL 104  CO2 28  GLUCOSE 141*  BUN 24*  CREATININE 1.22  CALCIUM 9.2    Intake/Output Summary (Last 24 hours) at 09/11/2023 1516 Last data filed at 09/11/2023 1506 Gross per 24 hour  Intake 360 ml  Output 225 ml  Net 135 ml        Physical Exam: Vital Signs Blood pressure 99/68, pulse 96, temperature 98.6 F (37 C), resp. rate 16, weight 68.7 kg, SpO2 98%.   General: No apparent distress, lying in bed appears comfortable HEENT: Head is normocephalic, atraumatic, hard of hearing, MMM Neck: Supple without JVD or lymphadenopathy Heart: Reg rate and rhythm. Chest: CTA bilaterally, no increased work of breathing Abdomen: Soft, non-tender, non-distended, bowel sounds positive. Extremities: No clubbing, cyanosis, or edema. Pulses are 2+ Psych: Pt's affect is appropriate. Pt is cooperative Skin: Area of reddened skin left AC fossa from prior IV infiltration-improved Neuro: Appears more alert than yesterday. Facial asymmetry present.  Facial sensation intact.  Mildly dysarthric speech.  Memory deficits present Decreased sensation to light touch left upper extremity left lower extremity RUE- 5/5 throughout LUE- 4+/5 but cannot make fist- of note, fingers on L hand smaller than R hand- smaller circumference RLE- 5/5 throughout LLE- HF 4/5; KE 4+/5; DF/PF 5-/5  Musculoskeletal: No joint swelling noted   Assessment/Plan: 1. Functional deficits which require 3+  hours per day of interdisciplinary therapy in a comprehensive inpatient rehab setting. Physiatrist is providing close team supervision and 24 hour management of active medical problems listed below. Physiatrist and rehab team continue to assess barriers to discharge/monitor patient progress toward functional and medical goals  Care Tool:  Bathing    Body parts bathed by patient: Right arm, Left arm, Chest, Abdomen, Front perineal area, Buttocks, Right upper leg, Left lower leg, Face   Body parts bathed by helper: Right lower leg, Left lower leg     Bathing assist Assist Level: Minimal Assistance - Patient > 75%     Upper Body Dressing/Undressing Upper body dressing   What is the patient wearing?: Pull over shirt    Upper body assist Assist Level: Set up assist    Lower Body Dressing/Undressing Lower body dressing      What is the patient wearing?: Pants, Incontinence brief     Lower body assist Assist for lower body dressing: Minimal Assistance - Patient > 75%     Toileting Toileting    Toileting assist Assist for toileting: Minimal Assistance - Patient > 75%     Transfers Chair/bed transfer  Transfers assist     Chair/bed transfer assist level: 2 Helpers (+2 for B HHA with no  AD (for pt comfort 2/2 FOF))     Locomotion Ambulation   Ambulation assist      Assist level: 2 helpers (+2 for B HHA with no AD (for pt comfort 2/2 FOF)) Assistive device: No Device (25 feet with no AD and B HHA, 125 feet with RW and min A) Max distance: 125   Walk 10 feet activity   Assist     Assist level: 2 helpers Assistive device: No Device   Walk 50 feet activity   Assist    Assist level: Minimal Assistance - Patient > 75% Assistive device: Walker-rolling    Walk 150 feet activity   Assist Walk 150 feet activity did not occur: Safety/medical concerns         Walk 10 feet on uneven surface  activity   Assist     Assist level: Minimal Assistance -  Patient > 75% Assistive device: Walker-rolling   Wheelchair     Assist Is the patient using a wheelchair?: Yes Type of Wheelchair: Manual    Wheelchair assist level: Total Assistance - Patient < 25%      Wheelchair 50 feet with 2 turns activity    Assist        Assist Level: Total Assistance - Patient < 25%   Wheelchair 150 feet activity     Assist      Assist Level: Total Assistance - Patient < 25%   Blood pressure 99/68, pulse 96, temperature 98.6 F (37 C), resp. rate 16, weight 68.7 kg, SpO2 98%.  Medical Problem List and Plan: 1. Functional deficits secondary to right thalamic capsular infarct and 2 punctate infarcts in the right occipital lobe with a possible small subacute infarct in the left frontal white matter with chronic microvascular ischemic changes             -patient may  shower             -ELOS/Goals: 10-14 days, supervision PT/OT/ST  -Continue CIR  2.  Antithrombotics: -DVT/anticoagulation:  Pharmaceutical: Lovenox             -antiplatelet therapy: Aspirin 81 mg daily and Plavix 75 mg daily x 3 months then aspirin alone 3. Pain Management: Tylenol as needed 4. Mood/Behavior/Sleep: Trazodone 100 mg nightly, Xanax 1 mg twice daily as needed             -antipsychotic agents: N/A  -9/26 Add melatonin for insomnia, does not appear he took his home Xanax last night for anxiety..  Will ask nursing to make sure he knows he can get this if needed.  -9/27 He got Xanax as needed last night, he is unable to tell me if he slept better but appears more alert 5. Neuropsych/cognition: This patient is capable of making decisions on his own behalf. 6. Skin/Wound Care: Routine skin checks 7. Fluids/Electrolytes/Nutrition: Routine in and outs with follow-up chemistries 8.  BPH.  Proscar 5 mg daily/Flomax 0.4 mg daily  -9/25 continent of bladder for past several voids, continue to monitor  -9/27 remains continent of bladder 9.  Hyperlipidemia.   Lipitor 10.  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  Follow-up outpatient CT chest CTA 11. IV infiltration on L AC fossa- warm compresses  -9/25 monitor for improvement 12.  Constipation.   -9/25 Improved with bowel movement yesterday.  Continue to monitor bowel function -9/27 increase MiraLAX to twice daily, Senokot at night 13.  Malaise/fatigue.  COVID-19 test checked and negative  -9/27 reports continued  fatigue but appears to be participating with therapy  LOS: 3 days A FACE TO FACE EVALUATION WAS PERFORMED  Fanny Dance 09/11/2023, 3:16 PM

## 2023-09-11 NOTE — IPOC Note (Signed)
Overall Plan of Care (IPOC) Patient Details Name: James [name]. MRN: [id number] DOB: [birth year]  Admitting Diagnosis: Right thalamic infarction [facility])  Hospital Problems: Principal Problem:   Right thalamic infarction [facility])     Functional Problem List: Nursing Bladder, Endurance, Medication Management, Safety  PT Balance, Behavior, Endurance, Motor, Safety, Sensory, Skin Integrity  OT Balance, Cognition, Sensory, Vision, Endurance, Motor, Perception, Safety  SLP Cognition, Nutrition  TR         Basic ADL's: OT Grooming, Bathing, Dressing, Toileting     Advanced  ADL's: OT       Transfers: PT Bed Mobility, Bed to Chair, Customer service manager, Tub/Shower     Locomotion: PT Ambulation, Psychologist, prison and probation services, Stairs     Additional Impairments: OT Fuctional Use of Upper Extremity  SLP Swallowing, Social Cognition   Problem Solving, Memory  TR      Anticipated Outcomes Item Anticipated Outcome  Self Feeding    Swallowing  modiA   Basic self-care  SBA  Toileting  SBA   Bathroom Transfers SBA  Bowel/Bladder  manage bladder w mod I assist  Transfers  supervision  Locomotion  supervision  Communication     Cognition  supervisionA  Pain  n/a  Safety/Judgment  manage w cues   Therapy Plan: PT Intensity: Minimum of 1-2 x/day ,45 to 90 minutes PT Frequency: 5 out of 7 days PT Duration Estimated Length of Stay: 7-10 days OT Intensity: Minimum of 1-2 x/day, 45 to 90 minutes OT Frequency: 5 out of 7 days OT Duration/Estimated Length of Stay: 7-10 days SLP Intensity: Minumum of 1-2 x/day, 30 to 90 minutes SLP Frequency: 1 to 3 out of 7 days SLP Duration/Estimated Length of Stay: ~10 days   Team Interventions: Nursing Interventions Patient/Family Education, Bladder Management, Medication Management, Discharge Planning, Disease Management/Prevention  PT interventions Ambulation/gait training, Discharge planning, Functional mobility training, Psychosocial  support, Therapeutic Activities, Visual/perceptual remediation/compensation, Balance/vestibular training, Disease management/prevention, Neuromuscular re-education, Skin care/wound management, Therapeutic Exercise, Wheelchair propulsion/positioning, Cognitive remediation/compensation, DME/adaptive equipment instruction, Pain management, Splinting/orthotics, UE/LE Strength taining/ROM, Community reintegration, Development worker, international aid stimulation, Patient/family education, Museum/gallery curator, UE/LE Coordination activities  OT Interventions Warden/ranger, Neuromuscular re-education, Self Care/advanced ADL retraining, Therapeutic Exercise, UE/LE Strength taining/ROM, DME/adaptive equipment instruction, Cognitive remediation/compensation, Community reintegration, Development worker, international aid stimulation, Patient/family education, UE/LE Coordination activities, Visual/perceptual remediation/compensation, Therapeutic Activities, Psychosocial support, Functional mobility training, Discharge planning  SLP Interventions Cognitive remediation/compensation, Environmental controls, Internal/external aids, Therapeutic Exercise, Cueing hierarchy, Dysphagia/aspiration precaution training, Functional tasks, Patient/family education, Therapeutic Activities  TR Interventions    SW/CM Interventions Discharge Planning, Psychosocial Support, Patient/Family Education   Barriers to Discharge MD  Medical stability  Nursing Home environment access/layout, Decreased caregiver support 2 level main B+B 1 ste w wife; Mod indep with RW (longer distances) versus SPC (shorter-distances); denies recent fall history.  No driving.  Was actively participating with HHPT prior to admission  ADLs Comments: Mod indep with ADLs and light meal prep; does have aide that comes regularly to assist patient/wife. has DME  PT Decreased caregiver support, Incontinence, Lack of/limited family support, Behavior    OT Incontinence    SLP      SW  Decreased caregiver support, Insurance for SNF coverage     Team Discharge Planning: Destination: PT-Home (pt family looking into an assisted living) ,OT- Home , SLP-Home Projected Follow-up: PT-Home health PT, OT-  Outpatient OT, SLP-Outpatient SLP, Home Health SLP Projected Equipment Needs: PT- , OT- To be determined, SLP-None recommended by SLP Equipment Details: PT-pt  has RW, rollator, WC, and SPC, and shower chair, OT-DME at home: Rollator, SPC, built-in shower, grab bars- shower, toilet. Pt used Rollator at home. Patient/family involved in discharge planning: PT- Patient, Family member/caregiver,  OT-Patient, SLP-Patient  MD ELOS: 10-14 Medical Rehab Prognosis:  Excellent Assessment: The patient has been admitted for CIR therapies with the diagnosis of right thalamic capsular infarct and 2 punctate infarcts in the right occipital lobe with a possible small subacute infarct in the left frontal white matter with chronic microvascular ischemic changes . The team will be addressing functional mobility, strength, stamina, balance, safety, adaptive techniques and equipment, self-care, bowel and bladder mgt, patient and caregiver education. Goals have been set at Sup with PT/OT/SLP. Anticipated discharge destination is home.        See Team Conference Notes for weekly updates to the plan of care

## 2023-09-11 NOTE — Progress Notes (Signed)
Occupational Therapy Session Note  Patient Details  Name: James [name]. MRN: [id number] Date of Birth: 06-Oct-1934  Today's Date: 09/11/2023 OT Individual Time: 4098-1191 OT Individual Time Calculation (min): 58 min    Short Term Goals: Week 1:  OT Short Term Goal 1 (Week 1): Pt will complete LB dressing with Min A while utilizing both UE's equally throughout activity. OT Short Term Goal 2 (Week 1): Pt will demonstrate improved left UE awareness while participating in self care task requiring Min VC as needed.  Skilled Therapeutic Interventions/Progress Updates: Patient received resting in bed. Agreeable to getting up for am ADL's. Patient transitioned from supine to sitting without physical assist, but increased time, then ambulated with walker to the bathroom. Performed toileting with CGA for balance in standing. Patient able to slip on shoes with minimal assist for walk to the bathroom, but would benefit from a shoe horn. Patient then ambulated with RW to sink for standing grooming tasks. Patient declined shower or shaving at the sink saying he wanted to do that later. Grooming performed at the sink with patient requesting to brush teeth after he finishes his breakfast. Patient ambulated to the recliner and was able to set up containers and eat his breakfast without assist. Continue with skilled OT POC to progress patient towards LTG's.     Therapy Documentation Precautions:  Precautions Precautions: Fall, Other (comment) Precaution Comments: Zio patch, DNR Restrictions Weight Bearing Restrictions: No General:   Vital Signs: Therapy Vitals Temp: 97.9 F (36.6 C) Pulse Rate: 78 Resp: 17 BP: 131/70 Patient Position (if appropriate): Lying Oxygen Therapy SpO2: 97 % O2 Device: Room Air Pain:Reports having aches, but no pain.   ADL: Eating: SPV Grooming: set up assist UB dressing: set up assist LB dressing: CGA/Min Toileting: CGA  Balance: continues to need close  SBA/CGA for dynamic standing activities for safety, Needed cues for safe use of RW for transition into the bathroom and for standing tasks at the sink.      Therapy/Group: Individual Therapy  Warnell Forester 09/11/2023, 12:16 PM

## 2023-09-12 DIAGNOSIS — I6381 Other cerebral infarction due to occlusion or stenosis of small artery: Secondary | ICD-10-CM | POA: Diagnosis not present

## 2023-09-12 MED ORDER — SORBITOL 70 % SOLN
30.0000 mL | Freq: Every day | Status: DC | PRN
  Administered 2023-09-13 – 2023-09-19 (×2): 30 mL via ORAL
  Filled 2023-09-12 (×2): qty 30

## 2023-09-12 NOTE — Progress Notes (Signed)
Speech Language Pathology Daily Session Note  Patient Details  Name: James [name]. MRN: [id number] Date of Birth: 1934-01-21  Today's Date: 09/12/2023 SLP Individual Time: 1300-1345 SLP Individual Time Calculation (min): 45 min  Short Term Goals: Week 1: SLP Short Term Goal 1 (Week 1): Patient will follow swallowing strategies during consumption of least restrictive diet with supervision multimodal A SLP Short Term Goal 2 (Week 1): Patient will demonstrate ability to problem solve in mildly complex functional situations with mod multimodal A SLP Short Term Goal 3 (Week 1): Patient will recall and utilize memory compensatory strategies with mod multimodalA  Skilled Therapeutic Interventions: Skilled therapy session focused on cognitive and dysphagia goals. SLP faciliated session by introducing memory book as patient reports inability to remember prior days therapy events. In book, patient independently wrote what he had for lunch and memory strategies. Patient required modA to recall strategies at the beginning of the session, however able to independently recall at the end. Upon completion of memory book, SLP faciliated money management task. Patient was prompted to count change according to verbalized amount and required minA to complete. Patient occasionally confusing quarters and nickels, otherwise completed task well. Dysphagia goals were addressed through thin liquid trials via provale. Patient with cough x1 on initial swallow, however no other s/sx of aspiration. Recommend continuation of current diet wit NTL and thin liquids via provale. Intermittent supervision. Patient left in bed with alarm set and call bell in reach. Continue POC.   Pain Pain Assessment Pain Scale: 0-10 Pain Score: 0-No pain  Therapy/Group: Individual Therapy  [name] M.A., CF-SLP 09/12/2023, 1:45 PM

## 2023-09-12 NOTE — Progress Notes (Signed)
PROGRESS NOTE   Subjective/Complaints:  Pt without new issues, wife and daughter are visiting at bedside  ROS: Denies fever, chills, shortness of breath, chest pain, abdominal pain, nausea, vomiting, rash, headache + Fatigue and malaise Objective:   No results found. No results for input(s): "WBC", "HGB", "HCT", "PLT" in the last 72 hours.  No results for input(s): "NA", "K", "CL", "CO2", "GLUCOSE", "BUN", "CREATININE", "CALCIUM" in the last 72 hours.   Intake/Output Summary (Last 24 hours) at 09/12/2023 1346 Last data filed at 09/12/2023 1200 Gross per 24 hour  Intake 596 ml  Output 725 ml  Net -129 ml        Physical Exam: Vital Signs Blood pressure (!) 122/50, pulse (!) 52, temperature 97.8 F (36.6 C), temperature source Oral, resp. rate 17, weight 68.7 kg, SpO2 97%.   intact.  Mildly dysarthric speech.  Memory deficits present Decreased sensation to light touch left upper extremity left lower extremity RUE- 5/5 throughout LUE- 4+/5 but cannot make fist- of note, fingers on L hand smaller than R hand- smaller circumference RLE- 5/5 throughout LLE- HF 4/5; KE 4+/5; DF/PF 5-/5  Musculoskeletal: No joint swelling noted   Assessment/Plan: 1. Functional deficits which require 3+ hours per day of interdisciplinary therapy in a comprehensive inpatient rehab setting. Physiatrist is providing close team supervision and 24 hour management of active medical problems listed below. Physiatrist and rehab team continue to assess barriers to discharge/monitor patient progress toward functional and medical goals  Care Tool:  Bathing    Body parts bathed by patient: Right arm, Left arm, Chest, Abdomen, Front perineal area, Buttocks, Right upper leg, Left lower leg, Face   Body parts bathed by helper: Right lower leg, Left lower leg     Bathing assist Assist Level: Minimal Assistance - Patient > 75%     Upper Body  Dressing/Undressing Upper body dressing   What is the patient wearing?: Pull over shirt    Upper body assist Assist Level: Set up assist    Lower Body Dressing/Undressing Lower body dressing      What is the patient wearing?: Pants, Incontinence brief     Lower body assist Assist for lower body dressing: Minimal Assistance - Patient > 75%     Toileting Toileting    Toileting assist Assist for toileting: Minimal Assistance - Patient > 75%     Transfers Chair/bed transfer  Transfers assist     Chair/bed transfer assist level: 2 Helpers (+2 for B HHA with no AD (for pt comfort 2/2 FOF))     Locomotion Ambulation   Ambulation assist      Assist level: 2 helpers (+2 for B HHA with no AD (for pt comfort 2/2 FOF)) Assistive device: No Device (25 feet with no AD and B HHA, 125 feet with RW and min A) Max distance: 125   Walk 10 feet activity   Assist     Assist level: 2 helpers Assistive device: No Device   Walk 50 feet activity   Assist    Assist level: Minimal Assistance - Patient > 75% Assistive device: Walker-rolling    Walk 150 feet activity   Assist Walk 150 feet activity  did not occur: Safety/medical concerns         Walk 10 feet on uneven surface  activity   Assist     Assist level: Minimal Assistance - Patient > 75% Assistive device: Walker-rolling   Wheelchair     Assist Is the patient using a wheelchair?: Yes Type of Wheelchair: Manual    Wheelchair assist level: Total Assistance - Patient < 25%      Wheelchair 50 feet with 2 turns activity    Assist        Assist Level: Total Assistance - Patient < 25%   Wheelchair 150 feet activity     Assist      Assist Level: Total Assistance - Patient < 25%   Blood pressure (!) 122/50, pulse (!) 52, temperature 97.8 F (36.6 C), temperature source Oral, resp. rate 17, weight 68.7 kg, SpO2 97%.  Medical Problem List and Plan: 1. Functional deficits secondary  to right thalamic capsular infarct and 2 punctate infarcts in the right occipital lobe with a possible small subacute infarct in the left frontal white matter with chronic microvascular ischemic changes             -patient may  shower             -ELOS/Goals: 10-14 days, supervision PT/OT/ST  -Continue CIR  2.  Antithrombotics: -DVT/anticoagulation:  Pharmaceutical: Lovenox             -antiplatelet therapy: Aspirin 81 mg daily and Plavix 75 mg daily x 3 months then aspirin alone 3. Pain Management: Tylenol as needed 4. Mood/Behavior/Sleep: Trazodone 100 mg nightly, Xanax 1 mg twice daily as needed             -antipsychotic agents: N/A  -9/26 Add melatonin for insomnia, does not appear he took his home Xanax last night for anxiety..  Will ask nursing to make sure he knows he can get this if needed.  -9/27 He got Xanax as needed last night, he is unable to tell me if he slept better but appears more alert 5. Neuropsych/cognition: This patient is capable of making decisions on his own behalf. 6. Skin/Wound Care: Routine skin checks 7. Fluids/Electrolytes/Nutrition: Routine in and outs with follow-up chemistries 8.  BPH.  Proscar 5 mg daily/Flomax 0.4 mg daily  -9/25 continent of bladder for past several voids, continue to monitor  -9/27 remains continent of bladder 9.  Hyperlipidemia.  Lipitor 10.  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  Follow-up outpatient CT chest CTA 11. IV infiltration on L AC fossa- warm compresses  -9/25 monitor for improvement 12.  Constipation.   -9/25 Improved with bowel movement yesterday.  Continue to monitor bowel function -9/27 increase MiraLAX to twice daily, Senokot at night Daily BM, continent for the last 2 d 13.  Malaise/fatigue.  COVID-19 test checked and negative  -9/27 reports continued fatigue but appears to be participating with therapy  LOS: 4 days A FACE TO FACE EVALUATION WAS PERFORMED  Erick Colace 09/12/2023, 1:46 PM

## 2023-09-13 DIAGNOSIS — I6381 Other cerebral infarction due to occlusion or stenosis of small artery: Secondary | ICD-10-CM | POA: Diagnosis not present

## 2023-09-13 DIAGNOSIS — K5901 Slow transit constipation: Secondary | ICD-10-CM | POA: Diagnosis not present

## 2023-09-13 DIAGNOSIS — I69391 Dysphagia following cerebral infarction: Secondary | ICD-10-CM

## 2023-09-13 NOTE — Progress Notes (Signed)
PROGRESS NOTE   Subjective/Complaints:  C/o constipation , states that coffee usually makes him have BM  ROS: Denies fever, chills, shortness of breath, chest pain, abdominal pain, nausea, vomiting, rash, headache + Fatigue and malaise Objective:   No results found. No results for input(s): "WBC", "HGB", "HCT", "PLT" in the last 72 hours.  No results for input(s): "NA", "K", "CL", "CO2", "GLUCOSE", "BUN", "CREATININE", "CALCIUM" in the last 72 hours.   Intake/Output Summary (Last 24 hours) at 09/13/2023 0945 Last data filed at 09/13/2023 0700 Gross per 24 hour  Intake 354 ml  Output 875 ml  Net -521 ml        Physical Exam: Vital Signs Blood pressure 121/69, pulse 83, temperature 98.1 F (36.7 C), temperature source Oral, resp. rate 15, weight 68.7 kg, SpO2 93%.   General: No acute distress Mood and affect are appropriate Heart: Regular rate and rhythm no rubs murmurs or extra sounds Lungs: Clear to auscultation, breathing unlabored, no rales or wheezes Abdomen: Positive bowel sounds, soft nontender to palpation, nondistended Extremities: No clubbing, cyanosis, or edema Skin: No evidence of breakdown, no evidence of rash   Neuro  Mildly dysarthric speech.  Memory deficits present Decreased sensation to light touch left upper extremity left lower extremity RUE- 5/5 throughout LUE- 4+/5 but cannot make fist- of note, fingers on L hand smaller than R hand- smaller circumference RLE- 5/5 throughout LLE- HF 4/5; KE 4+/5; DF/PF 5-/5  Musculoskeletal: No joint swelling noted   Assessment/Plan: 1. Functional deficits which require 3+ hours per day of interdisciplinary therapy in a comprehensive inpatient rehab setting. Physiatrist is providing close team supervision and 24 hour management of active medical problems listed below. Physiatrist and rehab team continue to assess barriers to discharge/monitor patient  progress toward functional and medical goals  Care Tool:  Bathing    Body parts bathed by patient: Right arm, Left arm, Chest, Abdomen, Front perineal area, Buttocks, Right upper leg, Left lower leg, Face   Body parts bathed by helper: Right lower leg, Left lower leg     Bathing assist Assist Level: Minimal Assistance - Patient > 75%     Upper Body Dressing/Undressing Upper body dressing   What is the patient wearing?: Pull over shirt    Upper body assist Assist Level: Set up assist    Lower Body Dressing/Undressing Lower body dressing      What is the patient wearing?: Pants, Incontinence brief     Lower body assist Assist for lower body dressing: Minimal Assistance - Patient > 75%     Toileting Toileting    Toileting assist Assist for toileting: Minimal Assistance - Patient > 75%     Transfers Chair/bed transfer  Transfers assist     Chair/bed transfer assist level: 2 Helpers (+2 for B HHA with no AD (for pt comfort 2/2 FOF))     Locomotion Ambulation   Ambulation assist      Assist level: 2 helpers (+2 for B HHA with no AD (for pt comfort 2/2 FOF)) Assistive device: No Device (25 feet with no AD and B HHA, 125 feet with RW and min A) Max distance: 125   Walk 10  feet activity   Assist     Assist level: 2 helpers Assistive device: No Device   Walk 50 feet activity   Assist    Assist level: Minimal Assistance - Patient > 75% Assistive device: Walker-rolling    Walk 150 feet activity   Assist Walk 150 feet activity did not occur: Safety/medical concerns         Walk 10 feet on uneven surface  activity   Assist     Assist level: Minimal Assistance - Patient > 75% Assistive device: Walker-rolling   Wheelchair     Assist Is the patient using a wheelchair?: Yes Type of Wheelchair: Manual    Wheelchair assist level: Total Assistance - Patient < 25%      Wheelchair 50 feet with 2 turns activity    Assist         Assist Level: Total Assistance - Patient < 25%   Wheelchair 150 feet activity     Assist      Assist Level: Total Assistance - Patient < 25%   Blood pressure 121/69, pulse 83, temperature 98.1 F (36.7 C), temperature source Oral, resp. rate 15, weight 68.7 kg, SpO2 93%.  Medical Problem List and Plan: 1. Functional deficits secondary to right thalamic capsular infarct and 2 punctate infarcts in the right occipital lobe with a possible small subacute infarct in the left frontal white matter with chronic microvascular ischemic changes             -patient may  shower             -ELOS/Goals: 10-14 days, supervision PT/OT/ST  -Continue CIR  2.  Antithrombotics: -DVT/anticoagulation:  Pharmaceutical: Lovenox             -antiplatelet therapy: Aspirin 81 mg daily and Plavix 75 mg daily x 3 months then aspirin alone 3. Pain Management: Tylenol as needed 4. Mood/Behavior/Sleep: Trazodone 100 mg nightly, Xanax 1 mg twice daily as needed             -antipsychotic agents: N/A  -9/26 Add melatonin for insomnia, does not appear he took his home Xanax last night for anxiety..  Will ask nursing to make sure he knows he can get this if needed.  -9/27 He got Xanax as needed last night, he is unable to tell me if he slept better but appears more alert 5. Neuropsych/cognition: This patient is capable of making decisions on his own behalf. 6. Skin/Wound Care: Routine skin checks 7. Fluids/Electrolytes/Nutrition: Routine in and outs with follow-up chemistries 8.  BPH.  Proscar 5 mg daily/Flomax 0.4 mg daily  -9/25 continent of bladder for past several voids, continue to monitor  -9/27 remains continent of bladder 9.  Hyperlipidemia.  Lipitor 10.  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  Follow-up outpatient CT chest CTA 11. IV infiltration on L AC fossa- warm compresses  -9/25 monitor for improvement 12.  Constipation.   -9/25 Improved with bowel movement  yesterday.  Continue to monitor bowel function -9/27 increase MiraLAX to twice daily, Senokot at night Will give sorbitol 30mL today , pt states that he usually has BM after  coffee  13.  Malaise/fatigue.  COVID-19 test checked and negative  -9/27 reports continued fatigue but appears to be participating with therapy  LOS: 5 days A FACE TO FACE EVALUATION WAS PERFORMED  Erick Colace 09/13/2023, 9:45 AM

## 2023-09-13 NOTE — Progress Notes (Signed)
Occupational Therapy Session Note  Patient Details  Name: James [name]. MRN: [id number] Date of Birth: [birth year]  Session 1: {CHL IP REHAB OT TIME CALCULATIONS:304400400} Session 2: {CHL IP REHAB OT TIME CALCULATIONS:304400400}   Short Term Goals: Week 1:  OT Short Term Goal 1 (Week 1): Pt will complete LB dressing with Min A while utilizing both UE's equally throughout activity. OT Short Term Goal 2 (Week 1): Pt will demonstrate improved left UE awareness while participating in self care task requiring Min VC as needed.  Skilled Therapeutic Interventions/Progress Updates:    Session 1: Patient agreeable to participate in OT session. Reports *** pain level.   Patient participated in skilled OT session focusing on ***. Therapist facilitated/assessed/developed/educated/integrated/elicited *** in order to improve/facilitate/promote   Session 2: Patient agreeable to participate in OT session. Reports *** pain level.   Patient participated in skilled OT session focusing on ***. Therapist facilitated/assessed/developed/educated/integrated/elicited *** in order to improve/facilitate/promote    Therapy Documentation Precautions:  Precautions Precautions: Fall, Other (comment) Precaution Comments: Zio patch, DNR Restrictions Weight Bearing Restrictions: No  Therapy/Group: Individual Therapy  [name], OTR/L,CBIS  Supplemental OT - [facility] and [facility] Secure Chat Preferred   09/13/2023, 10:13 PM

## 2023-09-13 NOTE — Progress Notes (Signed)
Physical Therapy Session Note  Patient Details  Name: James [name]. MRN: [id number] Date of Birth: [birth year]  Today's Date: 09/13/2023 PT Individual Time: 1620-1736 PT Individual Time Calculation (min): 76 min   Short Term Goals: Week 1:  PT Short Term Goal 1 (Week 1): STG=LTG 2/2 ELOS  Skilled Therapeutic Interventions/Progress Updates:    Pt received supine in bed reading newspaper with his son, Thayer Ohm, present and pt agreeable to therapy session. Supine>sitting L EOB, HOB flat and not using bedrail, with close SBA for safety. Sitting EOB donned shoes set-up assist. Sit>stand EOB>RW requiring cues to push up with 1 hand from bed with CGA/light min A. R stand pivot to w/c using RW with CGA/light min A for steadying.  Ziopatch monitor pack brought with pt during therapy session.  Pt reports feeling overall "tired" but able to fully participate in session despite this.   Transported to/from gym in w/c for time management and energy conservation.  Sit<>stands using RW with CGA and continued cuing to push up with 1 hand from seat with poor recall/carryover of this during session. Sit<>stands, no AD, with pt pushing up with both hands from armrests, but requires cuing to bring feet back underneath BOS before initiating transfer.  Gait training 145ft using RW with CGA/light in assist for steadying/balance. Pt demonstrating the following gait deviations with therapist providing the described cuing and facilitation for improvement:  - heavy dependence on RW support for stability - slightly narrow BOS - min forward trunk flexed posture  - decreased gait speed - min postural instability  - min reciprocal stepping pattern  Gait training 147ft using L HHA progressed to no UE support after ~47ft with light min assist for balance. Pt demonstrating the following gait deviations with therapist providing the described cuing and facilitation for improvement:  - L foot heavy landing on initial  contact - min lateral postural instability  - narrow BOS - decreased step lengths for more of a step-to pattern, but improving to reciprocal pattern with increasing distance - pt appears to have poor awareness of B LE foot placement and when feeling unstable has quick, incoordinated movements to regain balance  When going to sit after 2nd walk, pt's gaze goes into a blank stare <5seconds, but when therapist states his name he is able to redirect gaze and communicate reports of feeling off but unable to clarify. Appears pt was doing this due to fatigue. Assessed vitals immediately after: HR 94bpm and SpO2 99% After ~40min seated rest break to allow retrieval of BP cuff: BP 147/86 (MAP 99), HR 100bpm  Standing for orthostatic vitals: BP 138/86 (MAP 98), HR 108bpm   Gait training additional ~46ft using L HHA with min assist for balance, improving reciprocal stepping with increasing symmetry and normalizing gait mechanics, slight narrow BOS. Vitals assessed after: BP 143/104 (MAP 114), HR 100bpm, reassessed because anticipate pt was not fully relaxing his arm: BP 145/84 (MAP 100), HR 102bpm, SpO2 100% Pt reports just feeling "tired" and "shaky"  Dynamic gait training, no AD, including: - forward/backwards walking ~78ft x3 in each direction with light min A for balance - demos improving reciprocal stepping pattern forward and improving to reciprocal stepping pattern backwards - has min balance instability but doesn't require increased assistance - side stepping R/L 39ft x2 each direction with light min A for balance - demos adequate step lengths in both directions when cued  Requires seated rest breaks throughout session due to fatigue.  Stair navigation training ascending/descending 4  steps (6" height) using B HRs with min assist for stability, reciprocal pattern in both directions.  Pt reports need to go to bathroom - transported back to room in w/c.  Gait ~108ft into bathroom, no AD, with  CGA/light min A - dependent LB clothing management for urgency. Sit<>stand to/from toilet using grab bar with CGA. Pt requiring increased time for toileting, but unable to void. Gait training to recliner, no AD, with min A for balance.   Pt left seated in recliner with needs in reach, seat belt alarm on, meal tray set-up (thin liquid in provale cup), and nursing staff aware of pt's position for intermittent supervision for meal.   Therapy Documentation Precautions:  Precautions Precautions: Fall, Other (comment) Precaution Comments: Zio patch, DNR Restrictions Weight Bearing Restrictions: No   Pain: Denies pain during session.    Therapy/Group: Individual Therapy  Ginny Forth , PT, DPT, NCS, CSRS 09/13/2023, 3:36 PM

## 2023-09-13 NOTE — Progress Notes (Signed)
Occupational Therapy Session Note  Patient Details  Name: James [name]. MRN: [id number] Date of Birth: Apr 01, 1934  Today's Date: 09/13/2023 OT Individual Time: 1100-1155 OT Individual Time Calculation (min): 55 min    Short Term Goals: Week 1:  OT Short Term Goal 1 (Week 1): Pt will complete LB dressing with Min A while utilizing both UE's equally throughout activity. OT Short Term Goal 2 (Week 1): Pt will demonstrate improved left UE awareness while participating in self care task requiring Min VC as needed. Week 2:     Skilled Therapeutic Interventions/Progress Updates:    1:1 Pt received in the bed all the way down in the bed with difficulty figuring out how to fix the bed for better positioning.  Pt was one the phone with his wife who is currently in the ED. Pt declined shower today but agreed to self care retraining at sink level . Pt had been incontinent of urine in brief - pt unaware but also had urine in urinal next to bed. Pt with decr static and dynamic sitting balance - leaning significantly to the left with decr awareness requiring cues to self correct. Pt continues to report fatigue throughout session. Pt required min cues and extra time to properly orient clothing.  Pt able to wash all parts but toes and dress with overall min A with extra time due to fatigue. Pt required A to thread underwear and pants ove second leg but could do the first. Min A for sit to stand and standing balance without UE support. Pt shaved with supervision for safety. Pt ambulated from door to the recliner in the window with RW with min A with shoes donned. Pt was confused about the date but using a calendar could be oriented.   Pt left sitting in the recliner with safety belt donned.   Therapy Documentation Precautions:  Precautions Precautions: Fall, Other (comment) Precaution Comments: Zio patch, DNR Restrictions Weight Bearing Restrictions: No  Pain:  No c/o pain in session today     Therapy/Group: Individual Therapy  Roney Mans [facility] 09/13/2023, 11:38 AM

## 2023-09-13 NOTE — Progress Notes (Signed)
Speech Language Pathology Daily Session Note  Patient Details  Name: James [name]. MRN: [id number] Date of Birth: July 05, 1934  Today's Date: 09/13/2023 SLP Individual Time: 0200-0300 SLP Individual Time Calculation (min): 60 min  Short Term Goals: Week 1: SLP Short Term Goal 1 (Week 1): Patient will follow swallowing strategies during consumption of least restrictive diet with supervision multimodal A SLP Short Term Goal 2 (Week 1): Patient will demonstrate ability to problem solve in mildly complex functional situations with mod multimodal A SLP Short Term Goal 3 (Week 1): Patient will recall and utilize memory compensatory strategies with mod multimodalA  Skilled Therapeutic Interventions:  Pt was seen in PM to address cognitive re- training. Pt was alert and seated upright in recliner upon SLP arrival. He denied pain and was agreeable for session. Pt able to verbalize PLOF and events leading up to stroke and subsequent hospitalization. Pt engaged in conversation regarding likely move to a senior living community with pt in agreement with his family's decision. Pt also able to verbalize some deficits as a result of recent CVA including walking and increased fatigue. Pt's memory notebook sitting on table in front of him. Pt had been indep writing in notebook and states it has helped. He indep recalled SLP's name who instructed him in use of memory book. SLP reviewed additional strategies including writing information down, repetition, and association. Given a moderate level paragraph presented verbally, SLP guided pt in utilization of repetition and association strategies. Pt recalled information after a distracted delay with 100% acc with sup A. In additional minutes of session, SLP addressed problem solving. Given scenarios regarding current environment, pt identified solutions with 80% acc with min A. Direct hand off to NSG at end of session. SLP to continue POC.   Pain Pain  Assessment Pain Scale: 0-10 Pain Score: 0-No pain  Therapy/Group: Individual Therapy  Renaee Munda 09/13/2023, 3:21 PM

## 2023-09-14 DIAGNOSIS — G4700 Insomnia, unspecified: Secondary | ICD-10-CM | POA: Diagnosis not present

## 2023-09-14 DIAGNOSIS — N189 Chronic kidney disease, unspecified: Secondary | ICD-10-CM

## 2023-09-14 DIAGNOSIS — N400 Enlarged prostate without lower urinary tract symptoms: Secondary | ICD-10-CM

## 2023-09-14 DIAGNOSIS — R5381 Other malaise: Secondary | ICD-10-CM | POA: Diagnosis not present

## 2023-09-14 DIAGNOSIS — K5900 Constipation, unspecified: Secondary | ICD-10-CM | POA: Diagnosis not present

## 2023-09-14 DIAGNOSIS — I6381 Other cerebral infarction due to occlusion or stenosis of small artery: Secondary | ICD-10-CM | POA: Diagnosis not present

## 2023-09-14 NOTE — Progress Notes (Incomplete)
Occupational Therapy Weekly Progress Note  Patient Details  Name: James [name]. MRN: [id number] Date of Birth: 06-30-1934  Beginning of progress report period: September 09, 2023 End of progress report period: September 15, 2023  {CH[facility] IP REHAB OT TIME CALCULATIONS:304400400}  {CHL IP REHAB OT TIME CALCULATIONS:304400400}  Patient has met {number 1-5:22450} of {number 1-5:20334} short term goals.  ***  Patient continues to demonstrate the following deficits: {impairments:3041632} and therefore will continue to benefit from skilled OT intervention to enhance overall performance with {ADL/iADL:3041649}.  Patient {LTG progression:3041653}.  {plan of WUJW:1191478}  OT Short Term Goals {OT GNF:6213086}  Skilled Therapeutic Interventions/Progress Updates:   Session 1: Pt received *** for skilled OT session with focus on ***. Pt agreeable to interventions, demonstrating overall *** mood. Pt reported ***/10 pain, stating "***" in reference to ***. OT offering intermediate rest breaks and positioning suggestions throughout session to address pain/fatigue and maximize participation/safety in session.    Pt remained *** with all immediate needs met at end of session. Pt continues to be appropriate for skilled OT intervention to promote further functional independence.   Session 2: Pt received *** for skilled OT session with focus on ***. Pt agreeable to interventions, demonstrating overall *** mood. Pt reported ***/10 pain, stating "***" in reference to ***. OT offering intermediate rest breaks and positioning suggestions throughout session to address pain/fatigue and maximize participation/safety in session.    Pt remained *** with all immediate needs met at end of session. Pt continues to be appropriate for skilled OT intervention to promote further functional independence.    Therapy Documentation Precautions:  Precautions Precautions: Fall, Other (comment) Precaution Comments:  Zio patch, DNR Restrictions Weight Bearing Restrictions: No General:   Vital Signs: Therapy Vitals Temp: 98.1 F (36.7 C) Pulse Rate: 86 Resp: 18 BP: 127/71 Patient Position (if appropriate): Lying Oxygen Therapy SpO2: 97 % O2 Device: Room Air Pain:   ADL: ADL Eating: Supervision/safety Where Assessed-Eating: Bed level Grooming: Supervision/safety Where Assessed-Grooming: Standing at sink Upper Body Bathing: Supervision/safety, Moderate cueing Where Assessed-Upper Body Bathing: Sitting at sink Lower Body Bathing: Supervision/safety, Moderate cueing Where Assessed-Lower Body Bathing: Other (Comment) (toilet sitting/standing) Upper Body Dressing: Minimal assistance Where Assessed-Upper Body Dressing: Chair Lower Body Dressing: Moderate assistance Where Assessed-Lower Body Dressing: Chair Toilet Transfer: Minimal assistance Toilet Transfer Method: Proofreader: Grab bars (low toilet) Tub/Shower Transfer: Not assessed Film/video editor: Not assessed Vision   Perception    Praxis   Exercises:   Other Treatments:     Therapy/Group: {Therapy/Group:3049007}  Isabella Stalling 09/14/2023, 10:26 PM

## 2023-09-14 NOTE — Progress Notes (Signed)
Physical Therapy Session Note  Patient Details  Name: James [name]. MRN: [id number] Date of Birth: [birth year]  Today's Date: 09/14/2023 PT Individual Time: 0800-0843 PT Individual Time Calculation (min): 43 min   Short Term Goals: Week 1:  PT Short Term Goal 1 (Week 1): STG=LTG 2/2 ELOS  Skilled Therapeutic Interventions/Progress Updates:      Pt supine in bed upon arrival with RN adiministering medications. Pt agreeable to therpay. Pt denies any pain, but reports being tired. Pt BP 107/74 60 at end of session.   Supine to sit with bed flat and no rails with supervision. Seated EOB, donned ted hose with total A, donned shoes with supervision/set up assist.   Pt ambulated 134 feet with no AD and L HHA with intermittent use of R UE support on rail in hallway 2/2 pt FOF and  min-mod A, pt demos inconsistent step length, especially with L LE, verbal cues provided for reciprocal gait.   Pt performed 1x5 sit to stand on airex pad with L HHA, verbal cues provided for use of R UE on mat table for power up and controlled descent, verbal/tactile cues provided for hip extension and upright posture in standing. Progressed to 1x5 sit to stand on airex pad with min A with verbal and tactile cues provided for B UE support on mat table for power up and controlled descent with verbal and tactile cues provided for upright posture in standing. Pt requiring min-mod A for standing balance without UE support.   Pt demos improved carry over of sit to stand x5 on firm surface with CGA, verbal cues provided for use of B UE support for controlled descent.   Pt ambulated 134 feet with no AD from ortho gym to room with no AD and L HHA, pt continues to demo inconsistent step length but improved in comapirson to first trial, with verbal cues for increased step length bilaterally, with min A and L HHA only (no use of R UE support on rail). Pt perfromed ambulatory transfer to recliner with min-mod A 2/2 pt  attemtping to sit prematurely, education provided to not sit until back of leg touching chair.   Pt seated in recliner at end of session with all needs within reach and seatbelt alarm on.      Therapy Documentation Precautions:  Precautions Precautions: Fall, Other (comment) Precaution Comments: Zio patch, DNR Restrictions Weight Bearing Restrictions: No  Therapy/Group: Individual Therapy  [facility] Ambrose [place], , DPT  09/14/2023, 7:38 AM

## 2023-09-14 NOTE — Progress Notes (Signed)
Speech Language Pathology Daily Session Note  Patient Details  Name: James [name]. MRN: [id number] Date of Birth: 07-28-1934  Today's Date: 09/14/2023 SLP Individual Time: 1100-1159 SLP Individual Time Calculation (min): 59 min  Short Term Goals: Week 1: SLP Short Term Goal 1 (Week 1): Patient will follow swallowing strategies during consumption of least restrictive diet with supervision multimodal A SLP Short Term Goal 2 (Week 1): Patient will demonstrate ability to problem solve in mildly complex functional situations with mod multimodal A SLP Short Term Goal 3 (Week 1): Patient will recall and utilize memory compensatory strategies with mod multimodalA  Skilled Therapeutic Interventions:  Pt was seen in am to address cognitive re- training. Pt was alert and seated upright in recliner upon SLP arrival. He endorsed fatigue but denied pain. SLP challenged pt in problem solving and memory. SLP reviewed WRAP compensatory strategeies and examples of utilization. Pt was subsequently challenged in recall of moderate level information. SLP assisted pt in use of repetition and association strategies. Given a 20 minute delay he recalled information with 100% acc sup A. In additional minutes of session, SLP challenged pt in scheduling task including scheduling appointments and identifying events with mod A. SLP also challenged pt in identifying errors given a schematic of a BID pill box. Pt completed task with min to mod A. Pt was left seated upright in recliner with call button within reach and chair alarm active. SLP to continue POC.   Pain Pain Assessment Pain Scale: 0-10 Pain Score: 0-No pain  Therapy/Group: Individual Therapy  Renaee Munda 09/14/2023, 11:52 AM

## 2023-09-14 NOTE — Progress Notes (Addendum)
PROGRESS NOTE   Subjective/Complaints:  Constipation improved with bowel movements yesterday.  Reports he still feels a little tired.  Reports he slept well last night.  ROS: Denies fever, chills, shortness of breath, chest pain, abdominal pain, nausea, vomiting, rash, headache + Fatigue and malaise-unchanged Constipation-improved  Objective:   No results found. No results for input(s): "WBC", "HGB", "HCT", "PLT" in the last 72 hours.  No results for input(s): "NA", "K", "CL", "CO2", "GLUCOSE", "BUN", "CREATININE", "CALCIUM" in the last 72 hours.   Intake/Output Summary (Last 24 hours) at 09/14/2023 1007 Last data filed at 09/14/2023 0737 Gross per 24 hour  Intake 597 ml  Output --  Net 597 ml        Physical Exam: Vital Signs Blood pressure 123/62, pulse (!) 49, temperature 98.7 F (37.1 C), temperature source Oral, resp. rate 17, weight 68.7 kg, SpO2 96%.   General: No acute distress, sitting in chair appears comfortable Mood and affect are appropriate Heart: Regular rate and rhythm no rubs murmurs or extra sounds Lungs: Clear to auscultation, breathing unlabored, no rales or wheezes Abdomen: Positive bowel sounds, soft nontender to palpation, nondistended Extremities: No clubbing, cyanosis, or edema Skin: No evidence of breakdown, no evidence of rash   Neuro  Mildly dysarthric speech-improving.  Memory deficits present Decreased sensation to light touch left upper extremity left lower extremity RUE- 5/5 throughout LUE- 4+/5 but cannot make fist- of note, fingers on L hand smaller than R hand- smaller circumference RLE- 5/5 throughout LLE- HF 4/5; KE 4+/5; DF/PF 5-/5  Musculoskeletal: No joint swelling or tenderness noted   Assessment/Plan: 1. Functional deficits which require 3+ hours per day of interdisciplinary therapy in a comprehensive inpatient rehab setting. Physiatrist is providing close team  supervision and 24 hour management of active medical problems listed below. Physiatrist and rehab team continue to assess barriers to discharge/monitor patient progress toward functional and medical goals  Care Tool:  Bathing    Body parts bathed by patient: Right arm, Left arm, Chest, Abdomen, Front perineal area, Buttocks, Right upper leg, Left lower leg, Face, Left upper leg   Body parts bathed by helper: Left lower leg     Bathing assist Assist Level: Minimal Assistance - Patient > 75%     Upper Body Dressing/Undressing Upper body dressing   What is the patient wearing?: Pull over shirt    Upper body assist Assist Level: Set up assist    Lower Body Dressing/Undressing Lower body dressing      What is the patient wearing?: Pants, Underwear/pull up     Lower body assist Assist for lower body dressing: Minimal Assistance - Patient > 75%     Toileting Toileting    Toileting assist Assist for toileting: Minimal Assistance - Patient > 75%     Transfers Chair/bed transfer  Transfers assist     Chair/bed transfer assist level: Minimal Assistance - Patient > 75%     Locomotion Ambulation   Ambulation assist      Assist level: 2 helpers (+2 for B HHA with no AD (for pt comfort 2/2 FOF)) Assistive device: No Device (25 feet with no AD and B HHA, 125 feet with  RW and min A) Max distance: 125   Walk 10 feet activity   Assist     Assist level: 2 helpers Assistive device: No Device   Walk 50 feet activity   Assist    Assist level: Minimal Assistance - Patient > 75% Assistive device: Walker-rolling    Walk 150 feet activity   Assist Walk 150 feet activity did not occur: Safety/medical concerns         Walk 10 feet on uneven surface  activity   Assist     Assist level: Minimal Assistance - Patient > 75% Assistive device: Walker-rolling   Wheelchair     Assist Is the patient using a wheelchair?: Yes Type of Wheelchair: Manual     Wheelchair assist level: Total Assistance - Patient < 25%      Wheelchair 50 feet with 2 turns activity    Assist        Assist Level: Total Assistance - Patient < 25%   Wheelchair 150 feet activity     Assist      Assist Level: Total Assistance - Patient < 25%   Blood pressure 123/62, pulse (!) 49, temperature 98.7 F (37.1 C), temperature source Oral, resp. rate 17, weight 68.7 kg, SpO2 96%.  Medical Problem List and Plan: 1. 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  Antithrombotics: -DVT/anticoagulation:  Pharmaceutical: Lovenox             -antiplatelet therapy: Aspirin 81 mg daily and Plavix 75 mg daily x 3 months then aspirin alone 3. Pain Management: Tylenol as needed 4. Mood/Behavior/Sleep: Trazodone 100 mg nightly, Xanax 1 mg twice daily as needed             -antipsychotic agents: N/A  -9/26 Add melatonin for insomnia, does not appear he took his home Xanax last night for anxiety..  Will ask nursing to make sure he knows he can get this if needed.  -9/30 patient is using his nighttime as needed Xanax, reports sleeping better 5. Neuropsych/cognition: This patient is capable of making decisions on his own behalf. 6. Skin/Wound Care: Routine skin checks 7. Fluids/Electrolytes/Nutrition: Routine in and outs with follow-up chemistries 8.  BPH.  Proscar 5 mg daily/Flomax 0.4 mg daily  -9/30 remains continent continue current regimen 9.  Hyperlipidemia.  Lipitor 10.  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  Follow-up outpatient CT chest CTA 11. 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  Constipation.   -9/25 Improved with bowel movement yesterday.  Continue to monitor bowel function -9/27 increase MiraLAX to twice daily, Senokot at night Will give sorbitol 30mL today , pt states that he usually has BM after  coffee  9/30 improved after multiple bowel movements last night 13.  Malaise/fatigue.  COVID-19 test checked and negative  -9/27 reports continued fatigue but appears to be participating with therapy  -9/30 to recheck CBC and BMP tomorrow 14. CKD?   -9/30 Creatinine and BUN have been mildly elevated, recheck labs tomorrow  LOS: 6 days A FACE TO FACE EVALUATION WAS  PERFORMED  Fanny Dance 09/14/2023, 10:07 AM

## 2023-09-15 DIAGNOSIS — M792 Neuralgia and neuritis, unspecified: Secondary | ICD-10-CM

## 2023-09-15 DIAGNOSIS — N189 Chronic kidney disease, unspecified: Secondary | ICD-10-CM | POA: Diagnosis not present

## 2023-09-15 DIAGNOSIS — I6381 Other cerebral infarction due to occlusion or stenosis of small artery: Secondary | ICD-10-CM | POA: Diagnosis not present

## 2023-09-15 DIAGNOSIS — R5381 Other malaise: Secondary | ICD-10-CM | POA: Diagnosis not present

## 2023-09-15 DIAGNOSIS — K5900 Constipation, unspecified: Secondary | ICD-10-CM | POA: Diagnosis not present

## 2023-09-15 DIAGNOSIS — F4321 Adjustment disorder with depressed mood: Secondary | ICD-10-CM

## 2023-09-15 LAB — BASIC METABOLIC PANEL
Anion gap: 5 (ref 5–15)
BUN: 23 mg/dL (ref 8–23)
CO2: 24 mmol/L (ref 22–32)
Calcium: 8.8 mg/dL — ABNORMAL LOW (ref 8.9–10.3)
Chloride: 110 mmol/L (ref 98–111)
Creatinine, Ser: 1.28 mg/dL — ABNORMAL HIGH (ref 0.61–1.24)
GFR, Estimated: 54 mL/min — ABNORMAL LOW (ref >60)
Glucose, Bld: 102 mg/dL — ABNORMAL HIGH (ref 70–99)
Potassium: 4 mmol/L (ref 3.5–5.1)
Sodium: 139 mmol/L (ref 135–145)

## 2023-09-15 LAB — CBC
HCT: 38.4 % — ABNORMAL LOW (ref 39.0–52.0)
Hemoglobin: 12.7 g/dL — ABNORMAL LOW (ref 13.0–17.0)
MCH: 29.8 pg (ref 26.0–34.0)
MCHC: 33.1 g/dL (ref 30.0–36.0)
MCV: 90.1 fL (ref 80.0–100.0)
Platelets: 181 10*3/uL (ref 150–400)
RBC: 4.26 MIL/uL (ref 4.22–5.81)
RDW: 12.9 % (ref 11.5–15.5)
WBC: 5.9 10*3/uL (ref 4.0–10.5)
nRBC: 0 % (ref 0.0–0.2)

## 2023-09-15 MED ORDER — DULOXETINE HCL 30 MG PO CPEP
30.0000 mg | ORAL_CAPSULE | Freq: Every day | ORAL | Status: DC
  Administered 2023-09-15 – 2023-09-20 (×6): 30 mg via ORAL
  Filled 2023-09-15 (×7): qty 1

## 2023-09-15 NOTE — Progress Notes (Signed)
Physical Therapy Session Note  Patient Details  Name: James [name]. MRN: [id number] Date of Birth: 1934/02/13  Today's Date: 09/15/2023 PT Missed Time: 45 Minutes Missed Time Reason: Patient unwilling to participate  Short Term Goals: Week 1:  PT Short Term Goal 1 (Week 1): STG=LTG 2/2 ELOS  Skilled Therapeutic Interventions/Progress Updates:      Pt supine in bed upon arrival. Pt refusing therapy. Pt reports he does not feel well as he has B LE numbness and tingling, and did not sleep well. Pt refusing bed level exercise. Pt refusing need to go to the bathroom.   Pt missed *45 minutes* 2/2 refusal, therapist will attempt to make up as schedule allows.   Pt supine in bed with bed alarm on and needs within reach. Nursing notified.   Therapy Documentation Precautions:  Precautions Precautions: Fall, Other (comment) Precaution Comments: Zio patch, DNR Restrictions Weight Bearing Restrictions: No  Therapy/Group: Individual Therapy  [facility] Ambrose [place], [place], DPT  09/15/2023, 7:57 AM

## 2023-09-15 NOTE — Progress Notes (Signed)
Speech Language Pathology Daily Session Note  Patient Details  Name: James [name]. MRN: [id number] Date of Birth: 17-Jun-1934  Today's Date: 09/15/2023 SLP Individual Time: 4098-1191 SLP Individual Time Calculation (min): 28 min  Short Term Goals: Week 1: SLP Short Term Goal 1 (Week 1): Patient will follow swallowing strategies during consumption of least restrictive diet with supervision multimodal A SLP Short Term Goal 2 (Week 1): Patient will demonstrate ability to problem solve in mildly complex functional situations with mod multimodal A SLP Short Term Goal 3 (Week 1): Patient will recall and utilize memory compensatory strategies with mod multimodalA  Skilled Therapeutic Interventions: SLP conducted skilled therapy session targeting cognitive retraining goals. Patient reports poor sleep, bilateral foot numbness/burning, and overall fatigue and discomfort resulting in decreased participation in therapy sessions overall. Patient and OT report limited participation in morning session, and likewise, patient expressed desire to remain in bed with head lowered and lights dimmed. SLP and patient discussed current swallowing function with patient reporting no overt concerns or difficulty with "light breakfast." MD entered with patient recalling basic environmental events from the morning and the previous evening accurately given supervisionA. MD assessed patient's pain concerns and indicated he would make medication changes to assist. SLP and patient discussed memory log initiated on 9/28. Notably, no memory log entry made from 9/30, thus SLP assisted patient in making entry for this date providing space for patient to add information from upcoming therapy session. Patient interpreted therapy schedule given supervisionA to assist with anticipation of upcoming sessions. Patient oriented to date and SLP switched September calendar to October to facilitate continued orientation during stay. Given  patient's desire to sleep and keep lights off, session was truncated. Patient was left in lowered bed with call bell in reach and bed alarm set. SLP will continue to target goals per plan of care.      Pain Pain Assessment Pain Scale: Faces Faces Pain Scale: Hurts a little bit Pain Type:  (discomfort, numbness, burning bilateral feet/lower legs) Pain Intervention(s): Other (Comment) (MD assessed)  Therapy/Group: Individual Therapy  Jeannie Done, M.A., CCC-SLP  Yetta Barre 09/15/2023, 10:01 AM

## 2023-09-15 NOTE — Plan of Care (Signed)
  Problem: RH Awareness Goal: LTG: Patient will demonstrate awareness during functional activites type of (OT) Description: LTG: Patient will demonstrate awareness during functional activites type of (OT) Flowsheets Taken 09/15/2023 1411 by Isabella Stalling, OT Patient will demonstrate awareness during functional activites type of: Intellectual Taken 09/09/2023 1256 by Mauri Pole, Charisse March, OT LTG: Patient will demonstrate awareness during functional activites type of (OT): Supervision Note: Pt goal adjusted 10/1 due to current cognitive functioning.

## 2023-09-15 NOTE — Progress Notes (Signed)
Occupational Therapy Session Note  Patient Details  Name: James [name]. MRN: [id number] Date of Birth: 1933-12-17  {CHL IP REHAB OT TIME CALCULATIONS:304400400}   Short Term Goals: Week 2:  OT Short Term Goal 1 (Week 2): STGS=LTGs due to patient's length of stay.  Skilled Therapeutic Interventions/Progress Updates:    Patient agreeable to participate in OT session. Reports *** pain level.   Patient participated in skilled OT session focusing on ***. Therapist facilitated/assessed/developed/educated/integrated/elicited *** in order to improve/facilitate/promote    Therapy Documentation Precautions:  Precautions Precautions: Fall, Other (comment) Precaution Comments: Zio patch, DNR Restrictions Weight Bearing Restrictions: No  Therapy/Group: Individual Therapy  [name], OTR/L,CBIS  Supplemental OT - [facility] and [facility] Secure Chat Preferred   09/15/2023, 7:13 PM

## 2023-09-15 NOTE — Progress Notes (Signed)
Occupational Therapy Session Note  Patient Details  Name: James [name]. MRN: [id number] Date of Birth: 08/28/1934  {CHL IP REHAB OT TIME CALCULATIONS:304400400}   Short Term Goals: Week 2:  OT Short Term Goal 1 (Week 2): STGS=LTGs due to patient's length of stay.  Skilled Therapeutic Interventions/Progress Updates:  Pt received *** for skilled OT session with focus on ***. Pt agreeable to interventions, demonstrating overall *** mood. Pt reported ***/10 pain, stating "***" in reference to ***. OT offering intermediate rest breaks and positioning suggestions throughout session to address pain/fatigue and maximize participation/safety in session.    Pt remained *** with all immediate needs met at end of session. Pt continues to be appropriate for skilled OT intervention to promote further functional independence.    Therapy Documentation Precautions:  Precautions Precautions: Fall, Other (comment) Precaution Comments: Zio patch, DNR Restrictions Weight Bearing Restrictions: No   Therapy/Group: Individual Therapy  Lou Cal, OTR/L, MSOT  09/15/2023, 7:09 PM

## 2023-09-15 NOTE — Progress Notes (Addendum)
PROGRESS NOTE   Subjective/Complaints:  Patient reports tingling that began last night in his left hand and left foot.  This sensation is uncomfortable.  He still feels like he has poor energy during the day.  Reports poor sleep last night.  Mood has been decreased.   ROS: Denies fever, chills, shortness of breath, chest pain, abdominal pain, nausea, vomiting, rash, headache + Fatigue and malaise-unchanged Constipation-improved +depression  Objective:   No results found. Recent Labs    09/15/23 0604  WBC 5.9  HGB 12.7*  HCT 38.4*  PLT 181    Recent Labs    09/15/23 0604  NA 139  K 4.0  CL 110  CO2 24  GLUCOSE 102*  BUN 23  CREATININE 1.28*  CALCIUM 8.8*     Intake/Output Summary (Last 24 hours) at 09/15/2023 1015 Last data filed at 09/15/2023 0721 Gross per 24 hour  Intake 948 ml  Output 700 ml  Net 248 ml        Physical Exam: Vital Signs Blood pressure 134/63, pulse 69, temperature 98.3 F (36.8 C), resp. rate 17, weight 68.7 kg, SpO2 97%.   General: No acute distress, lying in bed Affect is flat Heart: Regular rate and rhythm no rubs murmurs or extra sounds Lungs: Clear to auscultation, breathing unlabored, no rales or wheezes Abdomen: Positive bowel sounds, soft nontender to palpation, nondistended Extremities: No clubbing, cyanosis, or edema Skin: No evidence of breakdown, no evidence of rash   Neuro  Mildly dysarthric speech-improving.  Memory deficits present Sensation to light touch intact in all 4 extremities however it is decreased in left upper extremity and left lower extremity  Moving all 4 extremities to gravity and resistance Musculoskeletal: No joint swelling or tenderness noted   Assessment/Plan: 1. Functional deficits which require 3+ hours per day of interdisciplinary therapy in a comprehensive inpatient rehab setting. Physiatrist is providing close team supervision and 24  hour management of active medical problems listed below. Physiatrist and rehab team continue to assess barriers to discharge/monitor patient progress toward functional and medical goals  Care Tool:  Bathing    Body parts bathed by patient: Right arm, Left arm, Chest, Abdomen, Front perineal area, Buttocks, Right upper leg, Left lower leg, Face, Left upper leg   Body parts bathed by helper: Left lower leg     Bathing assist Assist Level: Minimal Assistance - Patient > 75%     Upper Body Dressing/Undressing Upper body dressing   What is the patient wearing?: Pull over shirt    Upper body assist Assist Level: Set up assist    Lower Body Dressing/Undressing Lower body dressing      What is the patient wearing?: Pants, Underwear/pull up     Lower body assist Assist for lower body dressing: Minimal Assistance - Patient > 75%     Toileting Toileting    Toileting assist Assist for toileting: Minimal Assistance - Patient > 75%     Transfers Chair/bed transfer  Transfers assist     Chair/bed transfer assist level: Minimal Assistance - Patient > 75%     Locomotion Ambulation   Ambulation assist      Assist level: 2 helpers (+  2 for B HHA with no AD (for pt comfort 2/2 FOF)) Assistive device: No Device (25 feet with no AD and B HHA, 125 feet with RW and min A) Max distance: 125   Walk 10 feet activity   Assist     Assist level: 2 helpers Assistive device: No Device   Walk 50 feet activity   Assist    Assist level: Minimal Assistance - Patient > 75% Assistive device: Walker-rolling    Walk 150 feet activity   Assist Walk 150 feet activity did not occur: Safety/medical concerns         Walk 10 feet on uneven surface  activity   Assist     Assist level: Minimal Assistance - Patient > 75% Assistive device: Walker-rolling   Wheelchair     Assist Is the patient using a wheelchair?: Yes Type of Wheelchair: Manual    Wheelchair assist  level: Total Assistance - Patient < 25%      Wheelchair 50 feet with 2 turns activity    Assist        Assist Level: Total Assistance - Patient < 25%   Wheelchair 150 feet activity     Assist      Assist Level: Total Assistance - Patient < 25%   Blood pressure 134/63, pulse 69, temperature 98.3 F (36.8 C), resp. rate 17, weight 68.7 kg, SpO2 97%.  Medical Problem List and Plan: 1. 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  Antithrombotics: -DVT/anticoagulation:  Pharmaceutical: Lovenox             -antiplatelet therapy: Aspirin 81 mg daily and Plavix 75 mg daily x 3 months then aspirin alone 3. Pain Management: Tylenol as needed  -Cymbalta 30 mg for left-sided tingling/burning pain 4. Mood/Behavior/Sleep: Trazodone 100 mg nightly, Xanax 1 mg twice daily as needed             -antipsychotic agents: N/A  -9/26 Add melatonin for insomnia, does not appear he took his home Xanax last night for anxiety..  Will ask nursing to make sure he knows he can get this if needed.  -9/29 reports poor sleep last night, did not use his p.m. Xanax  -10/1 will start Cymbalta for depression versus adjustment disorder with depressed mood 5. Neuropsych/cognition: This patient is capable of making decisions on his own behalf. 6. Skin/Wound Care: Routine skin checks 7. Fluids/Electrolytes/Nutrition: Routine in and outs with follow-up chemistries 8.  BPH.  Proscar 5 mg daily/Flomax 0.4 mg daily  -9/30 remains continent continue current regimen 9.  Hyperlipidemia.  Lipitor 10.  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  Follow-up outpatient CT chest CTA 11.  IV infiltration on L AC fossa- warm compresses  -9/25 monitor for improvement 12.  Constipation.   -9/25 Improved with bowel movement yesterday.  Continue to monitor bowel function -9/27 increase MiraLAX to twice daily, Senokot at night Will give sorbitol 30mL today , pt states that he usually has BM after  coffee  9/30 improved after multiple bowel movements last night 13.  Malaise/fatigue.  COVID-19 test checked and negative  -  9/27 reports continued fatigue but appears to be participating with therapy  -10/1 CBC/BMP overall stable 14. CKD?   -10/1  BUN/creatinine 23/1.28.  This has been around recent baseline suspect he may have CKD.  Will encourage fluids orally   LOS: 7 days A FACE TO FACE EVALUATION WAS PERFORMED  Fanny Dance 09/15/2023, 10:15 AM

## 2023-09-16 ENCOUNTER — Inpatient Hospital Stay (HOSPITAL_COMMUNITY): Payer: Medicare Other

## 2023-09-16 DIAGNOSIS — R1310 Dysphagia, unspecified: Secondary | ICD-10-CM

## 2023-09-16 DIAGNOSIS — F411 Generalized anxiety disorder: Secondary | ICD-10-CM

## 2023-09-16 DIAGNOSIS — R5381 Other malaise: Secondary | ICD-10-CM | POA: Diagnosis not present

## 2023-09-16 DIAGNOSIS — I6381 Other cerebral infarction due to occlusion or stenosis of small artery: Secondary | ICD-10-CM | POA: Diagnosis not present

## 2023-09-16 DIAGNOSIS — K5900 Constipation, unspecified: Secondary | ICD-10-CM | POA: Diagnosis not present

## 2023-09-16 DIAGNOSIS — F4321 Adjustment disorder with depressed mood: Secondary | ICD-10-CM | POA: Diagnosis not present

## 2023-09-16 MED ORDER — ALPRAZOLAM 0.5 MG PO TABS
1.0000 mg | ORAL_TABLET | Freq: Every day | ORAL | Status: DC
  Administered 2023-09-16 – 2023-09-19 (×4): 1 mg via ORAL
  Filled 2023-09-16 (×4): qty 2

## 2023-09-16 NOTE — Progress Notes (Signed)
Physical Therapy Weekly Progress Note  Patient Details  Name: James [name]. MRN: [id number] Date of Birth: 08-08-1934  Beginning of progress report period: September 09, 2023 End of progress report period: September 16, 2023  Today's Date: 09/16/2023 PT Individual Time: 4098-1191 PT Individual Time Calculation (min): 59 min   Patient is making great progress towards long term goals. Pt is completing bed mobility with up/mod I, sit to stand and stand pivot transfer and ambulation x 150 + feet, and car transfer with CGA progressing to supervision. Pt requires supervision overall 2/2 cognitive deficits/impulsivity. Pt is ascending/descended 4 steps with B HR and CGA. Discharge scheduled for 10/6. Will complete family training closer to discharge   Patient continues to demonstrate the following deficits muscle weakness, decreased cardiorespiratoy endurance, decreased awareness, decreased problem solving, decreased safety awareness, and decreased memory, and decreased sitting balance, decreased standing balance, and decreased balance strategies and therefore will continue to benefit from skilled PT intervention to increase functional independence with mobility.  Patient progressing toward long term goals..  Continue plan of care.  PT Short Term Goals Week 1:  PT Short Term Goal 1 (Week 1): STG=LTG 2/2 ELOS Week 2:  PT Short Term Goal 1 (Week 2): STG=LTG 2/2 ELOS  Skilled Therapeutic Interventions/Progress Updates:      Pt seated in WC upon arrival. Pt agreeable to therapy. Pt denies any pain.   Pt ambulated from room to main gym with RW, and CGA for stability 2/2 inconsistent step length, impulsivity, and decreased safety with RW, as pt kicking it.   Discussed what equipment pt used at home. Pt describing cane and/or rollator. Therapist opted to trial gait with rollator. Pt ambulated multiple bouts of 150+ feet with rollator and CGA progressing to supervision, verbal cues provided for  slowing down and for safety with RW when performing ambulatory transfer, but pt demos improvement throughout session.   Pt demos and reports improved comfort with rollator in comparison to RW.   Pt picked up cones x6 with rollator and CGA progressing to supervision, verbal cues provided for technique and locking brakes.   Pt weaved in and out of cones spead 6 feet apart with Rollator and CGA/close supervision.   Pt performed seated rest break on rollator x5 throughout session with CGA progressing to supervision, with moderate verbal cues provided for technique.   Pt supine in bed at end of session with all needs within reach and bed alarm on.   Therapy Documentation Precautions:  Precautions Precautions: Fall, Other (comment) Precaution Comments: Zio patch, DNR Restrictions Weight Bearing Restrictions: No  Therapy/Group: Individual Therapy  [facility] Ambrose Finland, [place], DPT  09/16/2023, 7:55 AM

## 2023-09-16 NOTE — Procedures (Signed)
Modified Barium Swallow Study  Patient Details  Name: James [name]. MRN: [id number] Date of Birth: June 21, 1934  Today's Date: 09/16/2023  Modified Barium Swallow completed.  Full report located under Chart Review in the Imaging Section.  History of Present Illness Pt is an 89 year old male with medical hx significant for: anxiety, prior TIA, HTN, hyperlipidemia, h/o lacunar infarction with dysarthria and disequilibrium (08/14/23), polyneuropathy, mild cognitive impairment. Pt presented to [facility] on 09/[facility]/23 d/t left arm weakness and numbness in left hand. Pt not a candidate for TNK. CT head negative for acute abnormalities. MRI revealed right thalamic capsular infarct and two punctate acute infarcts in the right occipital lobe and possible small subacute infarct in left frontal white matter with chronic microvascular ischemic changes. 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 Also noted a descending aortic aneurysm measuring up to 3.5 cm    Clinical Impression Patient presents with mild-moderate oropharyngeal dysphagia. Oral phase is remarkable for reduced lingual control/coordination resulting in posterior spillage to the pyriforms and repetitive/disorganized tongue movement. Pharyngeal phase is characterized by incomplete epiglottic inversion, incomplete laryngeal vestibule closure/elevation, reduced pharyngeal stripping wave and tongue base retraction resulting in penetration of thin and nectar thick liquids and mild diffuse pharyngeal residuals. Penetration of thin liquids were to the level of the vocal cords during consecutive sips, though cleared with cued throat clear. Flash, transient penetration during single sips with thin and NTL. Increased pharyngeal residuals during sips of NTL, however reduced through cued  multiple swallows. Recommend continuation of dysphagia 3 solids and thin liquids via 10cc provale cup. Patient should utilize intermittent throat clear during sips of liquids. Medications may be administered whole in puree. Continuation of standardized swallowing precautions.    Swallow Evaluation Recommendations Recommendations: PO diet PO Diet Recommendation: Dysphagia 3 (Mechanical soft);Thin liquids (Level 0) Liquid Administration via: Other (Comment) (10cc Provale) Medication Administration: Whole meds with puree Supervision: Set-up assistance for safety (Set up thin liquids in 10cc provale) Swallowing strategies  : Minimize environmental distractions;Slow rate;Small bites/sips;Clear throat intermittently Postural changes: Position pt fully upright for meals Oral care recommendations: Oral care BID (2x/day)   Norris Bodley M.A., CF-SLP 09/16/2023,9:53 AM

## 2023-09-16 NOTE — Progress Notes (Addendum)
Patient ID: James Current., male   DOB: August 07, 1934, 89 y.o.   MRN: [id number]  Met with pt and spoke with James [name]-daughter via telephone to inform of team conference update with goals of supervision level due to cognition issues. Working toward discharge date of 10/6. She reports pt's wife is being discharged from [facility] today after being admitted for UTI. Discussed need for family education and she will get with her brother to let worker know which day and the time that works for them so can see Dad's progress and what he needs assistance with. Will await her return call regarding scheduling family training.   3:45 PM Spoke with son to give him the therapy schedule for tomorrow since can come in tomorrow. Will let team know.

## 2023-09-16 NOTE — Progress Notes (Signed)
Speech Language Pathology Weekly Progress and Session Note  Patient Details  Name: James [name]. MRN: [id number] Date of Birth: December 18, 1933  Beginning of progress report period: September 09, 2023 End of progress report period: September 16, 2023  Today's Date: 09/16/2023 SLP Individual Time: 0900-0930 SLP Individual Time Calculation (min): 30 min  Short Term Goals: Week 1: SLP Short Term Goal 1 (Week 1): Patient will follow swallowing strategies during consumption of least restrictive diet with supervision multimodal A SLP Short Term Goal 1 - Progress (Week 1): Met SLP Short Term Goal 2 (Week 1): Patient will demonstrate ability to problem solve in mildly complex functional situations with mod multimodal A SLP Short Term Goal 2 - Progress (Week 1): Met SLP Short Term Goal 3 (Week 1): Patient will recall and utilize memory compensatory strategies with mod multimodalA SLP Short Term Goal 3 - Progress (Week 1): Met    New Short Term Goals: Week 2: SLP Short Term Goal 1 (Week 2): STG = LTG due to ELOS  Weekly Progress Updates: Pt has made excellent gains and has met 3 of 3 STGs this reporting period due to improved swallowing safety and cognition. Currently, patient continues to require supervision A for following swallowing compensatory strategies during consumption of dys3/thin via provale. Patient continues to benefit from minA during recall and utilization of memory and problem solving strategies. Pt/family eduction ongoing. Pt would benefit from continued ST intervention to maximize swallowing safety and cognition in order to maximize functional independence at d/c.    Intensity: Minumum of 1-2 x/day, 30 to 90 minutes Frequency: 1 to 3 out of 7 days Duration/Length of Stay: 10/6 Treatment/Interventions: Cognitive remediation/compensation;Environmental controls;Internal/external aids;Therapeutic Exercise;Cueing hierarchy;Dysphagia/aspiration precaution training;Functional  tasks;Patient/family education;Therapeutic Activities   Daily Session  Skilled Therapeutic Interventions:  See MBS note      Pain Pain Assessment Pain Scale: 0-10 Pain Score: 0-No pain  Therapy/Group: Individual Therapy  Adeoluwa Silvers M.A., CF-SLP 09/16/2023, 12:12 PM

## 2023-09-16 NOTE — Progress Notes (Addendum)
PROGRESS NOTE   Subjective/Complaints:  MBS today. Poor sleep last night, did not get xanax last night. Reports tingling pressure sensation in this feet and LUE that is uncomfortable.    ROS: Denies fever, chills, shortness of breath, chest pain, abdominal pain, nausea, vomiting, rash, headache + Fatigue and malaise-unchanged Constipation-improved +depression +Tingling/Pressure sensation  Objective:   DG Swallowing Func-Speech Pathology  Result Date: 09/16/2023 Table formatting from the original result was not included. Modified Barium Swallow Study Patient Details Name: James [name]. MRN: [id number] Date of Birth: May 18, 1934 Today's Date: 09/16/2023 HPI/PMH: Pt is an 89 year old male with medical hx significant for: anxiety, prior TIA, HTN, hyperlipidemia, h/o lacunar infarction with dysarthria and disequilibrium (08/14/23), polyneuropathy, mild cognitive impairment. Pt presented to [facility] on 09/02/23 d/t left arm weakness and numbness in left hand. Pt not a candidate for TNK. CT head negative for acute abnormalities. MRI revealed right thalamic capsular infarct and two punctate acute infarcts in the right occipital lobe and possible small subacute infarct in left frontal white matter with chronic microvascular ischemic changes. 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 Oral phase is remarkable for reduced lingual control/coordination resulting in posterior spillage to the pyriforms and repetitive/disorganized tongue movement. Pharyngeal phase is characterized by incomplete epiglottic inversion, incomplete laryngeal  vestibule closure/elevation, reduced pharyngeal stripping wave and tongue base retraction resulting in penetration of thin and nectar thick liquids and mild diffuse pharyngeal residuals. Penetration of thin liquids were to the level of the vocal cords during consecutive sips, though cleared with cued throat clear. Flash, transient penetration during single sips with thin and NTL. Increased pharyngeal residuals during sips of NTL, however reduced through cued multiple swallows. Recommend continuation of dysphagia 3 solids and thin liquids via 10cc provale cup. Patient should utilize intermittent throat clear during sips of liquids. Medications may be administered whole in puree. Continuation of standardized swallowing precautions. Factors that may increase risk of adverse event in presence of aspiration Rubye Oaks & [name] 2021): No data recorded Recommendations/Plan: Swallowing Evaluation Recommendations Swallowing Evaluation Recommendations Recommendations: PO diet PO Diet Recommendation: Dysphagia 3 (Mechanical soft); Thin liquids (Level 0) Liquid Administration via: Other (Comment) (10cc Provale) Medication Administration: Whole meds with puree Supervision: Set-up assistance for safety (Set up thin liquids in 10cc provale) Swallowing strategies  : Minimize environmental distractions; Slow rate; Small bites/sips; Clear throat intermittently Postural changes: Position pt fully upright for meals Oral care recommendations: Oral care BID (2x/day) Treatment Plan Treatment Plan Treatment recommendations: Therapy as outlined in treatment plan below Follow-up recommendations: Outpatient SLP Recommendations Comment: general aspiration precautions Functional status assessment: Patient has had a recent decline in their functional status and demonstrates the ability to make significant improvements in function in a reasonable and predictable amount of time. Treatment frequency: Min 2x/week Treatment duration: 2 weeks  Interventions: Aspiration precaution training; Compensatory techniques; Patient/family education; Diet toleration management by SLP Recommendations Recommendations for follow up  therapy are one component of a multi-disciplinary discharge planning process, led by the attending physician.  Recommendations may be updated based on patient status, additional functional criteria and insurance authorization. Assessment: Orofacial Exam: Orofacial Exam Oral Cavity - Dentition: Missing dentition Orofacial Anatomy: WFL Oral Motor/Sensory Function: WFL Anatomy: No data recorded Boluses Administered: Boluses Administered Boluses Administered: Thin liquids (Level 0); Mildly thick liquids (Level 2, nectar thick); Solid; Puree  Oral Impairment Domain: Oral Impairment Domain Lip Closure: No labial escape Tongue control during bolus hold: Posterior escape of less than half of bolus Bolus preparation/mastication: Timely and efficient chewing and mashing Bolus transport/lingual motion: Repetitive/disorganized tongue motion Oral residue: Trace residue lining oral structures Location of oral residue : Tongue Initiation of pharyngeal swallow : Pyriform sinuses  Pharyngeal Impairment Domain: Pharyngeal Impairment Domain Soft palate elevation: No bolus between soft palate (SP)/pharyngeal wall (PW) Laryngeal elevation: Partial superior movement of thyroid cartilage/partial approximation of arytenoids to epiglottic petiole Anterior hyoid excursion: Partial anterior movement Epiglottic movement: Partial inversion Laryngeal vestibule closure: Incomplete, narrow column air/contrast in laryngeal vestibule Pharyngeal stripping wave : Present - diminished Pharyngeal contraction (A/P view only): N/A Pharyngoesophageal segment opening: Complete distension and complete duration, no obstruction of flow Tongue base retraction: Trace column of contrast or air between tongue base and PPW Pharyngeal residue: Collection of residue within or on pharyngeal  structures Location of pharyngeal residue: Diffuse (>3 areas)  Esophageal Impairment Domain: Esophageal Impairment Domain Esophageal clearance upright position: Complete clearance, esophageal coating Pill: Pill Consistency administered: Puree Puree: WFL Penetration/Aspiration Scale Score: Penetration/Aspiration Scale Score 1.  Material does not enter airway: Solid; Pill; Puree 2.  Material enters airway, remains ABOVE vocal cords then ejected out: Mildly thick liquids (Level 2, nectar thick); Thin liquids (Level 0) Compensatory Strategies: Compensatory Strategies Compensatory strategies: Yes Other(comment): Effective (Cued Throat Clear) Effective Other(comment): Thin liquid (Level 0)   General Information: No data recorded No data recorded  No data recorded  No data recorded  No data recorded  No data recorded No data recorded No data recorded No data recorded No data recorded No data recorded Exam Limitations: No limitations Goal Planning: Prognosis for improved oropharyngeal function: Good Barriers to Reach Goals: Cognitive deficits Barriers/Prognosis Comment: advanced age No data recorded Consulted and agree with results and recommendations: Patient Pain: No data recorded End of Session: Start Time:No data recorded Stop Time: No data recorded Time Calculation:No data recorded Charges: No data recorded SLP visit diagnosis: SLP Visit Diagnosis: Cognitive communication deficit (R41.841); Dysphagia, oropharyngeal phase (R13.12) Past Medical History: Past Medical History: Diagnosis Date  Anxiety   TIA (transient ischemic attack)  Past Surgical History: Past Surgical History: Procedure Laterality Date  HAND SURGERY Left   HEMORRHOID SURGERY    TONSILLECTOMY AND ADENOIDECTOMY   Cassidi Sockwell M.A., CF-SLP 09/16/2023, 12:05 PM  Recent Labs    09/15/23 0604  WBC 5.9  HGB 12.7*  HCT 38.4*  PLT 181    Recent Labs    09/15/23 0604  NA 139  K 4.0  CL 110  CO2 24  GLUCOSE 102*  BUN 23  CREATININE 1.28*   CALCIUM 8.8*     Intake/Output Summary (Last 24 hours) at 09/16/2023 1208 Last data filed at 09/16/2023 0442 Gross per 24 hour  Intake --  Output 375 ml  Net -375 ml        Physical Exam: Vital Signs Blood pressure 123/76, pulse (!) 52, temperature 98 F (36.7 C), resp. rate 17, weight (P) 70.2 kg, SpO2 99%.  General: No acute distress, lying in bed Affect is less flat today Heart: Regular rate and rhythm no rubs murmurs or extra sounds Lungs: Clear to auscultation, breathing unlabored, no rales or wheezes Abdomen: Positive bowel sounds, soft nontender to palpation, nondistended Extremities: No clubbing, cyanosis, or edema Skin: No evidence of breakdown, no evidence of rash   Neuro  More alert and awake today. Mildly dysarthric speech-improving.  Memory deficits present. Sensation to light touch intact in all 4 extremities however it is decreased in left upper extremity and left lower extremity  LUE- 4+ out of 5 RLE- 5 out of 5 LLE- HF 4/5; KE 4+/5; DF/PF 5-/5  Musculoskeletal: No joint swelling or tenderness noted   Assessment/Plan: 1. Functional deficits which require 3+ hours per day of interdisciplinary therapy in a comprehensive inpatient rehab setting. Physiatrist is providing close team supervision and 24 hour management of active medical problems listed below. Physiatrist and rehab team continue to assess barriers to discharge/monitor patient progress toward functional and medical goals  Care Tool:  Bathing    Body parts bathed by patient: Right arm, Left arm, Chest, Abdomen, Front perineal area, Buttocks, Right upper leg, Left lower leg, Face, Left upper leg   Body parts bathed by helper: Left lower leg     Bathing assist Assist Level: Minimal Assistance - Patient > 75%     Upper Body Dressing/Undressing Upper body dressing   What is the patient wearing?: Pull over shirt    Upper body assist Assist Level: Set up assist    Lower Body  Dressing/Undressing Lower body dressing      What is the patient wearing?: Pants     Lower body assist Assist for lower body dressing: Contact Guard/Touching assist     Toileting Toileting    Toileting assist Assist for toileting: Minimal Assistance - Patient > 75%     Transfers Chair/bed transfer  Transfers assist     Chair/bed transfer assist level: Minimal Assistance - Patient > 75%     Locomotion Ambulation   Ambulation assist      Assist level: 2 helpers (+2 for B HHA with no AD (for pt comfort 2/2 FOF)) Assistive device: No Device (25 feet with no AD and B HHA, 125 feet with RW and min A) Max distance: 125   Walk 10 feet activity   Assist     Assist level: 2 helpers Assistive device: No Device   Walk 50 feet activity   Assist    Assist level: Minimal Assistance - Patient > 75% Assistive device: Walker-rolling    Walk 150 feet activity   Assist Walk 150 feet activity did not occur: Safety/medical concerns         Walk 10 feet on uneven surface  activity   Assist     Assist level: Minimal Assistance - Patient > 75% Assistive device: Walker-rolling   Wheelchair     Assist Is the patient using a wheelchair?: Yes Type of Wheelchair: Manual    Wheelchair assist level: Total Assistance - Patient < 25%      Wheelchair 50 feet with 2 turns activity    Assist        Assist Level: Total Assistance - Patient < 25%   Wheelchair 150 feet activity     Assist      Assist Level: Total Assistance - Patient < 25%   Blood pressure 123/76, pulse (!) 52, temperature 98 F (36.7 C), resp. rate 17, weight (P) 70.2 kg, SpO2 99%.  Medical Problem List and Plan: 1. Functional deficits secondary to right thalamic capsular infarct and 2 punctate infarcts in the right occipital lobe with a possible small subacute infarct in the left frontal white matter with chronic microvascular ischemic changes             -patient may   shower             -ELOS/Goals: 10-14 days, supervision PT/OT/ST  -Continue CIR  -Day pass  -Team conference today please see physician documentation under team conference tab, met with team  to discuss problems,progress, and goals. Formulized individual treatment plan based on medical history, underlying problem and comorbidities.    2.  Antithrombotics: -DVT/anticoagulation:  Pharmaceutical: Lovenox             -antiplatelet therapy: Aspirin 81 mg daily and Plavix 75 mg daily x 3 months then aspirin alone 3. Pain Management: Tylenol as needed  -Cymbalta 30 mg for left-sided tingling/burning pain, may take weeks for full benefit 4. Mood/Behavior/Sleep: Trazodone 100 mg nightly, Xanax 1 mg twice daily as needed             -antipsychotic agents: N/A  -9/26 Add melatonin for insomnia, does not appear he took his home Xanax last night for anxiety..  Will ask nursing to make sure he knows he can get this if needed.  -9/29 reports poor sleep last night, did not use his p.m. Xanax  -10/1 will start Cymbalta for depression versus adjustment disorder with depressed mood  -10/2 schedule xanax at night, he reports taking this nightly, forgets to ask 5. Neuropsych/cognition: This patient is capable of making decisions on his own behalf. 6. Skin/Wound Care: Routine skin checks 7. Fluids/Electrolytes/Nutrition: Routine in and outs with follow-up chemistries 8.  BPH.  Proscar 5 mg daily/Flomax 0.4 mg daily  -9/30 remains continent continue current regimen 9.  Hyperlipidemia.  Lipitor 10.  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  Follow-up outpatient CT chest CTA 11. IV infiltration on L AC fossa- warm compresses  -improved 12.  Constipation.   -9/25 Improved with bowel movement yesterday.  Continue to monitor bowel function -9/27 increase MiraLAX to twice daily, Senokot at night Will give sorbitol 30mL today , pt states that he usually has BM after  coffee  10/2 LMB  today improved 13.  Malaise/fatigue.  COVID-19 test checked and negative  -9/27 reports continued fatigue but appears to be participating with therapy  -10/1 CBC/BMP overall stable 14. CKD?   -10/1  BUN/creatinine 23/1.28.  This has been around recent baseline suspect he may have CKD.  Will encourage fluids orally 15. Dysphagia   -MBS today, continue SLP. Currently on Dys 3/thin diet    LOS: 8 days A FACE TO FACE EVALUATION WAS PERFORMED  Fanny Dance 09/16/2023, 12:08 PM

## 2023-09-17 DIAGNOSIS — R5381 Other malaise: Secondary | ICD-10-CM | POA: Diagnosis not present

## 2023-09-17 DIAGNOSIS — I6381 Other cerebral infarction due to occlusion or stenosis of small artery: Secondary | ICD-10-CM | POA: Diagnosis not present

## 2023-09-17 DIAGNOSIS — K5900 Constipation, unspecified: Secondary | ICD-10-CM | POA: Diagnosis not present

## 2023-09-17 DIAGNOSIS — F4321 Adjustment disorder with depressed mood: Secondary | ICD-10-CM | POA: Diagnosis not present

## 2023-09-17 MED ORDER — ACETAMINOPHEN 325 MG PO TABS: 650.0000 mg | ORAL_TABLET | Freq: Four times a day (QID) | ORAL | Status: AC | PRN

## 2023-09-17 NOTE — Progress Notes (Signed)
Patient ID: James Current., male   DOB: [birth year], 89 y.o.   MRN: [id number]  Have found the home health agency active with pt it is [facility] have sent a new order to add OT and SP to the existing order. Son is here doing hands on education with father in preparation for discharge on Sunday. Has all needed equipment

## 2023-09-17 NOTE — Progress Notes (Signed)
Occupational Therapy Session Note  Patient Details  Name: James [name]. MRN: [id number] Date of Birth: [birth year]  Today's Date: 09/18/2023 OT Individual Time: 7829-5621 OT Individual Time Calculation (min): 52 min    Short Term Goals: Week 2:  OT Short Term Goal 1 (Week 2): STGS=LTGs due to patient's length of stay.  Skilled Therapeutic Interventions/Progress Updates: Pt received resting in bed for skilled OT session with focus on functional mobility and IADL participation. Pt agreeable to interventions, demonstrating overall pleasant mood. Pt with no reports of pain. OT offering intermediate rest breaks and positioning suggestions throughout session to address pain/fatigue and maximize participation/safety in session.   Pt comes to EOB with Mod I, ambulating into bathroom with close supervision + rollator, changing his brief with Min A due to nature of material.   Pt ambulates to all therapy locations with close supervision + heavy VC for safe use of rollator. In ADL apartment, pt participates in simulated IADL tasks, targeting dynamic standing balance and safe AE management, as patient was tasked with transporting and organizing "grocery items." Pt ambulates around kitchen area with close supervision + rollator, cuing provided for braking rollator and placement around appliances for decreased fall-risk. Pt then performs simple meal prep activity with same level of assistance noted below.   Pt then ambulates back towards day room, making one lap around for general conditioning, all with close supervision.   Pt remained resting in bed with all immediate needs met at end of session. Pt continues to be appropriate for skilled OT intervention to promote further functional independence.   Therapy Documentation Precautions:  Precautions Precautions: Fall, Other (comment) Precaution Comments: Zio patch, DNR, impulsivity Restrictions Weight Bearing Restrictions: No   Therapy/Group:  Individual Therapy  Lou Cal, OTR/L, MSOT  09/18/2023, 1:50 PM

## 2023-09-17 NOTE — Progress Notes (Signed)
Physical Therapy Discharge Summary  Patient Details  Name: James [name]. MRN: [id number] Date of Birth: 1934/05/02  Date of Discharge from PT service:September 19, 2023  Today's Date: 09/19/2023 PT Individual Time: 0805-0915 PT Individual Time Calculation (min): 70 min    Patient has met 8 of 8 long term goals due to improved activity tolerance, improved balance, increased strength, decreased pain, ability to compensate for deficits, improved attention, improved awareness, and improved coordination.  Patient to discharge at an ambulatory level  supervision .   Patient's care partner is independent to provide the necessary cognitive assistance at discharge. Family training completed with son Thayer Ohm) on 09/17/23.   Recommendation:  Patient will benefit from ongoing skilled PT services in outpatient setting to continue to advance safe functional mobility, address ongoing impairments in gait pattern, strength, balance, safety awareness, coordination, and minimize fall risk.  Equipment: Rollator   Reasons for discharge: treatment goals met and discharge from hospital  Patient/family agrees with progress made and goals achieved: Yes   Skilled treatment interventions: Pt presents in room in bed, agreeable to PT, denies pain. Session focused on gait training, NMR for assessing falls risk, therapeutic activity for education on DC as well as transfers, and therapeutic exercise to complete as HEP at DC.  Pt completes bed mobility independently, able to don shoes EOB with supervision. Pt completes all transfers with supervision with and without device. Pt completes BERG 36/56, continues to indicate high fall risk however improved from initial assessment, pt educated on continued fall risk with pt verbalizing understanding. Pt ambulates >300' throughout session between three gyms on unit with supervision only, continues to demonstrate occasional poor L foot clearance however no toe catch or LOB.  Pt ambulates up/down ramp with rollator supervision. Pt completes car transfer with supervision. Pt completes up/down 12 steps with BHRs CGA, decreasred eccentric control noted with descending with RLE however no LOB, alternates steps. Pt then completes 1 set of the following exercises as HEP to complete at home, provided with handout:  Access Code: [email or web address] URL: [email or web address] Date: 09/19/2023 Prepared by: Edwin Cap  Exercises - Side Stepping with Counter Support  - 1 x daily - 7 x weekly - 3 sets - 10 reps - Standing March with Unilateral Counter Support  - 1 x daily - 7 x weekly - 3 sets - 10 reps - Mini Squat with Counter Support  - 1 x daily - 7 x weekly - 3 sets - 10 reps - Heel Raises with Counter Support  - 1 x daily - 7 x weekly - 3 sets - 20 reps - Heel Toe Raises with Counter Support  - 1 x daily - 7 x weekly - 3 sets - 10 reps  Pt returns to room with supervision and returns to supine in bed independently. Pt remains supine with all needs within reach, call light in place at end of session.  PT Discharge Precautions/Restrictions Precautions Precautions: Fall;Other (comment) Precaution Comments: Zio patch, DNR, impulsivity Restrictions Weight Bearing Restrictions: No Pain Interference Pain Interference Pain Effect on Sleep: 1. Rarely or not at all Pain Interference with Therapy Activities: 1. Rarely or not at all Pain Interference with Day-to-Day Activities: 1. Rarely or not at all Vision/Perception  Vision - History Ability to See in Adequate Light: 0 Adequate Perception Perception: Within Functional Limits Praxis Praxis: WFL  Cognition Overall Cognitive Status: Impaired/Different from baseline Arousal/Alertness: Awake/alert Orientation Level: Oriented X4 Focused Attention: Appears intact Sustained Attention: Appears intact Memory: Impaired Memory Impairment:  Decreased recall of new information;Decreased short term memory Awareness:  Appears intact Problem Solving: Impaired Behaviors: Impulsive Safety/Judgment: Impaired Sensation Sensation Light Touch: Impaired Detail Light Touch Impaired Details: Impaired LUE;Impaired LLE Proprioception: Appears Intact Additional Comments: pt reports numbness and tinling B LE L LE> R LE, pt able to detect light touch in all dermatomes bilaterally, reports light touch feels the same B Coordination Gross Motor Movements are Fluid and Coordinated: No Fine Motor Movements are Fluid and Coordinated: No Motor  Motor Motor: Abnormal postural alignment and control  Mobility Bed Mobility Bed Mobility: Rolling Right;Rolling Left;Supine to Sit;Sit to Supine Rolling Right: Independent with assistive device Rolling Left: Independent with assistive device Left Sidelying to Sit: Independent with assistive device Supine to Sit: Independent with assistive device Sit to Supine: Independent with assistive device Transfers Transfers: Sit to Stand;Stand to Sit;Stand Pivot Transfers Sit to Stand: Supervision/Verbal cueing Stand to Sit: Supervision/Verbal cueing Stand Pivot Transfers: Supervision/Verbal cueing Transfer (Assistive device): Rollator Locomotion  Gait Ambulation: Yes Gait Assistance: Supervision/Verbal cueing Gait Distance (Feet): 300 Feet Assistive device: Rollator Gait Assistance Details: Verbal cues for precautions/safety;Verbal cues for safe use of DME/AE Gait Gait: Yes Gait Pattern: Impaired Gait Pattern: Step-to pattern;Decreased step length - left;Decreased step length - right;Trunk flexed;Decreased dorsiflexion - left Stairs / Additional Locomotion Stairs: Yes Stairs Assistance: Minimal Assistance - Patient > 75% Stair Management Technique: Two rails Number of Stairs: 4 Ramp: Contact Guard/touching assist Pick up small object from the floor assist level: Supervision/Verbal cueing Pick up small object from the floor assistive device: rollator Wheelchair  Mobility Wheelchair Mobility: No  Trunk/Postural Assessment  Cervical Assessment Cervical Assessment: Exceptions to [facility] (forward head) Thoracic Assessment Thoracic Assessment: Exceptions to [facility] (rounded shoulders) Lumbar Assessment Lumbar Assessment: Exceptions to [facility] (posterior pelvic tilt) Postural Control Postural Control: Deficits on evaluation Righting Reactions: delayed but improved in comparison to eval Protective Responses: delayed but improved in comparison to eval Postural Limitations: mild forward trunk lean in standing  Balance Balance Balance Assessed: Yes Standardized Balance Assessment Standardized Balance Assessment: Berg Balance Test Berg Balance Test Sit to Stand: Able to stand  independently using hands Standing Unsupported: Able to stand 2 minutes with supervision Sitting with Back Unsupported but Feet Supported on Floor or Stool: Able to sit safely and securely 2 minutes Stand to Sit: Controls descent by using hands Transfers: Able to transfer with verbal cueing and /or supervision Standing Unsupported with Eyes Closed: Able to stand 10 seconds with supervision Standing Ubsupported with Feet Together: Able to place feet together independently and stand for 1 minute with supervision From Standing, Reach Forward with Outstretched Arm: Can reach forward >12 cm safely (5") From Standing Position, Pick up Object from Floor: Able to pick up shoe, needs supervision From Standing Position, Turn to Look Behind Over each Shoulder: Turn sideways only but maintains balance Turn 360 Degrees: Needs close supervision or verbal cueing Standing Unsupported, Alternately Place Feet on Step/Stool: Able to complete 4 steps without aid or supervision Standing Unsupported, One Foot in Front: Able to plae foot ahead of the other independently and hold 30 seconds Standing on One Leg: Tries to lift leg/unable to hold 3 seconds but remains standing independently Total Score: 36 Static  Sitting Balance Static Sitting - Balance Support: No upper extremity supported;Feet supported Static Sitting - Level of Assistance: 7: Independent Dynamic Sitting Balance Dynamic Sitting - Balance Support: During functional activity Dynamic Sitting - Level of Assistance: 5: Stand by assistance Static Standing Balance Static Standing - Balance Support:  During functional activity;Bilateral upper extremity supported Static Standing - Level of Assistance: 5: Stand by assistance (supervision) Dynamic Standing Balance Dynamic Standing - Balance Support: During functional activity Dynamic Standing - Level of Assistance: 5: Stand by assistance (supervision) Extremity Assessment  RLE Assessment RLE Assessment: Exceptions to [facility] General Strength Comments: grossly 4/5 LLE Assessment LLE Assessment: Exceptions to [facility] General Strength Comments: grossly [address] Ambrose Finland, PT, DTaP Edwin Cap PT, DPT  09/17/2023, 12:49 PM

## 2023-09-17 NOTE — Progress Notes (Signed)
Physical Therapy Session Note  Patient Details  Name: James [name]. MRN: [id number] Date of Birth: 01-02-1934  Today's Date: 09/17/2023 PT Individual Time: 0910-1023 PT Individual Time Calculation (min): 73 min   Short Term Goals: Week 2:  PT Short Term Goal 1 (Week 2): STG=LTG 2/2 ELOS  Skilled Therapeutic Interventions/Progress Updates:     Pt supine in bed upon arrival. Pt agreeable to therapy. Pt denies any pain.   Pt son present for [facility].   Education provided for pt ziopatch to be within 10 feet of pt at all times. Education provided for need for 24/7 supervision 2/2 impulsivity and cognitive deficits. Education provided that pt needs to use AD 24/7 to reduce risk of falls. Pt and son verbalized understanding and provided teach back.  Pt son reports he will either provide supervision at pt house or at [facility]. Plans to discuss further with family and Child psychotherapist.   Pt performed sit to stand, stand pivot transfer and gait x300+ feet with Rollator and supervision, gait on unlevel surface outside to simulate pt driveway with CGA, sit to stand from couch with supervision, bed mobiliy on apartment bed with supervision, transfer to rollator for seated rest break with supervision, picking up object off of the floor with rollator and supervision. Verbal cues provided throughout for safety with emphasis on keeping rollator close to pt, heel to toe contact, and locking brakes prior to sitting down on surface, picking up an object. Pt son returned demonstration of supervision on level surfaces and CGA on unlevel surfaces outside, and giving pt appropriate cues when needed for safety/impulsivity.   Pt supine in bed at end of session with bed alarm on and needs within reach.   Therapy Documentation Precautions:  Precautions Precautions: Fall, Other (comment) Precaution Comments: Zio patch, DNR Restrictions Weight Bearing Restrictions:  No  Therapy/Group: Individual Therapy  [facility] [place], [place], DPT  09/17/2023, 7:42 AM

## 2023-09-17 NOTE — Progress Notes (Signed)
Speech Language Pathology Daily Session Note  Patient Details  Name: James [name]. MRN: [id number] Date of Birth: 02-Jul-1934  Today's Date: 09/17/2023 SLP Individual Time: 0800-0900 SLP Individual Time Calculation (min): 60 min  Short Term Goals: Week 2: SLP Short Term Goal 1 (Week 2): STG = LTG due to ELOS  Skilled Therapeutic Interventions: Skilled therapy session focused on family education and cognitive goals. SLP educated family on patients current level of swallowing and cognitive functioning. SLP reviewed results of MBS completed yesterday and explained swallowing anatomy and physiology. Patient/family verbalized understanding of swallowing recommenations and importance of provale 10cc usage w/ consumption of thin liquids. SLP faciliated medication management activity where patient was instructed to fill BID pill box with current medications. Patient with initial mistake placing medication in PM instead of AM, however corrected given minA. For remainder of activity, patient completed box accurately with supervision A. At the end of the session, patient level in bed with alarm set and call bell in reach. MD present. All family and patient questions answered. Continue POC   Pain Pain Assessment Pain Scale: 0-10 Pain Score: 0-No pain  Therapy/Group: Individual Therapy  Roshelle Traub M.A., CF-SLP 09/17/2023, 11:30 AM

## 2023-09-17 NOTE — Progress Notes (Signed)
PROGRESS NOTE   Subjective/Complaints:  Reports pain under control overall, he reports pressure tingling sensation comes and goes.  Slept better last night as Xanax was given.  We reviewed medications.  His son is in the room this morning.  No additional concerns.   ROS: Denies fever, chills, shortness of breath, chest pain, abdominal pain, nausea, vomiting, rash, headache + Fatigue and malaise-little improved today Constipation-improved +depression +Tingling/Pressure sensation in his feet and left hand-controlled overall  Objective:   DG Swallowing Func-Speech Pathology  Result Date: 09/16/2023 Table formatting from the original result was not included. Modified Barium Swallow Study Patient Details Name: James [name]. MRN: [id number] Date of Birth: 1934/08/16 Today's Date: 09/16/2023 HPI/PMH: Pt is an 89 year old male with medical hx significant for: anxiety, prior TIA, HTN, hyperlipidemia, h/o lacunar infarction with dysarthria and disequilibrium (08/14/23), polyneuropathy, mild cognitive impairment. Pt presented to [facility] on 09/02/23 d/t left arm weakness and numbness in left hand. Pt not a candidate for TNK. CT head negative for acute abnormalities. MRI revealed right thalamic capsular infarct and two punctate acute infarcts in the right occipital lobe and possible small subacute infarct in left frontal white matter with chronic microvascular ischemic changes. 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 Oral phase is remarkable for reduced lingual control/coordination resulting in posterior spillage to the pyriforms and  repetitive/disorganized tongue movement. Pharyngeal phase is characterized by incomplete epiglottic inversion, incomplete laryngeal vestibule closure/elevation, reduced pharyngeal stripping wave and tongue base retraction resulting in penetration of thin and nectar thick liquids and mild diffuse pharyngeal residuals. Penetration of thin liquids were to the level of the vocal cords during consecutive sips, though cleared with cued throat clear. Flash, transient penetration during single sips with thin and NTL. Increased pharyngeal residuals during sips of NTL, however reduced through cued multiple swallows. Recommend continuation of dysphagia 3 solids and thin liquids via 10cc provale cup. Patient should utilize intermittent throat clear during sips of liquids. Medications may be administered whole in puree. Continuation of standardized swallowing precautions. Factors that may increase risk of adverse event in presence of aspiration James [name] & James [name] 2021): No data recorded Recommendations/Plan: Swallowing Evaluation Recommendations Swallowing Evaluation Recommendations Recommendations: PO diet PO Diet Recommendation: Dysphagia 3 (Mechanical soft); Thin liquids (Level 0) Liquid Administration via: Other (Comment) (10cc Provale) Medication Administration: Whole meds with puree Supervision: Set-up assistance for safety (Set up thin liquids in 10cc provale) Swallowing strategies  : Minimize environmental distractions; Slow rate; Small bites/sips; Clear throat intermittently Postural changes: Position pt fully upright for meals Oral care recommendations: Oral care BID (2x/day) Treatment Plan Treatment Plan Treatment recommendations: Therapy as outlined in treatment plan below Follow-up recommendations: Outpatient SLP Recommendations Comment: general aspiration precautions Functional status assessment: Patient has had a recent decline in their functional status and demonstrates the ability to make significant improvements  in function in a reasonable and predictable amount of time. Treatment frequency: Min  2x/week Treatment duration: 2 weeks Interventions: Aspiration precaution training; Compensatory techniques; Patient/family education; Diet toleration management by SLP Recommendations Recommendations for follow up therapy are one component of a multi-disciplinary discharge planning process, led by the attending physician.  Recommendations may be updated based on patient status, additional functional criteria and insurance authorization. Assessment: Orofacial Exam: Orofacial Exam Oral Cavity - Dentition: Missing dentition Orofacial Anatomy: WFL Oral Motor/Sensory Function: WFL Anatomy: No data recorded Boluses Administered: Boluses Administered Boluses Administered: Thin liquids (Level 0); Mildly thick liquids (Level 2, nectar thick); Solid; Puree  Oral Impairment Domain: Oral Impairment Domain Lip Closure: No labial escape Tongue control during bolus hold: Posterior escape of less than half of bolus Bolus preparation/mastication: Timely and efficient chewing and mashing Bolus transport/lingual motion: Repetitive/disorganized tongue motion Oral residue: Trace residue lining oral structures Location of oral residue : Tongue Initiation of pharyngeal swallow : Pyriform sinuses  Pharyngeal Impairment Domain: Pharyngeal Impairment Domain Soft palate elevation: No bolus between soft palate (SP)/pharyngeal wall (PW) Laryngeal elevation: Partial superior movement of thyroid cartilage/partial approximation of arytenoids to epiglottic petiole Anterior hyoid excursion: Partial anterior movement Epiglottic movement: Partial inversion Laryngeal vestibule closure: Incomplete, narrow column air/contrast in laryngeal vestibule Pharyngeal stripping wave : Present - diminished Pharyngeal contraction (A/P view only): N/A Pharyngoesophageal segment opening: Complete distension and complete duration, no obstruction of flow Tongue base retraction: Trace  column of contrast or air between tongue base and PPW Pharyngeal residue: Collection of residue within or on pharyngeal structures Location of pharyngeal residue: Diffuse (>3 areas)  Esophageal Impairment Domain: Esophageal Impairment Domain Esophageal James upright position: Complete James, esophageal coating Pill: Pill Consistency administered: Puree Puree: WFL Penetration/Aspiration Scale Score: Penetration/Aspiration Scale Score 1.  Material does not enter airway: Solid; Pill; Puree 2.  Material enters airway, remains ABOVE vocal cords then ejected out: Mildly thick liquids (Level 2, nectar thick); Thin liquids (Level 0) Compensatory Strategies: Compensatory Strategies Compensatory strategies: Yes Other(comment): Effective (Cued Throat Clear) Effective Other(comment): Thin liquid (Level 0)   General Information: No data recorded No data recorded  No data recorded  No data recorded  No data recorded  No data recorded No data recorded No data recorded No data recorded No data recorded No data recorded Exam Limitations: No limitations Goal Planning: Prognosis for improved oropharyngeal function: Good Barriers to Reach Goals: Cognitive deficits Barriers/Prognosis Comment: advanced age No data recorded Consulted and agree with results and recommendations: Patient Pain: No data recorded End of Session: Start Time:No data recorded Stop Time: No data recorded Time Calculation:No data recorded Charges: No data recorded SLP visit diagnosis: SLP Visit Diagnosis: Cognitive communication deficit (R41.841); Dysphagia, oropharyngeal phase (R13.12) Past Medical History: Past Medical History: Diagnosis Date  Anxiety   TIA (transient ischemic attack)  Past Surgical History: Past Surgical History: Procedure Laterality Date  HAND SURGERY Left   HEMORRHOID SURGERY    TONSILLECTOMY AND ADENOIDECTOMY   Cassidi Sockwell M.A., CF-SLP 09/16/2023, 12:05 PM  Recent Labs    09/15/23 0604  WBC 5.9  HGB 12.7*  HCT 38.4*  PLT 181     Recent Labs    09/15/23 0604  NA 139  K 4.0  CL 110  CO2 24  GLUCOSE 102*  BUN 23  CREATININE 1.28*  CALCIUM 8.8*     Intake/Output Summary (Last 24 hours) at 09/17/2023 0941 Last data filed at 09/16/2023 1849 Gross per 24 hour  Intake 300 ml  Output --  Net 300 ml        Physical Exam: Vital  Signs Blood pressure 129/71, pulse 73, temperature 97.9 F (36.6 C), temperature source Oral, resp. rate 16, weight (P) 70.2 kg, SpO2 96%.   General: No acute distress, lying in bed, his son is in the room today Affect less flat than prior days Heart: Regular rate and rhythm no rubs murmurs or extra sounds, Zio monitor on his chest Lungs: Clear to auscultation, breathing unlabored, no rales or wheezes Abdomen: Positive bowel sounds, soft nontender to palpation, nondistended Extremities: No clubbing, cyanosis, or edema Skin: No evidence of breakdown, no evidence of rash   Neuro  More alert and awake today. Mildly dysarthric speech-improving.  Memory deficits present. Sensation to light touch intact in all 4 extremities however it is decreased in left upper extremity and left lower extremity  LUE- 4+ out of 5 RLE- 5 out of 5 LLE- HF 4/5; KE 4+/5; DF/PF 5-/5  Musculoskeletal: No joint swelling or tenderness noted   Assessment/Plan: 1. Functional deficits which require 3+ hours per day of interdisciplinary therapy in a comprehensive inpatient rehab setting. Physiatrist is providing close team supervision and 24 hour management of active medical problems listed below. Physiatrist and rehab team continue to assess barriers to discharge/monitor patient progress toward functional and medical goals  Care Tool:  Bathing    Body parts bathed by patient: Right arm, Left arm, Chest, Abdomen, Front perineal area, Buttocks, Right upper leg, Left lower leg, Face, Left upper leg   Body parts bathed by helper: Left lower leg     Bathing assist Assist Level: Minimal Assistance -  Patient > 75%     Upper Body Dressing/Undressing Upper body dressing   What is the patient wearing?: Pull over shirt    Upper body assist Assist Level: Set up assist    Lower Body Dressing/Undressing Lower body dressing      What is the patient wearing?: Pants     Lower body assist Assist for lower body dressing: Contact Guard/Touching assist     Toileting Toileting    Toileting assist Assist for toileting: Minimal Assistance - Patient > 75%     Transfers Chair/bed transfer  Transfers assist     Chair/bed transfer assist level: Contact Guard/Touching assist     Locomotion Ambulation   Ambulation assist      Assist level: Contact Guard/Touching assist Assistive device: Rollator Max distance: 150+   Walk 10 feet activity   Assist     Assist level: Contact Guard/Touching assist Assistive device: Rollator   Walk 50 feet activity   Assist    Assist level: Contact Guard/Touching assist Assistive device: Rollator    Walk 150 feet activity   Assist Walk 150 feet activity did not occur: Safety/medical concerns  Assist level: Contact Guard/Touching assist Assistive device: Rollator    Walk 10 feet on uneven surface  activity   Assist     Assist level: Contact Guard/Touching assist Assistive device: Rollator   Wheelchair     Assist Is the patient using a wheelchair?: No Type of Wheelchair: Manual    Wheelchair assist level: Total Assistance - Patient < 25%      Wheelchair 50 feet with 2 turns activity    Assist        Assist Level: Total Assistance - Patient < 25%   Wheelchair 150 feet activity     Assist      Assist Level: Total Assistance - Patient < 25%   Blood pressure 129/71, pulse 73, temperature 97.9 F (36.6 C), temperature source Oral, resp.  rate 16, weight (P) 70.2 kg, SpO2 96%.  Medical Problem List and Plan: 1. Functional deficits secondary to right thalamic capsular infarct and 2 punctate  infarcts in the right occipital lobe with a possible small subacute infarct in the left frontal white matter with chronic microvascular ischemic changes             -patient may  shower             -ELOS/Goals: 10-14 days, supervision PT/OT/ST  -Continue CIR  -Day pass  -Estimated discharge 10/6  2.  Antithrombotics: -DVT/anticoagulation:  Pharmaceutical: Lovenox             -antiplatelet therapy: Aspirin 81 mg daily and Plavix 75 mg daily x 3 months then aspirin alone 3. Pain Management: Tylenol as needed  -Cymbalta 30 mg for left-sided tingling/burning pain, may take weeks for full benefit 4. Mood/Behavior/Sleep: Trazodone 100 mg nightly, Xanax 1 mg twice daily as needed             -antipsychotic agents: N/A  -9/26 Add melatonin for insomnia, does not appear he took his home Xanax last night for anxiety..  Will ask nursing to make sure he knows he can get this if needed.  -9/29 reports poor sleep last night, did not use his p.m. Xanax  -10/1 will start Cymbalta for depression versus adjustment disorder with depressed mood  -10/2 schedule xanax at night, he reports taking this nightly, forgets to ask  -10/3 patient slept better with Xanax scheduled, son reports he was often taking 2 of these at night when he was at home to get a sleep. Continue cymbalta for mood and pain 5. Neuropsych/cognition: This patient is capable of making decisions on his own behalf. 6. Skin/Wound Care: Routine skin checks 7. Fluids/Electrolytes/Nutrition: Routine in and outs with follow-up chemistries 8.  BPH.  Proscar 5 mg daily/Flomax 0.4 mg daily  -9/30 remains continent continue current regimen 9.  Hyperlipidemia.  Lipitor 10.  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  Follow-up outpatient CT chest CTA 11. IV infiltration on L AC fossa- warm compresses  -improved 12.  Constipation.   -9/25 Improved with bowel movement yesterday.  Continue to monitor bowel function -9/27 increase  MiraLAX to twice daily, Senokot at night Will give sorbitol 30mL today , pt states that he usually has BM after  coffee  10/3 last BM 10/2 continue to monitor bowel function 13.  Malaise/fatigue.  COVID-19 test checked and negative  -9/27 reports continued fatigue but appears to be participating with therapy  -10/1 CBC/BMP overall stable 14. CKD?   -10/1  BUN/creatinine 23/1.28.  This has been around recent baseline suspect he may have CKD.  Will encourage fluids orally  Recheck tomorrow BMP 15. Dysphagia   -10/3 MBS completed yesterday, he continues on this 3 thin via Provale cup, continue SLP   LOS: 9 days A FACE TO FACE EVALUATION WAS PERFORMED  Fanny Dance 09/17/2023, 9:41 AM

## 2023-09-17 NOTE — Progress Notes (Signed)
Occupational Therapy Session Note  Patient Details  Name: James [name]. MRN: [id number] Date of Birth: [birth year]  Today's Date: 09/17/2023 OT Individual Time: 8657-8469 OT Individual Time Calculation (min): 75 min    Short Term Goals: Week 2:  OT Short Term Goal 1 (Week 2): STGS=LTGs due to patient's length of stay.  Skilled Therapeutic Interventions/Progress Updates:   Pt seen for skilled OT services with focus of session on Family Education with son Thayer Ohm. OT educated initially on need for 24 hr S for safety for functional cog/memory and falls prevention. Use of gait belt and urinal for reducing falls risk for toileting. Thayer Ohm son cleared for hands on amb to and from bathroom, toilet, simulated stall shower with 4" lip to built in seat, standing sink side and overall safety with ADL's and mobility. + teach back demonstrated. B UE HEP with tputty, foam grasp squeeze, cane ex and tband completed for 2x per day. Written UE HEP provided for carryover. Will continue to progress in all aspects of safety with ADL's prior to d/c. Left pt recliner level with needs, nurse call button and chair alarm set with son bedside.   Pain: denies all pain  Therapy Documentation Precautions:  Precautions Precautions: Fall, Other (comment) Precaution Comments: Zio patch, DNR Restrictions Weight Bearing Restrictions: No   Therapy/Group: Individual Therapy  Vicenta Dunning 09/17/2023, 7:38 AM

## 2023-09-18 ENCOUNTER — Other Ambulatory Visit (HOSPITAL_COMMUNITY): Payer: Self-pay

## 2023-09-18 LAB — BASIC METABOLIC PANEL
Anion gap: 9 (ref 5–15)
BUN: 25 mg/dL — ABNORMAL HIGH (ref 8–23)
CO2: 21 mmol/L — ABNORMAL LOW (ref 22–32)
Calcium: 8.7 mg/dL — ABNORMAL LOW (ref 8.9–10.3)
Chloride: 106 mmol/L (ref 98–111)
Creatinine, Ser: 1.27 mg/dL — ABNORMAL HIGH (ref 0.61–1.24)
GFR, Estimated: 55 mL/min — ABNORMAL LOW (ref >60)
Glucose, Bld: 148 mg/dL — ABNORMAL HIGH (ref 70–99)
Potassium: 3.7 mmol/L (ref 3.5–5.1)
Sodium: 136 mmol/L (ref 135–145)

## 2023-09-18 MED ORDER — CLOPIDOGREL BISULFATE 75 MG PO TABS
75.0000 mg | ORAL_TABLET | Freq: Every day | ORAL | 0 refills | Status: DC
  Filled 2023-09-18: qty 30, 30d supply, fill #0

## 2023-09-18 MED ORDER — TRAZODONE HCL 100 MG PO TABS
100.0000 mg | ORAL_TABLET | Freq: Every day | ORAL | 0 refills | Status: DC
  Filled 2023-09-18: qty 30, 30d supply, fill #0

## 2023-09-18 MED ORDER — DULOXETINE HCL 30 MG PO CPEP
30.0000 mg | ORAL_CAPSULE | Freq: Every day | ORAL | 0 refills | Status: DC
  Filled 2023-09-18: qty 30, 30d supply, fill #0

## 2023-09-18 MED ORDER — ALPRAZOLAM 1 MG PO TABS
1.0000 mg | ORAL_TABLET | Freq: Every day | ORAL | 0 refills | Status: DC
  Filled 2023-09-18: qty 30, 30d supply, fill #0

## 2023-09-18 MED ORDER — ATORVASTATIN CALCIUM 40 MG PO TABS
40.0000 mg | ORAL_TABLET | Freq: Every day | ORAL | 0 refills | Status: DC
  Filled 2023-09-18: qty 30, 30d supply, fill #0

## 2023-09-18 MED ORDER — TAMSULOSIN HCL 0.4 MG PO CAPS
0.4000 mg | ORAL_CAPSULE | Freq: Every day | ORAL | 0 refills | Status: DC
  Filled 2023-09-18: qty 30, 30d supply, fill #0

## 2023-09-18 MED ORDER — SENNOSIDES-DOCUSATE SODIUM 8.6-50 MG PO TABS
2.0000 | ORAL_TABLET | Freq: Every day | ORAL | Status: DC
  Administered 2023-09-18 – 2023-09-19 (×2): 2 via ORAL
  Filled 2023-09-18 (×2): qty 2

## 2023-09-18 MED ORDER — FINASTERIDE 5 MG PO TABS
5.0000 mg | ORAL_TABLET | Freq: Every day | ORAL | 0 refills | Status: DC
  Filled 2023-09-18: qty 30, 30d supply, fill #0

## 2023-09-18 NOTE — Progress Notes (Signed)
Occupational Therapy Session Note  Patient Details  Name: Saheed Carrington. MRN: [id number] Date of Birth: January 15, 1934  {CHL IP REHAB OT TIME CALCULATIONS:[id number]}   Short Term Goals: Week 1:  OT Short Term Goal 1 (Week 2): STGS=LTGs due to patient's length of stay.  Skilled Therapeutic Interventions/Progress Updates:    Patient agreeable to participate in OT session. Reports *** pain level.   Patient participated in skilled OT session focusing on ***. Therapist facilitated/assessed/developed/educated/integrated/elicited *** in order to improve/facilitate/promote    Therapy Documentation Precautions:  Precautions Precautions: Fall, Other (comment) Precaution Comments: Zio Patch & DNR Restrictions Weight Bearing Restrictions: No    Therapy/Group: Individual Therapy  [name], OTR/L,CBIS  Supplemental OT - [facility] and [facility] Secure Chat Preferred   09/18/2023, 8:48 PM

## 2023-09-18 NOTE — Progress Notes (Signed)
Speech Language Pathology Daily Session Note  Patient Details  Name: Darwin Rothlisberger. MRN: [id number] Date of Birth: 1934-09-30  Today's Date: 09/18/2023 SLP Individual Time: 1446-1530 SLP Individual Time Calculation (min): 44 min  Short Term Goals: Week 2: SLP Short Term Goal 1 (Week 2): STG = LTG due to ELOS  Skilled Therapeutic Interventions: Skilled therapy session focused on cognitive goals. SLP facilitated session by providing supervision A during completion of check balancing task. Patient initially completed functional calculations by hand, then checked with calculator. Patient required occasional reminders to check his work upon completion of calculations by hand, though utilizing calculator was beneficial in providing awareness of mistakes. SLP addressed memory through education regarding memory strategies and provided patient with a handout for d/c. Patient able to recall WRAP (write, repeat, associate, picture) strategies with modiA. Patient left in bed with alarm set and call bell in reach. Continue POC.    Pain Pain Assessment Pain Scale: 0-10 Pain Score: 0-No pain  Therapy/Group: Individual Therapy  Taylin Mans M.A., CF-SLP 09/18/2023, 3:45 PM

## 2023-09-18 NOTE — Progress Notes (Signed)
Occupational Therapy Session Note  Patient Details  Name: James [name]. MRN: [id number] Date of Birth: July 15, 1934  Today's Date: 09/18/2023 OT Individual Time: 1100-1128 OT Individual Time Calculation (min): 28 min    Short Term Goals: Week 2:  OT Short Term Goal 1 (Week 2): STGS=LTGs due to patient's length of stay.  Skilled Therapeutic Interventions/Progress Updates: Patient received resting in bed. Agreeable to working with OT. Patient able to get OOB and to the rollator without assist. Ambulated with rollator to therapy gym to work on fine motor strength and coordination through table top activities. Patient able to pick up 13 large coins and able to store in left hand before stacking them back along the table. Coordination task using two golf balls, moving clockwise and counter clock wise with finger manipulation. Patient needing increased time to move clockwise, but the fluidity improved with practice. Concluded treatment with walk back to his room with rollator. Patient with good balance and endurance during standing tasks. Continue with skilled OT treatment to achieve LTG's.      Therapy Documentation Precautions:  Precautions Precautions: Fall, Other (comment) Precaution Comments: Zio patch, DNR, impulsivity Restrictions Weight Bearing Restrictions: No  Pain: Pain Assessment Pain Scale: 0-10 Pain Score: 0-No pain ADL: ADL Eating: Supervision/safety Where Assessed-Eating: Bed level Grooming: Supervision/safety Where Assessed-Grooming: Standing at sink Upper Body Bathing: Supervision/safety, Moderate cueing Where Assessed-Upper Body Bathing: Sitting at sink Lower Body Bathing: Supervision/safety, Moderate cueing Where Assessed-Lower Body Bathing: Other (Comment) (toilet sitting/standing) Upper Body Dressing: Minimal assistance Where Assessed-Upper Body Dressing: Chair Lower Body Dressing: Moderate assistance Where Assessed-Lower Body Dressing: Chair Toilet  Transfer: Minimal assistance Toilet Transfer Method: Proofreader: Grab bars (low toilet) Tub/Shower Transfer: Not assessed Film/video editor: Not assessed   Therapy/Group: Individual Therapy  Warnell Forester 09/18/2023, 11:29 AM

## 2023-09-18 NOTE — Progress Notes (Signed)
Patient ID: James [name]., male   DOB: February 15, 1934, 89 y.o.   MRN: [id number]   Spoke with Margaret-daughter via telephone to answer her questions regarding Dad's function and needing to have SBA due to high risk to fall and if independent living id appropriate for he and wife. Discussed they have tiers you can add assist on with and may want to do this. But with Dad's impulsivity he can fall if if in SNF. Son to stay with for two weeks at home and then once [place] from [place] will transition couple to independent living.

## 2023-09-18 NOTE — Progress Notes (Signed)
PROGRESS NOTE   Subjective/Complaints:  No new concerns this morning.  Sleeping better.  Pain is under control.   ROS: Denies fever, chills, shortness of breath, chest pain, abdominal pain, nausea, vomiting, rash, headache Constipation-improved +depression-improved +Tingling/Pressure sensation in his feet and left hand-improved  Objective:   No results found. No results for input(s): "WBC", "HGB", "HCT", "PLT" in the last 72 hours.   Recent Labs    09/18/23 0740  NA 136  K 3.7  CL 106  CO2 21*  GLUCOSE 148*  BUN 25*  CREATININE 1.27*  CALCIUM 8.7*     Intake/Output Summary (Last 24 hours) at 09/18/2023 1318 Last data filed at 09/18/2023 0729 Gross per 24 hour  Intake 756 ml  Output 350 ml  Net 406 ml        Physical Exam: Vital Signs Blood pressure (!) 100/52, pulse 96, temperature 97.9 F (36.6 C), resp. rate 18, weight 70.2 kg, SpO2 96%.   General: No acute distress, lying in bed Pleasant and cooperative Heart: Regular rate and rhythm no rubs murmurs or extra sounds, Zio monitor on his chest Lungs: Clear to auscultation, breathing unlabored, no rales or wheezes Abdomen: Positive bowel sounds, soft nontender to palpation, nondistended Extremities: No clubbing, cyanosis, or edema Skin: No evidence of breakdown, no evidence of rash   Neuro alert and awake.  Follows commands, Sensation to light touch intact in all 4 extremities however it is decreased in left upper extremity and left lower extremity  LUE- 4+ out of 5 RLE- 5 out of 5 LLE- HF 4+/5; KE 4+/5; DF/PF 5-/5  Musculoskeletal: No joint swelling or tenderness noted   Assessment/Plan: 1. Functional deficits which require 3+ hours per day of interdisciplinary therapy in a comprehensive inpatient rehab setting. Physiatrist is providing close team supervision and 24 hour management of active medical problems listed below. Physiatrist and rehab  team continue to assess barriers to discharge/monitor patient progress toward functional and medical goals  Care Tool:  Bathing    Body parts bathed by patient: Right arm, Left arm, Chest, Abdomen, Front perineal area, Buttocks, Right upper leg, Left lower leg, Face, Left upper leg   Body parts bathed by helper: Left lower leg     Bathing assist Assist Level: Minimal Assistance - Patient > 75%     Upper Body Dressing/Undressing Upper body dressing   What is the patient wearing?: Pull over shirt    Upper body assist Assist Level: Set up assist    Lower Body Dressing/Undressing Lower body dressing      What is the patient wearing?: Pants     Lower body assist Assist for lower body dressing: Contact Guard/Touching assist     Toileting Toileting    Toileting assist Assist for toileting: Minimal Assistance - Patient > 75%     Transfers Chair/bed transfer  Transfers assist     Chair/bed transfer assist level: Supervision/Verbal cueing     Locomotion Ambulation   Ambulation assist      Assist level: Supervision/Verbal cueing Assistive device: Rollator Max distance: 300+   Walk 10 feet activity   Assist     Assist level: Supervision/Verbal cueing Assistive device: Rollator  Walk 50 feet activity   Assist    Assist level: Supervision/Verbal cueing Assistive device: Rollator    Walk 150 feet activity   Assist Walk 150 feet activity did not occur: Safety/medical concerns  Assist level: Supervision/Verbal cueing Assistive device: Rollator    Walk 10 feet on uneven surface  activity   Assist     Assist level: Supervision/Verbal cueing Assistive device: Rollator   Wheelchair     Assist Is the patient using a wheelchair?: No Type of Wheelchair: Manual    Wheelchair assist level: Total Assistance - Patient < 25%      Wheelchair 50 feet with 2 turns activity    Assist        Assist Level: Total Assistance - Patient <  25%   Wheelchair 150 feet activity     Assist      Assist Level: Total Assistance - Patient < 25%   Blood pressure (!) 100/52, pulse 96, temperature 97.9 F (36.6 C), resp. rate 18, weight 70.2 kg, SpO2 96%.  Medical Problem List and Plan: 1. Functional deficits secondary to right thalamic capsular infarct and 2 punctate infarcts in the right occipital lobe with a possible small subacute infarct in the left frontal white matter with chronic microvascular ischemic changes             -patient may  shower             -ELOS/Goals: 10-14 days, supervision PT/OT/ST  -Continue CIR  -Day pass  -Patient and family considering independent living  -Estimated discharge 09/20/2023, Sunday  2.  Antithrombotics: -DVT/anticoagulation:  Pharmaceutical: Lovenox             -antiplatelet therapy: Aspirin 81 mg daily and Plavix 75 mg daily x 3 months then aspirin alone 3. Pain Management: Tylenol as needed  -Cymbalta 30 mg for left-sided tingling/burning pain, may take weeks for full benefit 4. Mood/Behavior/Sleep: Trazodone 100 mg nightly, Xanax 1 mg twice daily as needed             -antipsychotic agents: N/A  -9/26 Add melatonin for insomnia, does not appear he took his home Xanax last night for anxiety..  Will ask nursing to make sure he knows he can get this if needed.  -9/29 reports poor sleep last night, did not use his p.m. Xanax  -10/1 will start Cymbalta for depression versus adjustment disorder with depressed mood  -10/2 schedule xanax at night, he reports taking this nightly, forgets to ask  -10/4 patient is sleeping better, mood appears to be increasing as well.  Continue current regimen 5. Neuropsych/cognition: This patient is capable of making decisions on his own behalf. 6. Skin/Wound Care: Routine skin checks 7. Fluids/Electrolytes/Nutrition: Routine in and outs with follow-up chemistries 8.  BPH.  Proscar 5 mg daily/Flomax 0.4 mg daily  -10/4 remains continent, continue current  regimen 9.  Hyperlipidemia.  Lipitor 10.  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  Follow-up outpatient CT chest CTA 11. IV infiltration on L AC fossa- warm compresses  -improved 12.  Constipation.   Last BM 10/2, increase Senokot to 2 tabs at bedtime.  Continue MiraLAX twice daily.  Consider additional medication if no BM today 13.  Malaise/fatigue.  COVID-19 test checked and negative  -9/27 reports continued fatigue but appears to be participating with therapy  -10/1 CBC/BMP overall stable 14. CKD?   -10/4 BUN/creatinine 25/1.27, appears around his new baseline, follow-up with PCP for continued monitoring.  Encourage fluids 15.  Dysphagia   -10/3 MBS completed yesterday, he continues on this 3 thin via Provale cup, continue SLP   LOS: 10 days A FACE TO FACE EVALUATION WAS PERFORMED  Fanny Dance 09/18/2023, 1:18 PM

## 2023-09-18 NOTE — Progress Notes (Signed)
Physical Therapy Session Note  Patient Details  Name: James [name]. MRN: [id number] Date of Birth: 01/02/1934  Today's Date: 09/18/2023 PT Individual Time: 6237-6283 and 1517-6160 PT Individual Time Calculation (min): 55 min and 40 min  Short Term Goals: Week 1:  PT Short Term Goal 1 (Week 1): STG=LTG 2/2 ELOS  Skilled Therapeutic Interventions/Progress Updates:     1st Session: Pt received supine in bed and agrees to therapy. No complaint of pain but reports tingling in toes and fingers. PT provides cues for use of bed features and sequencing and pt performs supine to sit without physical assistance. Pt don shoes without assistance but requires increased time to completes. Stand step transfer from bed to [facility] without AD, with CGA and cues fo rpositioning. WC transport to gym. Pt performs sit to stand to rollator with cues for sequencing, hand placement, initiation, and body mechanics. Pt ambulates x175' with rollator and CGA, with cues for increased strep height and stride length on Lt for symmetrical reciprocal gait pattern. Pt performs Nustep for endurance training and reciprocal coordination training. Pt completes 4x2:30 at workload of 5 with average steps per minute ~50. PT provides cues for hand and foot placement and completing full available ROM. Following, pt completes additional x300' with rollator and same cueing and assistance. Pt left supine with alarm intact and all needs within reach.   2nd Session:  Pt received supine in bed and agrees to therapy. Supine to sit with bed features and cues for positioning. Pt dons shoes at EOB with setup assistance. Pt performs sit to stand with cues for hand placement. Pt ambulates x250' with rollator and cues for posture and increasing Lt stride length. Seated rest break. Pt then performs standing activity in parallel bars to work on strengthening and NMR for Lt hemibody. Pt completes sidesteping with green theraband around distal thighs to  promote increased activation of Lt gluteus medius and abductors. Mirror utilized for [employer], though pt prefers to no look at himself, despite cueing. Pt performs x5' Lt and x5' Rt multiple times with PT providing cueing to maintain tension on theraband by keeping >6" between feet at all times and encouraging small sidesteps. Following rest break, pt complets x25 minisquates with green theraband in place and PT providing cues for body mechanics and optimal sequencing. Seated rest break. Pt then performs x40 alternating thigh taps on parallel bar to promote increase hip flexion activation. Pt then ambulates back to room with rollator and same assistance and cues. Left supine with alarm intact and all needs within reach.    Therapy Documentation Precautions:  Precautions Precautions: Fall, Other (comment) Precaution Comments: Zio patch, DNR, impulsivity Restrictions Weight Bearing Restrictions: No   Therapy/Group: Individual Therapy  Beau Fanny, PT, DPT 09/18/2023, 4:01 PM

## 2023-09-18 NOTE — Progress Notes (Signed)
Occupational Therapy Discharge Summary  Patient Details  Name: James [name]. MRN: [id number] Date of Birth: 16-Feb-1934  Date of Discharge from OT service:September 19, 2023    Patient has met 8 of 8 long term goals due to improved activity tolerance, improved balance, postural control, and ability to compensate for deficits.  Patient to discharge at overall Supervision level.  Patient's care partner is independent to provide the necessary physical and cognitive assistance at discharge.    Reasons goals not met: NA  Recommendation:  Patient will benefit from ongoing skilled OT services in home health setting to continue to advance functional skills in the area of BADL, iADL, and Reduce care partner burden.  Equipment: No equipment provided  Reasons for discharge: treatment goals met and discharge from hospital  Patient/family agrees with progress made and goals achieved: Yes  OT Discharge Precautions/Restrictions  Precautions Precautions: Fall;Other (comment) Precaution Comments: Zio Patch & DNR Restrictions Weight Bearing Restrictions: No ADL ADL Eating: Supervision/safety Where Assessed-Eating: Bed level Grooming: Supervision/safety Where Assessed-Grooming: Standing at sink Upper Body Bathing: Supervision/safety Where Assessed-Upper Body Bathing: Sitting at sink Lower Body Bathing: Supervision/safety Where Assessed-Lower Body Bathing: Sitting at sink, Standing at sink Upper Body Dressing: Supervision/safety Where Assessed-Upper Body Dressing: Edge of bed Lower Body Dressing: Supervision/safety Where Assessed-Lower Body Dressing: Edge of bed Toileting: Supervision/safety Where Assessed-Toileting: Teacher, adult education: Close supervision Toilet Transfer Method: Proofreader: Teacher, early years/pre) Tub/Shower Transfer: Not assessed Film/video editor: Close supervision Film/video editor Method: Manufacturing systems engineer: Other (comment) (Built-in seat & rollator) Vision Baseline Vision/History: 1 Wears glasses Patient Visual Report: No change from baseline Vision Assessment?: Wears glasses for reading Perception  Perception: Within Functional Limits Praxis Praxis: WFL Cognition Cognition Overall Cognitive Status: Impaired/Different from baseline Arousal/Alertness: Awake/alert Orientation Level: Person;Place;Situation Memory: Appears intact Memory Impairment: Decreased recall of new information Awareness: Appears intact Problem Solving: Appears intact Reasoning: Appears intact Sequencing: Appears intact Behaviors: Restless Safety/Judgment: Impaired Brief Interview for Mental Status (BIMS) Repetition of Three Words (First Attempt): 3 Temporal Orientation: Year: Correct Temporal Orientation: Month: Accurate within 5 days Temporal Orientation: Day: Correct Recall: "Sock": Yes, no cue required Recall: "Blue": Yes, no cue required Recall: "Bed": Yes, after cueing ("a piece of furniture") BIMS Summary Score: 14 Sensation Sensation Light Touch: Impaired Detail Light Touch Impaired Details: Impaired LUE;Impaired LLE Hot/Cold: Appears Intact Proprioception: Appears Intact Additional Comments: Pt reports numbness/tingling in B LE (L>R), pt able to detect light touch in all dermatomes bilaterally, reports light touch feels the same B. Coordination Gross Motor Movements are Fluid and Coordinated: No Fine Motor Movements are Fluid and Coordinated: No Coordination and Movement Description: Deficits due to generalized weakness and sensory impairements. Motor  Motor Motor: Abnormal postural alignment and control Mobility  Bed Mobility Bed Mobility: Rolling Right;Rolling Left;Supine to Sit;Sit to Supine Rolling Right: Independent with assistive device Rolling Left: Independent with assistive device Left Sidelying to Sit: Independent with assistive device Supine to Sit: Independent with  assistive device Sit to Supine: Independent with assistive device Transfers Sit to Stand: Supervision/Verbal cueing Stand to Sit: Supervision/Verbal cueing  Trunk/Postural Assessment  Cervical Assessment Cervical Assessment: Exceptions to [facility] (forward head) Thoracic Assessment Thoracic Assessment: Exceptions to [facility] (rounded shoulders) Lumbar Assessment Lumbar Assessment: Exceptions to [facility] (posterior pelvic tilt) Postural Control Postural Control: Deficits on evaluation Righting Reactions: delayed but improved in comparison to eval Protective Responses: delayed but improved in comparison to eval Postural Limitations: mild forward trunk lean in standing  Balance  Balance Balance Assessed: Yes Static Sitting Balance Static Sitting - Balance Support: No upper extremity supported;Feet supported Static Sitting - Level of Assistance: 5: Stand by assistance (Supervision) Dynamic Sitting Balance Dynamic Sitting - Balance Support: During functional activity Dynamic Sitting - Level of Assistance: 5: Stand by assistance (Supervision) Dynamic Sitting - Balance Activities: Lateral lean/weight shifting;Forward lean/weight shifting;Reaching for objects;Reaching across midline Static Standing Balance Static Standing - Balance Support: During functional activity;Bilateral upper extremity supported Static Standing - Level of Assistance: 5: Stand by assistance (Supervision) Dynamic Standing Balance Dynamic Standing - Balance Support: During functional activity Dynamic Standing - Level of Assistance: 5: Stand by assistance (Supervision) Dynamic Standing - Balance Activities: Lateral lean/weight shifting;Forward lean/weight shifting;Reaching across midline;Reaching for objects Extremity/Trunk Assessment RUE Assessment RUE Assessment: Exceptions to [facility] Active Range of Motion (AROM) Comments: WFL in all ranges. General Strength Comments: 4+/5 shoulder, elbow ranges. Functional gross grasp. LUE  Assessment LUE Assessment: Exceptions to [facility] Active Range of Motion (AROM) Comments: WFL in all ranges. General Strength Comments: 4/5 shoulder flexion/abduction, 5/5 ir, elbow flexion/extension. Functional gross grasp although weaker than right.  Discharge levels provided by: Lou Cal, OTR/L, MSOT 09/18/2023, 4:11 PM  Additional information related to cognition completed by: [name], OTR/L,CBIS  Supplemental OT - [facility] Secure Chat Preferred  09/19/23

## 2023-09-18 NOTE — Progress Notes (Signed)
Inpatient Rehabilitation Care Coordinator Discharge Note DC SUNDAY 10/6  Patient Details  Name: James [name]. MRN: [id number] Date of Birth: 05-30-1934   Discharge location: HOME WITH WIFE AND SON TO ATAY WITH FOR TWO WEEKS AND THEN MOVE INTO INDEPENDENT LIVING-[place]  Length of Stay: 12 DAYS  Discharge activity level: SUPERVISION/SBA LEVEL  Home/community participation: ACTIVE  Patient response HY:QMVHQI Literacy - How often do you need to have someone help you when you read instructions, pamphlets, or other written material from your doctor or pharmacy?: Never  Patient response ON:GEXBMW Isolation - How often do you feel lonely or isolated from those around you?: Never  Services provided included: MD, RD, PT, OT, SLP, RN, CM, TR, Pharmacy, SW  Financial Services:  Field seismologist Utilized: Private Insurance [employer]  Choices offered to/list presented to: PT AND SON  Follow-up services arranged:  Home Health, Patient/Family request agency HH/DME Home Health Agency: [facility]  PT  OT  SP    DME : HAS ALL NEEDED EQUIPMENT HH/DME Requested Agency: [facility]  Patient response to transportation need: Is the patient able to respond to transportation needs?: Yes In the past 12 months, has lack of transportation kept you from medical appointments or from getting medications?: No In the past 12 months, has lack of transportation kept you from meetings, work, or from getting things needed for daily living?: No   Patient/Family verbalized understanding of follow-up arrangements:  Yes  Individual responsible for coordination of the follow-up plan: NARGARET-DAUGHTER [phone]  Confirmed correct DME delivered: Lucy Chris 09/18/2023    Comments (or additional information):SON WAS HERE FOR HANDS ON FAMILY EDUCATION AWARE OF DAD'S HIGH RISK TO FALL DUE TO IMPULSIVE AND SAFETY AWARENESS ISSUES. WAS FALLING PRIOR TO ADMISSION. PLAN  TO MOVE INTO INDEPENDENT LIVING AND EVENTUALLY TO ASSISTED LIVING.   Summary of Stay    Date/Time Discharge Planning CSW  09/16/23 0900 Family coming up with a plan for both pt and his wife-three children involved and are aware he will need supervision at discharge. RGD  09/09/23 0957 HOme with wife who is currently getting chemo for bladder cancer-three children involved and will assist. New evaluation today RGD       Dara Beidleman, Lemar Livings

## 2023-09-18 NOTE — Plan of Care (Signed)
  Problem: Consults Goal: RH STROKE PATIENT EDUCATION Description: See Patient Education module for education specifics  Outcome: Progressing   Problem: RH BOWEL ELIMINATION Goal: RH STG MANAGE BOWEL WITH ASSISTANCE Description: STG Manage Bowel with toileting Assistance. Outcome: Progressing Goal: RH STG MANAGE BOWEL W/MEDICATION W/ASSISTANCE Description: STG Manage Bowel with Medication with mod I Assistance. Outcome: Progressing   Problem: RH BLADDER ELIMINATION Goal: RH STG MANAGE BLADDER WITH ASSISTANCE Description: STG Manage Bladder With toileting Assistance Outcome: Progressing Goal: RH STG MANAGE BLADDER WITH MEDICATION WITH ASSISTANCE Description: STG Manage Bladder With Medication With mod I  Assistance. Outcome: Progressing   Problem: RH SAFETY Goal: RH STG ADHERE TO SAFETY PRECAUTIONS W/ASSISTANCE/DEVICE Description: STG Adhere to Safety Precautions With cues Assistance/Device. Outcome: Progressing   Problem: RH KNOWLEDGE DEFICIT Goal: RH STG INCREASE KNOWLEDGE OF HYPERTENSION Description: Patient and family will be able to manage HTN with medications and dietary modifications using educational resources independently Outcome: Progressing Goal: RH STG INCREASE KNOWLEGDE OF HYPERLIPIDEMIA Description: Patient and family will be able to manage HLD with medications and dietary modifications using educational resources independently Outcome: Progressing Goal: RH STG INCREASE KNOWLEDGE OF STROKE PROPHYLAXIS Description: Patient and family will be able to manage secondary risks with medications and dietary modifications using educational resources independently Outcome: Progressing

## 2023-09-19 NOTE — Progress Notes (Signed)
Inpatient Rehabilitation Discharge Medication Review by a Pharmacist  A complete drug regimen review was completed for this patient to identify any potential clinically significant medication issues.  High Risk Drug Classes Is patient taking? Indication by Medication  Antipsychotic {Receiving?:26196}   Anticoagulant {Receiving?:26196}   Antibiotic {Receiving?:26196}   Opioid {Receiving?:26196}   Antiplatelet {Receiving?:26196} Aspirin, Plavix: CVA ppx (x 3 months, then aspirin alone) Started 09/04/23 >>12/04/23  Hypoglycemics/insulin {Yes or No?:26198}   Vasoactive Medication {Receiving?:26196}   Chemotherapy {Receiving Chemo?:26197}   Other {Yes or No?:26198} Tylenol: pain Lipitor: hyperlipidemia Alprazolam: anxiety Duloxetine: anxiety/sleep Trazodone: sleep Miralax: constipation Finasteride: BPH Flomax: BPH     Type of Medication Issue Identified Description of Issue Recommendation(s)  Drug Interaction(s) (clinically significant)     Duplicate Therapy     Allergy     No Medication Administration End Date     Incorrect Dose     Additional Drug Therapy Needed     Significant med changes from prior encounter (inform family/care partners about these prior to discharge).  Restart or discontinue as appropriate. Communicate medication changes with patient/family at discharge  Other       Clinically significant medication issues were identified that warrant physician communication and completion of prescribed/recommended actions by midnight of the next day:  {Yes or No?:26198}  Name of provider notified for urgent issues identified: ***   Provider Method of Notification: ***    Pharmacist comments: ***   Time spent performing this drug regimen review (minutes): 30   Thank you for allowing pharmacy to be a part of this patient's care.   Signe Colt, PharmD 09/19/2023 11:50 AM    **Pharmacist phone directory can be found on [email or web address] listed under [facility]  Pharmacy**

## 2023-09-19 NOTE — Plan of Care (Signed)
  Problem: RH Swallowing Goal: LTG Patient will consume least restrictive diet using compensatory strategies with assistance (SLP) Description: LTG:  Patient will consume least restrictive diet using compensatory strategies with assistance (SLP) Outcome: Completed/Met   Problem: RH Problem Solving Goal: LTG Patient will demonstrate problem solving for (SLP) Description: LTG:  Patient will demonstrate problem solving for basic/complex daily situations with cues  (SLP) Outcome: Completed/Met   Problem: RH Memory Goal: LTG Patient will use memory compensatory aids to (SLP) Description: LTG:  Patient will use memory compensatory aids to recall biographical/new, daily complex information with cues (SLP) Outcome: Completed/Met

## 2023-09-19 NOTE — Progress Notes (Signed)
Speech Language Pathology Discharge Summary  Patient Details  Name: James [name]. MRN: [id number] Date of Birth: 08-03-1934  Date of Discharge from SLP service:September 19, 2023  Today's Date: 09/19/2023 SLP Individual Time: 1330-1430 SLP Individual Time Calculation (min): 60 min   Skilled Therapeutic Interventions:   Skilled therapy session focused on re-evaluation via Cognistat. Patient scored WFL on all subtest's with the exception of mild deficits in registration. This re-assessment demonstrates improvement since admission in the areas of registration and visual construction (both of which patient scored with moderate deficits at initial evaluation). SLP continued to address goals through completion of memory book and providing modiA during abstract card game task to target problem solving and memory goals. Patient left in chair with alarm set and call bell in reach. Continue POC.     Patient has met 3 of 3 long term goals.  Patient to discharge at overall Modified Independent;Supervision level.  Reasons goals not met: n/a   Clinical Impression/Discharge Summary: Pt has made excellent gains and has met 3 of 3 LTG's this admission due to improved cognition and swallowing safety. Pt is currently an overall supervision A for cognitive tasks and requires modiA cues for utilization of swallowing compensatory strategies to minimize overt s/sx of aspiration with dysphagia 3/thin liquids via 10cc Provale. Pt/family education complete and pt will discharge to independent living facility with supervision from friends/family/etc. Pt would benefit from  f/u ST services to maximize cognitive skills and monitor dysphagia in order to maximize functional independence.   Care Partner:  Caregiver Able to Provide Assistance: Yes  Type of Caregiver Assistance: Physical;Cognitive  Recommendation:  Home Health SLP;Outpatient SLP  Rationale for SLP Follow Up: Maximize cognitive function and  independence;Maximize swallowing safety   Equipment: Provale 10cc   Reasons for discharge: Discharged from hospital;Treatment goals met   Patient/Family Agrees with Progress Made and Goals Achieved: Yes    Leen Tworek M.A., CF-SLP 09/19/2023, 12:35 PM

## 2023-09-19 NOTE — Plan of Care (Signed)
  Problem: RH Balance Goal: LTG Patient will maintain dynamic sitting balance (PT) Description: LTG:  Patient will maintain dynamic sitting balance with assistance during mobility activities (PT) Outcome: Completed/Met Goal: LTG Patient will maintain dynamic standing balance (PT) Description: LTG:  Patient will maintain dynamic standing balance with assistance during mobility activities (PT) Outcome: Completed/Met   Problem: Sit to Stand Goal: LTG:  Patient will perform sit to stand with assistance level (PT) Description: LTG:  Patient will perform sit to stand with assistance level (PT) Outcome: Completed/Met   Problem: RH Bed Mobility Goal: LTG Patient will perform bed mobility with assist (PT) Description: LTG: Patient will perform bed mobility with assistance, with/without cues (PT). Outcome: Completed/Met   Problem: RH Bed to Chair Transfers Goal: LTG Patient will perform bed/chair transfers w/assist (PT) Description: LTG: Patient will perform bed to chair transfers with assistance (PT). Outcome: Completed/Met   Problem: RH Car Transfers Goal: LTG Patient will perform car transfers with assist (PT) Description: LTG: Patient will perform car transfers with assistance (PT). Outcome: Completed/Met   Problem: RH Ambulation Goal: LTG Patient will ambulate in controlled environment (PT) Description: LTG: Patient will ambulate in a controlled environment, # of feet with assistance (PT). Outcome: Completed/Met Goal: LTG Patient will ambulate in home environment (PT) Description: LTG: Patient will ambulate in home environment, # of feet with assistance (PT). Outcome: Completed/Met   

## 2023-09-19 NOTE — Progress Notes (Signed)
PROGRESS NOTE   Subjective/Complaints:  Pt doing well, slept well, denies pain, LBM 2 days ago per pt (per report it was 10/2) but states this is normal for him. Urinating fine. Denies any other complaints or concerns. Ready to go home tomorrow!   ROS: Denies fever, chills, shortness of breath, chest pain, abdominal pain, nausea, vomiting, rash, headache Constipation-improved/baseline +depression-improved +Tingling/Pressure sensation in his feet and left hand-improved  Objective:   No results found. No results for input(s): "WBC", "HGB", "HCT", "PLT" in the last 72 hours.   Recent Labs    09/18/23 0740  NA 136  K 3.7  CL 106  CO2 21*  GLUCOSE 148*  BUN 25*  CREATININE 1.27*  CALCIUM 8.7*     Intake/Output Summary (Last 24 hours) at 09/19/2023 1158 Last data filed at 09/19/2023 0954 Gross per 24 hour  Intake 700 ml  Output 575 ml  Net 125 ml        Physical Exam: Vital Signs Blood pressure 122/61, pulse 79, temperature 97.8 F (36.6 C), resp. rate 19, weight 70.2 kg, SpO2 98%.   General: No acute distress, lying in bed resting comfortably, a little HOH Pleasant and cooperative Heart: Regular rate and rhythm no rubs murmurs or extra sounds, Zio monitor on his chest Lungs: Clear to auscultation, breathing unlabored, no rales or wheezes Abdomen: Positive bowel sounds, soft nontender to palpation, nondistended Extremities: No clubbing, cyanosis, or edema Skin: No evidence of breakdown, no evidence of rash  PRIOR EXCAMS: Neuro alert and awake.  Follows commands, Sensation to light touch intact in all 4 extremities however it is decreased in left upper extremity and left lower extremity  LUE- 4+ out of 5 RLE- 5 out of 5 LLE- HF 4+/5; KE 4+/5; DF/PF 5-/5  Musculoskeletal: No joint swelling or tenderness noted   Assessment/Plan: 1. Functional deficits which require 3+ hours per day of interdisciplinary  therapy in a comprehensive inpatient rehab setting. Physiatrist is providing close team supervision and 24 hour management of active medical problems listed below. Physiatrist and rehab team continue to assess barriers to discharge/monitor patient progress toward functional and medical goals  Care Tool:  Bathing    Body parts bathed by patient: Right arm, Left arm, Chest, Abdomen, Front perineal area, Buttocks, Right upper leg, Left lower leg, Face, Left upper leg, Right lower leg   Body parts bathed by helper: Left lower leg     Bathing assist Assist Level: Supervision/Verbal cueing     Upper Body Dressing/Undressing Upper body dressing   What is the patient wearing?: Pull over shirt    Upper body assist Assist Level: Supervision/Verbal cueing    Lower Body Dressing/Undressing Lower body dressing      What is the patient wearing?: Pants, Underwear/pull up     Lower body assist Assist for lower body dressing: Supervision/Verbal cueing     Toileting Toileting    Toileting assist Assist for toileting: Supervision/Verbal cueing     Transfers Chair/bed transfer  Transfers assist     Chair/bed transfer assist level: Supervision/Verbal cueing     Locomotion Ambulation   Ambulation assist      Assist level: Supervision/Verbal cueing Assistive device:  Rollator Max distance: 300+   Walk 10 feet activity   Assist     Assist level: Supervision/Verbal cueing Assistive device: Rollator   Walk 50 feet activity   Assist    Assist level: Supervision/Verbal cueing Assistive device: Rollator    Walk 150 feet activity   Assist Walk 150 feet activity did not occur: Safety/medical concerns  Assist level: Supervision/Verbal cueing Assistive device: Rollator    Walk 10 feet on uneven surface  activity   Assist     Assist level: Supervision/Verbal cueing Assistive device: Rollator   Wheelchair     Assist Is the patient using a wheelchair?:  No Type of Wheelchair: Manual    Wheelchair assist level: Total Assistance - Patient < 25%      Wheelchair 50 feet with 2 turns activity    Assist        Assist Level: Total Assistance - Patient < 25%   Wheelchair 150 feet activity     Assist      Assist Level: Total Assistance - Patient < 25%   Blood pressure 122/61, pulse 79, temperature 97.8 F (36.6 C), resp. rate 19, weight 70.2 kg, SpO2 98%.  Medical Problem List and Plan: 1. Functional deficits secondary to right thalamic capsular infarct and 2 punctate infarcts in the right occipital lobe with a possible small subacute infarct in the left frontal white matter with chronic microvascular ischemic changes             -patient may  shower             -ELOS/Goals: 10-14 days, supervision PT/OT/ST  -Continue CIR  -Day pass  -Patient and family considering independent living  -Estimated discharge 09/20/2023, Sunday  2.  Antithrombotics: -DVT/anticoagulation:  Pharmaceutical: Lovenox             -antiplatelet therapy: Aspirin 81 mg daily and Plavix 75 mg daily x 3 months then aspirin alone 3. Pain Management: Tylenol as needed  -Cymbalta 30 mg for left-sided tingling/burning pain, may take weeks for full benefit 4. Mood/Behavior/Sleep: Trazodone 100 mg nightly, Xanax 1 mg twice daily as needed             -antipsychotic agents: N/A  -9/26 Add melatonin for insomnia, does not appear he took his home Xanax last night for anxiety..  Will ask nursing to make sure he knows he can get this if needed.  -9/29 reports poor sleep last night, did not use his p.m. Xanax  -10/1 will start Cymbalta for depression versus adjustment disorder with depressed mood  -10/2 schedule xanax at night, he reports taking this nightly, forgets to ask  -10/4 patient is sleeping better, mood appears to be increasing as well.  Continue current regimen 5. Neuropsych/cognition: This patient is capable of making decisions on his own behalf. 6.  Skin/Wound Care: Routine skin checks 7. Fluids/Electrolytes/Nutrition: Routine in and outs with follow-up chemistries 8.  BPH.  Proscar 5 mg daily/Flomax 0.4 mg daily  -10/4 remains continent, continue current regimen 9.  Hyperlipidemia.  Lipitor 10.  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  Follow-up outpatient CT chest CTA 11. IV infiltration on L AC fossa- warm compresses  -improved 12.  Constipation.   Last BM 10/2, increase Senokot to 2 tabs at bedtime.  Continue MiraLAX twice daily.  Consider additional medication if no BM today -09/19/23 LBM 2-3 days ago, but pt states this is normal for him, since he's scheduled to leave tomorrow, asked nursing to give  sorbitol today if no BM by 2-3pm.  13.  Malaise/fatigue.  COVID-19 test checked and negative  -9/27 reports continued fatigue but appears to be participating with therapy  -10/1 CBC/BMP overall stable 14. CKD?   -10/4 BUN/creatinine 25/1.27, appears around his new baseline, follow-up with PCP for continued monitoring.  Encourage fluids 15. Dysphagia   -10/3 MBS completed yesterday, he continues on this 3 thin via Provale cup, continue SLP   LOS: 11 days A FACE TO FACE EVALUATION WAS PERFORMED  [address] 09/19/2023, 11:58 AM

## 2023-09-19 NOTE — Plan of Care (Signed)
Continue current care plan 

## 2023-09-20 MED ORDER — SENNOSIDES-DOCUSATE SODIUM 8.6-50 MG PO TABS: 2.0000 | ORAL_TABLET | Freq: Every day | ORAL | Status: DC

## 2023-09-20 NOTE — Progress Notes (Signed)
PROGRESS NOTE   Subjective/Complaints:  Pt doing well and ready to go home! Slept well, denies pain, LBM yesterday. Initially hadn't urinated but once he stood up, he's now urinating fine. Denies any other complaints or concerns.    ROS: Denies fever, chills, shortness of breath, chest pain, abdominal pain, nausea, vomiting, rash, headache Constipation-improved +depression-improved +Tingling/Pressure sensation in his feet and left hand-improved  Objective:   No results found. No results for input(s): "WBC", "HGB", "HCT", "PLT" in the last 72 hours.   Recent Labs    09/18/23 0740  NA 136  K 3.7  CL 106  CO2 21*  GLUCOSE 148*  BUN 25*  CREATININE 1.27*  CALCIUM 8.7*     Intake/Output Summary (Last 24 hours) at 09/20/2023 0952 Last data filed at 09/20/2023 0713 Gross per 24 hour  Intake 1180 ml  Output 725 ml  Net 455 ml        Physical Exam: Vital Signs Blood pressure 116/65, pulse 78, temperature 98.1 F (36.7 C), resp. rate 18, weight 70.2 kg, SpO2 96%.   General: No acute distress, lying in bed resting comfortably, a little HOH Pleasant and cooperative Heart: Regular rate and rhythm no rubs murmurs or extra sounds, Zio monitor on his chest Lungs: Clear to auscultation, breathing unlabored, no rales or wheezes Abdomen: Positive bowel sounds, soft nontender to palpation, nondistended Extremities: No clubbing, cyanosis, or edema Skin: No evidence of breakdown, no evidence of rash MsK: moving all extremities antegravity  PRIOR EXCAMS: Neuro alert and awake.  Follows commands, Sensation to light touch intact in all 4 extremities however it is decreased in left upper extremity and left lower extremity  LUE- 4+ out of 5 RLE- 5 out of 5 LLE- HF 4+/5; KE 4+/5; DF/PF 5-/5  Musculoskeletal: No joint swelling or tenderness noted   Assessment/Plan: 1. Functional deficits which require 3+ hours per day of  interdisciplinary therapy in a comprehensive inpatient rehab setting. Physiatrist is providing close team supervision and 24 hour management of active medical problems listed below. Physiatrist and rehab team continue to assess barriers to discharge/monitor patient progress toward functional and medical goals  Care Tool:  Bathing    Body parts bathed by patient: Right arm, Left arm, Chest, Abdomen, Front perineal area, Buttocks, Right upper leg, Left lower leg, Face, Left upper leg, Right lower leg   Body parts bathed by helper: Left lower leg     Bathing assist Assist Level: Supervision/Verbal cueing     Upper Body Dressing/Undressing Upper body dressing   What is the patient wearing?: Pull over shirt    Upper body assist Assist Level: Supervision/Verbal cueing    Lower Body Dressing/Undressing Lower body dressing      What is the patient wearing?: Pants, Underwear/pull up     Lower body assist Assist for lower body dressing: Supervision/Verbal cueing     Toileting Toileting    Toileting assist Assist for toileting: Supervision/Verbal cueing     Transfers Chair/bed transfer  Transfers assist     Chair/bed transfer assist level: Supervision/Verbal cueing     Locomotion Ambulation   Ambulation assist      Assist level: Supervision/Verbal cueing Assistive device:  Rollator Max distance: 300+   Walk 10 feet activity   Assist     Assist level: Supervision/Verbal cueing Assistive device: Rollator   Walk 50 feet activity   Assist    Assist level: Supervision/Verbal cueing Assistive device: Rollator    Walk 150 feet activity   Assist Walk 150 feet activity did not occur: Safety/medical concerns  Assist level: Supervision/Verbal cueing Assistive device: Rollator    Walk 10 feet on uneven surface  activity   Assist     Assist level: Supervision/Verbal cueing Assistive device: Rollator   Wheelchair     Assist Is the patient  using a wheelchair?: No Type of Wheelchair: Manual    Wheelchair assist level: Total Assistance - Patient < 25%      Wheelchair 50 feet with 2 turns activity    Assist        Assist Level: Total Assistance - Patient < 25%   Wheelchair 150 feet activity     Assist      Assist Level: Total Assistance - Patient < 25%   Blood pressure 116/65, pulse 78, temperature 98.1 F (36.7 C), resp. rate 18, weight 70.2 kg, SpO2 96%.  Medical Problem List and Plan: 1. Functional deficits secondary to right thalamic capsular infarct and 2 punctate infarcts in the right occipital lobe with a possible small subacute infarct in the left frontal white matter with chronic microvascular ischemic changes             -patient may  shower             -ELOS/Goals: 10-14 days, supervision PT/OT/ST  -Continue CIR  -Day pass  -Patient and family considering independent living  -D/C today 09/20/23  2.  Antithrombotics: -DVT/anticoagulation:  Pharmaceutical: Lovenox             -antiplatelet therapy: Aspirin 81 mg daily and Plavix 75 mg daily x 3 months then aspirin alone 3. Pain Management: Tylenol as needed  -Cymbalta 30 mg for left-sided tingling/burning pain, may take weeks for full benefit 4. Mood/Behavior/Sleep: Trazodone 100 mg nightly, Xanax 1 mg twice daily as needed             -antipsychotic agents: N/A  -9/26 Add melatonin for insomnia, does not appear he took his home Xanax last night for anxiety..  Will ask nursing to make sure he knows he can get this if needed.  -9/29 reports poor sleep last night, did not use his p.m. Xanax  -10/1 will start Cymbalta for depression versus adjustment disorder with depressed mood  -10/2 schedule xanax at night, he reports taking this nightly, forgets to ask  -10/4 patient is sleeping better, mood appears to be increasing as well.  Continue current regimen 5. Neuropsych/cognition: This patient is capable of making decisions on his own behalf. 6.  Skin/Wound Care: Routine skin checks 7. Fluids/Electrolytes/Nutrition: Routine in and outs with follow-up chemistries 8.  BPH.  Proscar 5 mg daily/Flomax 0.4 mg daily  -10/4 remains continent, continue current regimen  -09/20/23 initially hadn't urinated this morning but able to urinate fine after standing up; discussed ways to help with urine stream 9.  Hyperlipidemia.  Lipitor 10.  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  Follow-up outpatient CT chest CTA 11. IV infiltration on L AC fossa- warm compresses  -improved 12.  Constipation.   Last BM 10/2, increase Senokot to 2 tabs at bedtime.  Continue MiraLAX twice daily.  Consider additional medication if no BM today -09/19/23 LBM  2-3 days ago, but pt states this is normal for him, since he's scheduled to leave tomorrow, asked nursing to give sorbitol today if no BM by 2-3pm.  -09/20/23 LBM yesterday, cont regimen as directed 13.  Malaise/fatigue.  COVID-19 test checked and negative  -9/27 reports continued fatigue but appears to be participating with therapy  -10/1 CBC/BMP overall stable 14. CKD?   -10/4 BUN/creatinine 25/1.27, appears around his new baseline, follow-up with PCP for continued monitoring.  Encourage fluids 15. Dysphagia   -10/3 MBS completed yesterday, he continues on this 3 thin via Provale cup, continue SLP   LOS: 12 days A FACE TO FACE EVALUATION WAS PERFORMED  [address] 09/20/2023, 9:52 AM

## 2023-09-21 ENCOUNTER — Other Ambulatory Visit (HOSPITAL_COMMUNITY): Payer: Self-pay

## 2023-09-22 ENCOUNTER — Telehealth: Payer: Self-pay

## 2023-09-22 NOTE — Telephone Encounter (Signed)
Transitional Care Call--who you spoke with Maceo Pro (Son) [phone].   Are you/is patient experiencing any problems since coming home? None.  Are there any questions regarding any aspect of care? No. He is stronger than before, but does not follow my advice. He tries to be independent as much as possible. But the Son stated he is there  for him, if needed. Are there any questions regarding medications administration/dosing? No. Are meds being taken as prescribed?  Yes. Patient should review meds with caller to confirm. Done. Have there been any falls? No Has Home Health been to the house and/or have they contacted you? NoFrances Furbish phone number given  [phone]. If not, have you tried to contact them? No. Can we help you contact them? [facility] phone number provided.   Are bowels and bladder emptying properly? Yes. Are there any unexpected incontinence issues? No. If applicable, is patient following bowel/bladder programs? N/A. Any fevers, problems with breathing, unexpected pain? No. Are there any skin problems or new areas of breakdown? No. Has the patient/family member arranged specialty MD follow up (ie cardiology/neurology/renal/surgical/etc)? Yes. Can we help arrange? No.  Does the patient need any other services or support that we can help arrange? No. Son advised to call back if needed. He will call Neuro for follow up appointment. And Cardiology about the heart monitor.  Are caregivers following through as expected in assisting the patient? Yes.        11. Has the patient quit smoking, drinking alcohol, or using drugs as recommended? Son advised. Appointment with:  Dr. Darcella Cheshire, 10/02/2023 at 1:00 PM, [address] [place] [address]

## 2023-09-28 ENCOUNTER — Telehealth: Payer: Self-pay | Admitting: Specialist

## 2023-09-28 NOTE — Telephone Encounter (Signed)
Paitent's son left message asking for a call back regarding visit scheduled on 10/02/23.  I called back and left a vm explaining the nature of the TOC visit. Left phone number should he have any further questions. Shirlean Mylar, MHA, OT/L [phone]

## 2023-10-02 ENCOUNTER — Encounter: Payer: Medicare Other | Attending: Physical Medicine & Rehabilitation | Admitting: Physical Medicine & Rehabilitation

## 2023-10-02 ENCOUNTER — Encounter: Payer: Self-pay | Admitting: Physical Medicine & Rehabilitation

## 2023-10-02 VITALS — BP 103/68 | HR 91 | Ht 68.0 in | Wt 164.0 lb

## 2023-10-02 DIAGNOSIS — I639 Cerebral infarction, unspecified: Secondary | ICD-10-CM | POA: Diagnosis present

## 2023-10-02 DIAGNOSIS — F39 Unspecified mood [affective] disorder: Secondary | ICD-10-CM | POA: Diagnosis present

## 2023-10-02 DIAGNOSIS — I7122 Aneurysm of the aortic arch, without rupture: Secondary | ICD-10-CM | POA: Insufficient documentation

## 2023-10-02 DIAGNOSIS — N400 Enlarged prostate without lower urinary tract symptoms: Secondary | ICD-10-CM | POA: Diagnosis present

## 2023-10-02 NOTE — Progress Notes (Signed)
Subjective:    Patient ID: James Current., male    DOB: Aug 30, 1934, 89 y.o.   MRN: [id number]  HPI Hospital discharge summary 09/18/2023  Brief HPI:   James Kulaga. is a 89 y.o. right-handed male with history significant for anxiety as well as BPH, TIA/lacunar infarct, polyneuropathy followed by Dr. Sherryll Burger at [facility] clinic neurology, mild cognitive impairment and tobacco use.  Per chart review patient lives with spouse.  Modified independent with rolling walker and straight point cane.  No driving.  Was actively participating with home therapies prior to admission.  He does have a home health aide comes in regularly to assist with ADLs.  Presented to [facility] 09/02/2019 for persistent left-sided weakness as well as a fall landing on his left arm.  No loss of consciousness.  CT/MRI showed acute right thalamocapsular infarction.  2 punctate acute infarcts in the right occipital lobe and possible small subacute infarct in the left frontal white matter.  CT angiogram head and neck showed occlusion versus critical stenosis of the right P2 PCA and poor/irregular distal opacification.  Severe distal left P2 PCA stenosis.  Moderate stenosis of the nondominant left intradural vertebral artery at its dural origin.  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  Admission chemistries unremarkable except BUN 24 creatinine 1.31.  Echocardiogram ejection fraction of 55 to 60% no wall motion abnormalities grade 1 diastolic dysfunction.  Neurology follow-up maintained on low-dose aspirin and Plavix x 3 months then aspirin alone.  Lovenox for DVT prophylaxis.  Therapy evaluations completed due to patient decreased functional mobility was admitted for a comprehensive rehab program.     Hospital Course: James Layman. was admitted to rehab 09/08/2023 for inpatient therapies to consist of PT, ST and OT at least three hours five days a week. Past admission physiatrist,  therapy team and rehab RN have worked together to provide customized collaborative inpatient rehab.  Pertaining to patient's right thalamic capsular infarction 2 punctate infarcts right occipital lobe possible small subacute infarct left frontal white matter.  Patient remain on low-dose aspirin and Plavix x 3 months then aspirin alone with neurology follow-up.  Patient did have a history of anxiety continue with Xanax as needed trazodone to help aid in sleep as well as the addition of Cymbalta.  History of BPH maintained on Proscar and Flomax voiding without difficulty.  Lipitor ongoing for hyperlipidemia.  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  Mild CKD latest creatinine 1.28 with encouragement of fluids and follow-up outpatient.     Interval history 10/02/2023 Patient reports he is doing overall well since his discharge from the hospital.  His son is here today for length of appointment here.  Patient has been staying with him to help.  Patient's wife is currently having medical issues with self and cannot provide much assistance at this time.  They are considering moving to a assisted living closer to their son.  He reports he was discharged from home OT and SLP.  He reports that SLP had him on a regular diet and he is tolerating this well without issues.  He continues to have home PT.  He is ambulating with his walker, denies falls.  Able to complete ADLs on his own.  Continues to have weakness and sensory changes on his left side.  He has not yet followed up with neurology.   Pain Inventory Average Pain 5  Pain Right Now 5 My pain is constant and aching  LOCATION OF PAIN  aching all over the body  BOWEL Number of stools per week: 7 Oral laxative use Yes   BLADDER Normal  Frequent urination Yes    Mobility use a walker how many minutes can you walk? 5 minutes ability to climb steps?  no do you drive?   no  Function retired  Neuro/Psych weakness numbness trouble walking  Prior Studies Any changes since last visit?  no  Physicians involved in your care Any changes since last visit?  no   Family History  Problem Relation Age of Onset   Prostate cancer Father    Social History   Socioeconomic History   Marital status: Married    Spouse name: Not on file   Number of children: Not on file   Years of education: Not on file   Highest education level: Not on file  Occupational History   Not on file  Tobacco Use   Smoking status: Former    Types: Cigarettes   Smokeless tobacco: Never  Vaping Use   Vaping status: Never Used  Substance and Sexual Activity   Alcohol use: Not Currently   Drug use: Never   Sexual activity: Yes    Birth control/protection: None  Other Topics Concern   Not on file  Social History Narrative   Not on file   Social Determinants of Health   Financial Resource Strain: Not on file  Food Insecurity: No Food Insecurity (09/03/2023)   Hunger Vital Sign    Worried About Running Out of Food in the Last Year: Never true    Ran Out of Food in the Last Year: Never true  Transportation Needs: No Transportation Needs (09/03/2023)   PRAPARE - Administrator, Civil Service (Medical): No    Lack of Transportation (Non-Medical): No  Physical Activity: Not on file  Stress: Not on file  Social Connections: Not on file   Past Surgical History:  Procedure Laterality Date   HAND SURGERY Left    HEMORRHOID SURGERY     TONSILLECTOMY AND ADENOIDECTOMY     Past Medical History:  Diagnosis Date   Anxiety    TIA (transient ischemic attack)    There were no vitals taken for this visit.  Opioid Risk Score:   Fall Risk Score:  `1  Depression screen PHQ 2/9      No data to display          Review of Systems  Musculoskeletal:        Body aches all over   Neurological:  Positive for weakness and numbness.  All other systems reviewed and  are negative.      Objective:   Physical Exam  Gen: no distress, normal appearing HEENT: oral mucosa pink and moist, NCAT Chest: normal effort, normal rate of breathing Abd: soft, non-distended Ext: no edema Psych: pleasant, normal affect Skin: intact Neuro alert and awake.  Follows commands, No CN deficits noted Sensation to light touch intact in all 4 extremities however altered in digits 4,5 on LUE and in b/l toes LUE- 5- out of 5 RLE- 5 out of 5 LLE- HF 4+/5; KE 5-/5; DF/PF 5-/5  Musculoskeletal: No joint swelling or tenderness noted Walks with walker- good step length and cadence       Assessment & Plan:   Medical Problem List and Plan: 1.Right thalamic capsular infarct and 2 punctate infarcts in the right occipital lobe with  a possible small subacute infarct in the left frontal white matter with chronic microvascular ischemic changes             -Patient and family considering independent living             -Advised f/u with neurology, Aspirin 81 mg daily and Plavix 75 mg daily x 3 months then aspirin alone  -Has f/u with cardiology  -Continue home PT  -Pt reports he was seen by SLP outpatient- pt reports he was upgraded to regular diet and tolerating this   2.  Mood disorder  -Continue cymbalta  3.  BPH.  Proscar 5 mg daily/Flomax 0.4 mg daily             -Reports bladder symptoms well controlled  4.  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   -Discussed prior recommendation outpatient CT chest CTA  5.  Constipation.   -Reports resolved  6. CKD             -F/U PCP

## 2023-10-06 NOTE — Addendum Note (Signed)
Encounter addended by: Bryna Colander, RN on: 10/06/2023 10:34 AM  Actions taken: Imaging Exam ended

## 2023-10-07 NOTE — Progress Notes (Unsigned)
Cardiology Office Note Date:  10/08/2023  Patient ID:  James Hofland., DOB [birth year], MRN [id number] PCP:  Marguarite Arbour, MD  Cardiologist:  None Electrophysiologist: Lanier Prude, MD     Chief Complaint: cryptogenic stroke  History of Present Illness: James [name]. is a 89 y.o. male with PMH notable for cryptogenic stroke, PAD, aortic arch aneurysm; seen today for Lanier Prude, MD for post hospital follow up.    Admitted 9/19-24/2024 with acute onset L-sided weakness, found to have R thalamic capsular infarct, punctate infarcts in the L frontal lobe.  Neurology consulted with concerns for cardioembolic source given strokes in multiple vascular territories.  He was discharged with zio monitor.   On follow-up today, he has been discharged from inpatient rehab and is now receiving outpatient rehab services once a week. He is recovering well from stroke, still has some L-sided weakness, but significantly improved. His main complaint today is ongoing fatigue.    he denies chest pain, palpitations, dyspnea, PND, orthopnea, nausea, vomiting, dizziness, syncope, edema, weight gain, or early satiety.      Past Medical History:  Diagnosis Date   Anxiety    TIA (transient ischemic attack)     Past Surgical History:  Procedure Laterality Date   HAND SURGERY Left    HEMORRHOID SURGERY     TONSILLECTOMY AND ADENOIDECTOMY      Current Outpatient Medications  Medication Instructions   acetaminophen (TYLENOL) 650 mg, Oral, Every 6 hours PRN   ALPRAZolam (XANAX) 1 mg, Oral, Daily at bedtime   aspirin EC 81 mg, Oral, Daily, Swallow whole.   atorvastatin (LIPITOR) 40 mg, Oral, Daily   clopidogrel (PLAVIX) 75 mg, Oral, Daily   DULoxetine (CYMBALTA) 30 mg, Oral, Daily   finasteride (PROSCAR) 5 mg, Oral, Daily   polyethylene glycol (MIRALAX / GLYCOLAX) 17 g, Oral, Daily   senna-docusate (SENOKOT-S) 8.6-50 MG tablet 2 tablets, Oral, Daily at bedtime    tamsulosin (FLOMAX) 0.4 mg, Oral, Daily   traZODone (DESYREL) 100 mg, Oral, Daily at bedtime    Social History:  The patient  reports that he has quit smoking. His smoking use included cigarettes. He has never used smokeless tobacco. He reports that he does not currently use alcohol. He reports that he does not use drugs.   Family History:   The patient's family history includes Prostate cancer in his father.  ROS:  Please see the history of present illness. All other systems are reviewed and otherwise negative.   PHYSICAL EXAM:  VS:  BP 108/69 (BP Location: Left Arm, Patient Position: Sitting, Cuff Size: Normal)   Pulse 82   Ht 5\' 8"  (1.727 m)   Wt 151 lb 6.4 oz (68.7 kg)   SpO2 97%   BMI 23.02 kg/m  BMI: Body mass index is 23.02 kg/m.  Orthostatic VS for the past 24 hrs (Last 3 readings):  BP- Lying Pulse- Lying BP- Sitting Pulse- Sitting BP- Standing at 0 minutes Pulse- Standing at 0 minutes BP- Standing at 3 minutes Pulse- Standing at 3 minutes  10/08/23 1128 109/72 84 111/62 88 90/64 81 107/64 90     GEN- The patient is well appearing, alert and oriented x 3 today.   Lungs- Clear to ausculation bilaterally, normal work of breathing.  Heart- Regular rate and rhythm, no murmurs, rubs or gallops Extremities- No peripheral edema, warm, dry   EKG is ordered. Personal review of EKG from today shows:    EKG Interpretation Date/Time:  Thursday October 08 2023 11:21:59 EDT Ventricular Rate:  82 PR Interval:  154 QRS Duration:  84 QT Interval:  372 QTC Calculation: 434 R Axis:   57  Text Interpretation: Sinus rhythm with Premature atrial complexes Confirmed by James [name] [id number]) on 10/08/2023 12:20:00 PM    Recent Labs: 05/09/2023: Magnesium 2.1; TSH 0.814 09/09/2023: ALT 29 09/15/2023: Hemoglobin 12.7; Platelets 181 09/18/2023: BUN 25; Creatinine, Ser 1.27; Potassium 3.7; Sodium 136  09/03/2023: Cholesterol 199; HDL 52; LDL Cholesterol 135; Total CHOL/HDL Ratio 3.8;  Triglycerides 61; VLDL 12   Estimated Creatinine Clearance: 39.6 mL/min (A) (by C-G formula based on SCr of 1.27 mg/dL (H)).   Wt Readings from Last 3 Encounters:  10/08/23 151 lb 6.4 oz (68.7 kg)  10/02/23 164 lb (74.4 kg)  09/16/23 154 lb 12.2 oz (70.2 kg)     Additional studies reviewed include: Previous EP, cardiology notes.   Long term monitor, 10/06/2023 (unofficial) Patient had a min HR of 40 bpm, max HR of 141 bpm, and avg HR of 78 bpm. Predominant underlying rhythm was Sinus Rhythm. Slight P wave morphology changes were noted. 1 run of Ventricular Tachycardia occurred lasting 7 beats with a max rate of 115 bpm (avg 102 bpm).  1895 Supraventricular Tachycardia runs occurred, the run with the fastest interval lasting 4 mins 46 secs with a max rate of 141 bpm (avg 101 bpm); the run with the fastest interval was also the longest. Some episodes of Supraventricular  Tachycardia may be possible Atrial Tachycardia with variable block. Isolated SVEs were occasional (2.4%, [id number]), SVE Couplets were rare (<1.0%, 2381), and SVE Triplets were rare (<1.0%, 953). Isolated VEs were rare (<1.0%, 881), VE Couplets were rare  (<1.0%, 30), and VE Triplets were rare (<1.0%, 1). Difficulty discerning atrial activity making definitive diagnosis difficult to ascertain.  TTE bubble study, 09/03/2023 1. Left ventricular ejection fraction, by estimation, is 55 to 60%. The left ventricle has normal function. The left ventricle has no regional wall motion abnormalities. Left ventricular diastolic parameters are consistent with Grade I diastolic dysfunction (impaired relaxation).   2. Right ventricular systolic function is normal. The right ventricular size is normal. There is normal pulmonary artery systolic pressure.   3. The mitral valve is normal in structure. No evidence of mitral valve regurgitation. No evidence of mitral stenosis.   4. The aortic valve has an indeterminant number of cusps. Aortic valve  regurgitation is not visualized. Aortic valve sclerosis is present, with no evidence of aortic valve stenosis.   5. The inferior vena cava is normal in size with greater than 50% respiratory variability, suggesting right atrial pressure of 3 mmHg.   6. Agitated saline contrast bubble study was negative, with no evidence of any interatrial shunt.     ASSESSMENT AND PLAN:  #) cryptogenic stroke #) SVT Patient recently admitted with cryptogenic stroke, is recovering well with ongoing therapy services Telemetry during hospitalization, EKGs, and outpatient ambulatory monitor did not show AFib Still concern for a cardioembolic source of stroke Long discussion with patient and daughter regarding indications for ILR implant, including pre-procedural and post-procedural expectations. Discussed monthly fee At this time, patient does not wish to proceed. He will discuss further with family, information provided.          Current medicines are reviewed at length with the patient today.   The patient does not have concerns regarding his medicines.  The following changes were made today:  none  Labs/ tests ordered today include:  Orders  Placed This Encounter  Procedures   EKG 12-Lead     Disposition: Follow up with Dr. Lalla Brothers or EP APP  PRN   Patient will notify office if he wishes to move forward with ILR implant. Will send to precert at that time.   Signed, James Don, NP  10/08/23  12:20 PM  Electrophysiology [facility]

## 2023-10-08 ENCOUNTER — Encounter: Payer: Self-pay | Admitting: Cardiology

## 2023-10-08 ENCOUNTER — Ambulatory Visit: Payer: Medicare Other | Attending: Cardiology | Admitting: Cardiology

## 2023-10-08 VITALS — BP 108/69 | HR 82 | Ht 68.0 in | Wt 151.4 lb

## 2023-10-08 DIAGNOSIS — I639 Cerebral infarction, unspecified: Secondary | ICD-10-CM | POA: Diagnosis not present

## 2023-10-08 NOTE — Patient Instructions (Signed)
Medication Instructions:  - Your physician recommends that you continue on your current medications as directed. Please refer to the Current Medication list given to you today.  *If you need a refill on your cardiac medications before your next appointment, please call your pharmacy*   Lab Work: - none ordered  If you have labs (blood work) drawn today and your tests are completely normal, you will receive your results only by: MyChart Message (if you have MyChart) OR A paper copy in the mail If you have any lab test that is abnormal or we need to change your treatment, we will call you to review the results.   Testing/Procedures: - Your physician has recommended that you have an Implantable Loop Recorder inserted.  Please call the office at [phone] if you decide you would like to proceed with this and we can arrange for you to come in and have this done in the office.    Follow-Up: At [facility], you and your health needs are our priority.  As part of our continuing mission to provide you with exceptional heart care, we have created designated Provider Care Teams.  These Care Teams include your primary Cardiologist (physician) and Advanced Practice Providers (APPs -  Physician Assistants and Nurse Practitioners) who all work together to provide you with the care you need, when you need it.  We recommend signing up for the patient portal called "MyChart".  Sign up information is provided on this After Visit Summary.  MyChart is used to connect with patients for Virtual Visits (Telemedicine).  Patients are able to view lab/test results, encounter notes, upcoming appointments, etc.  Non-urgent messages can be sent to your provider as well.   To learn more about what you can do with MyChart, go to [email or web address].    Your next appointment:   As needed  Provider:   Sherie Don, NP    Other Instructions Implantable Loop Recorder Placement  An implantable  loop recorder is a small electronic device that is placed under the skin of the chest. The device records the electrical activity of the heart over a long period of time. Your health care provider can download these recordings to monitor your heart. You may need an implantable loop recorder if you have periods of abnormal heart activity (arrhythmias) or unexplained fainting (syncope). The recorder can be left in place for as long as 3 years. Tell a health care provider about: Any allergies you have. All medicines you are taking, including vitamins, herbs, eye drops, creams, and over-the-counter medicines. Any problems you or family members have had with anesthesia. Any bleeding problems you have. Any surgeries you have had. Any medical conditions you have. Whether you are pregnant or may be pregnant. What are the risks? Your health care provider will talk with you about risks. These may include: Infection. Bleeding. Allergic reactions to anesthesia. Damage to nerves or blood vessels. Failure of the device to work. This could require another surgery to replace it. What happens before the procedure? You may have a physical exam, blood tests, and imaging tests, such as a chest X-ray. Follow instructions from your health care provider about what you may eat and drink Ask your health care provider about: Changing or stopping your regular medicines. These include any diabetes medicines or blood thinners you take. Taking medicines such as aspirin and ibuprofen. These medicines can thin your blood. Do not take them unless your health care provider tells you to. Taking over-the-counter medicines, vitamins,  herbs, and supplements. Ask your health care provider: How your surgery site will be marked. What steps will be taken to help prevent infection. These steps may include: Removing hair at the surgery site. Washing skin with a soap that kills germs. Taking antibiotics. If you will be going home  right after the procedure, plan to have a responsible adult: Take you home from the hospital or clinic. You will not be allowed to drive. Do not use any products that contain nicotine or tobacco before the procedure. These products include cigarettes, chewing tobacco, and vaping devices, such as e-cigarettes. If you need help quitting, ask your health care provider What happens during the procedure? An IV will be inserted into one of your veins. You may be given: A sedative. This helps you relax Anesthesia. This will: Numb certain areas of your body. Make you fall asleep for surgery. A small incision will be made on the left side of your upper chest. A pocket will be created under your skin. The device will be placed in the pocket. The incision will be closed with stitches (sutures) or adhesive strips. A bandage (dressing) will be placed over the incision. The procedure may vary among health care providers and hospitals. What happens after the procedure? Your blood pressure, heart rate, breathing rate, and blood oxygen level will be monitored until you leave the hospital or clinic. You may be able to go home on the day of your surgery. Before you go home: Your health care provider will program your recorder. You will learn how to trigger your device with a handheld activator. You will learn how to send recordings to your health care provider. You will get an ID card for your device, and you will be told when to use it. This information is not intended to replace advice given to you by your health care provider. Make sure you discuss any questions you have with your health care provider. Document Revised: 04/28/2022 Document Reviewed: 04/28/2022 Elsevier Patient Education  2024 [employer].

## 2023-10-09 NOTE — Progress Notes (Signed)
Patient was seen by you in clinic yesterday. He has declined ILR. Any other recommendations?

## 2023-10-12 NOTE — Progress Notes (Signed)
See EP recommendations. Please make sure that results have been sent to the patients PCP.

## 2024-01-19 ENCOUNTER — Ambulatory Visit: Payer: Medicare Other | Admitting: Neurology

## 2024-03-17 ENCOUNTER — Inpatient Hospital Stay (HOSPITAL_COMMUNITY)
Admission: EM | Admit: 2024-03-17 | Discharge: 2024-03-21 | DRG: 064 | Disposition: A | Discharge status: Rehabilitation Facility | Attending: Internal Medicine | Admitting: Internal Medicine

## 2024-03-17 ENCOUNTER — Encounter (HOSPITAL_COMMUNITY): Payer: Self-pay | Admitting: Internal Medicine

## 2024-03-17 ENCOUNTER — Emergency Department (HOSPITAL_COMMUNITY)

## 2024-03-17 ENCOUNTER — Other Ambulatory Visit: Payer: Self-pay

## 2024-03-17 ENCOUNTER — Observation Stay (HOSPITAL_COMMUNITY)

## 2024-03-17 DIAGNOSIS — Z66 Do not resuscitate: Secondary | ICD-10-CM | POA: Diagnosis present

## 2024-03-17 DIAGNOSIS — Z7902 Long term (current) use of antithrombotics/antiplatelets: Secondary | ICD-10-CM

## 2024-03-17 DIAGNOSIS — I639 Cerebral infarction, unspecified: Principal | ICD-10-CM | POA: Diagnosis present

## 2024-03-17 DIAGNOSIS — G4700 Insomnia, unspecified: Secondary | ICD-10-CM | POA: Diagnosis present

## 2024-03-17 DIAGNOSIS — Z8042 Family history of malignant neoplasm of prostate: Secondary | ICD-10-CM

## 2024-03-17 DIAGNOSIS — N400 Enlarged prostate without lower urinary tract symptoms: Secondary | ICD-10-CM | POA: Diagnosis not present

## 2024-03-17 DIAGNOSIS — E782 Mixed hyperlipidemia: Secondary | ICD-10-CM | POA: Diagnosis present

## 2024-03-17 DIAGNOSIS — Z87891 Personal history of nicotine dependence: Secondary | ICD-10-CM

## 2024-03-17 DIAGNOSIS — Z7982 Long term (current) use of aspirin: Secondary | ICD-10-CM

## 2024-03-17 DIAGNOSIS — I739 Peripheral vascular disease, unspecified: Secondary | ICD-10-CM | POA: Diagnosis present

## 2024-03-17 DIAGNOSIS — F411 Generalized anxiety disorder: Secondary | ICD-10-CM | POA: Diagnosis present

## 2024-03-17 DIAGNOSIS — Z1152 Encounter for screening for COVID-19: Secondary | ICD-10-CM

## 2024-03-17 DIAGNOSIS — Z79899 Other long term (current) drug therapy: Secondary | ICD-10-CM

## 2024-03-17 DIAGNOSIS — T426X5A Adverse effect of other antiepileptic and sedative-hypnotic drugs, initial encounter: Secondary | ICD-10-CM | POA: Diagnosis present

## 2024-03-17 DIAGNOSIS — I6349 Cerebral infarction due to embolism of other cerebral artery: Secondary | ICD-10-CM | POA: Diagnosis not present

## 2024-03-17 DIAGNOSIS — G629 Polyneuropathy, unspecified: Secondary | ICD-10-CM | POA: Diagnosis present

## 2024-03-17 DIAGNOSIS — G8191 Hemiplegia, unspecified affecting right dominant side: Secondary | ICD-10-CM | POA: Diagnosis present

## 2024-03-17 DIAGNOSIS — R29716 NIHSS score 16: Secondary | ICD-10-CM | POA: Diagnosis present

## 2024-03-17 DIAGNOSIS — T424X5A Adverse effect of benzodiazepines, initial encounter: Secondary | ICD-10-CM | POA: Diagnosis present

## 2024-03-17 DIAGNOSIS — G928 Other toxic encephalopathy: Secondary | ICD-10-CM | POA: Diagnosis present

## 2024-03-17 LAB — CBC WITH DIFFERENTIAL/PLATELET
Abs Immature Granulocytes: 0.01 10*3/uL (ref 0.00–0.07)
Basophils Absolute: 0.1 10*3/uL (ref 0.0–0.1)
Basophils Relative: 1 %
Eosinophils Absolute: 0.3 10*3/uL (ref 0.0–0.5)
Eosinophils Relative: 4 %
HCT: 40.7 % (ref 39.0–52.0)
Hemoglobin: 12.8 g/dL — ABNORMAL LOW (ref 13.0–17.0)
Immature Granulocytes: 0 %
Lymphocytes Relative: 19 %
Lymphs Abs: 1.1 10*3/uL (ref 0.7–4.0)
MCH: 29.6 pg (ref 26.0–34.0)
MCHC: 31.4 g/dL (ref 30.0–36.0)
MCV: 94.2 fL (ref 80.0–100.0)
Monocytes Absolute: 0.6 10*3/uL (ref 0.1–1.0)
Monocytes Relative: 9 %
Neutro Abs: 3.8 10*3/uL (ref 1.7–7.7)
Neutrophils Relative %: 67 %
Platelets: 164 10*3/uL (ref 150–400)
RBC: 4.32 MIL/uL (ref 4.22–5.81)
RDW: 13.8 % (ref 11.5–15.5)
WBC: 5.9 10*3/uL (ref 4.0–10.5)
nRBC: 0 % (ref 0.0–0.2)

## 2024-03-17 LAB — URINALYSIS, W/ REFLEX TO CULTURE (INFECTION SUSPECTED)
Bacteria, UA: NONE SEEN
Bilirubin Urine: NEGATIVE
Glucose, UA: NEGATIVE mg/dL
Ketones, ur: NEGATIVE mg/dL
Leukocytes,Ua: NEGATIVE
Nitrite: NEGATIVE
Protein, ur: NEGATIVE mg/dL
Specific Gravity, Urine: 1.02 (ref 1.005–1.030)
pH: 5 (ref 5.0–8.0)

## 2024-03-17 LAB — RESP PANEL BY RT-PCR (RSV, FLU A&B, COVID)  RVPGX2
Influenza A by PCR: NEGATIVE
Influenza B by PCR: NEGATIVE
Resp Syncytial Virus by PCR: NEGATIVE
SARS Coronavirus 2 by RT PCR: NEGATIVE

## 2024-03-17 LAB — COMPREHENSIVE METABOLIC PANEL WITH GFR
ALT: 17 U/L (ref 0–44)
AST: 21 U/L (ref 15–41)
Albumin: 3.6 g/dL (ref 3.5–5.0)
Alkaline Phosphatase: 54 U/L (ref 38–126)
Anion gap: 6 (ref 5–15)
BUN: 26 mg/dL — ABNORMAL HIGH (ref 8–23)
CO2: 26 mmol/L (ref 22–32)
Calcium: 9 mg/dL (ref 8.9–10.3)
Chloride: 106 mmol/L (ref 98–111)
Creatinine, Ser: 1.19 mg/dL (ref 0.61–1.24)
GFR, Estimated: 59 mL/min — ABNORMAL LOW (ref >60)
Glucose, Bld: 123 mg/dL — ABNORMAL HIGH (ref 70–99)
Potassium: 3.9 mmol/L (ref 3.5–5.1)
Sodium: 138 mmol/L (ref 135–145)
Total Bilirubin: 0.6 mg/dL (ref 0.0–1.2)
Total Protein: 7.4 g/dL (ref 6.5–8.1)

## 2024-03-17 LAB — BRAIN NATRIURETIC PEPTIDE: B Natriuretic Peptide: 66.6 pg/mL (ref 0.0–100.0)

## 2024-03-17 LAB — TROPONIN I (HIGH SENSITIVITY)
Troponin I (High Sensitivity): 4 ng/L (ref <18)
Troponin I (High Sensitivity): 4 ng/L (ref <18)

## 2024-03-17 LAB — CBG MONITORING, ED: Glucose-Capillary: 154 mg/dL — ABNORMAL HIGH (ref 70–99)

## 2024-03-17 MED ORDER — FINASTERIDE 5 MG PO TABS
5.0000 mg | ORAL_TABLET | Freq: Every day | ORAL | Status: DC
  Administered 2024-03-18 – 2024-03-21 (×4): 5 mg via ORAL
  Filled 2024-03-17 (×4): qty 1

## 2024-03-17 MED ORDER — ATORVASTATIN CALCIUM 40 MG PO TABS
40.0000 mg | ORAL_TABLET | Freq: Every day | ORAL | Status: DC
  Administered 2024-03-18 – 2024-03-21 (×4): 40 mg via ORAL
  Filled 2024-03-17 (×4): qty 1

## 2024-03-17 MED ORDER — INFLUENZA VAC A&B SURF ANT ADJ 0.5 ML IM SUSY
0.5000 mL | PREFILLED_SYRINGE | INTRAMUSCULAR | Status: DC
  Filled 2024-03-17: qty 0.5

## 2024-03-17 MED ORDER — ENOXAPARIN SODIUM 40 MG/0.4ML IJ SOSY
40.0000 mg | PREFILLED_SYRINGE | INTRAMUSCULAR | Status: DC
  Administered 2024-03-17 – 2024-03-20 (×4): 40 mg via SUBCUTANEOUS
  Filled 2024-03-17 (×4): qty 0.4

## 2024-03-17 MED ORDER — ONDANSETRON HCL 4 MG PO TABS
4.0000 mg | ORAL_TABLET | Freq: Four times a day (QID) | ORAL | Status: DC | PRN
Start: 2024-03-17 — End: 2024-03-21

## 2024-03-17 MED ORDER — CLOPIDOGREL BISULFATE 75 MG PO TABS
75.0000 mg | ORAL_TABLET | Freq: Every day | ORAL | Status: DC
  Administered 2024-03-18 – 2024-03-21 (×4): 75 mg via ORAL
  Filled 2024-03-17 (×4): qty 1

## 2024-03-17 MED ORDER — ONDANSETRON HCL 4 MG/2ML IJ SOLN: 4.0000 mg | Freq: Four times a day (QID) | INTRAMUSCULAR | Status: DC | PRN

## 2024-03-17 MED ORDER — ALPRAZOLAM 0.5 MG PO TABS
1.0000 mg | ORAL_TABLET | Freq: Every day | ORAL | Status: DC
  Administered 2024-03-18: 1 mg via ORAL
  Filled 2024-03-17 (×2): qty 2

## 2024-03-17 MED ORDER — TAMSULOSIN HCL 0.4 MG PO CAPS
0.4000 mg | ORAL_CAPSULE | Freq: Every day | ORAL | Status: DC
  Administered 2024-03-18 – 2024-03-21 (×4): 0.4 mg via ORAL
  Filled 2024-03-17 (×4): qty 1

## 2024-03-17 MED ORDER — ACETAMINOPHEN 650 MG RE SUPP: 650.0000 mg | Freq: Four times a day (QID) | RECTAL | Status: DC | PRN

## 2024-03-17 MED ORDER — HYDRALAZINE HCL 20 MG/ML IJ SOLN: 10.0000 mg | Freq: Four times a day (QID) | INTRAMUSCULAR | Status: DC | PRN

## 2024-03-17 MED ORDER — POLYETHYLENE GLYCOL 3350 17 G PO PACK: 17.0000 g | PACK | Freq: Every day | ORAL | Status: DC | PRN

## 2024-03-17 MED ORDER — IOHEXOL 350 MG/ML SOLN
75.0000 mL | Freq: Once | INTRAVENOUS | Status: AC | PRN
  Administered 2024-03-17: 75 mL via INTRAVENOUS

## 2024-03-17 MED ORDER — PNEUMOCOCCAL 20-VAL CONJ VACC 0.5 ML IM SUSY
0.5000 mL | PREFILLED_SYRINGE | INTRAMUSCULAR | Status: DC
  Filled 2024-03-17: qty 0.5

## 2024-03-17 MED ORDER — STROKE: EARLY STAGES OF RECOVERY BOOK
Freq: Once | Status: AC
  Filled 2024-03-17: qty 1

## 2024-03-17 MED ORDER — ACETAMINOPHEN 325 MG PO TABS: 650.0000 mg | ORAL_TABLET | Freq: Four times a day (QID) | ORAL | Status: DC | PRN

## 2024-03-17 NOTE — Plan of Care (Signed)
   Problem: Education: Goal: Knowledge of General Education information will improve Description Including pain rating scale, medication(s)/side effects and non-pharmacologic comfort measures Outcome: Progressing   Problem: Health Behavior/Discharge Planning: Goal: Ability to manage health-related needs will improve Outcome: Progressing

## 2024-03-17 NOTE — Hospital Course (Addendum)
 89yo with h/o anxiety d/o, PAD with descending aortic aneurysm, BPH, and prior CVA in 08/2023 who presented on 4/3 with R-sided weakness and was found to have acute CVA with 2 infarcts.  Neurology consulted.  CTA and echo unremarkable.  Concern for afib but this is not confirmed; will need Zio patch monitoring at time of dc as well as cardiology and neurology outpatient f/u.  He is accepted to [facility] with insurance pending.

## 2024-03-17 NOTE — Assessment & Plan Note (Signed)
 Continue atorvastatin 40 mg daily for now Lipid panel in the morning, consider adjusting if above target of 70.

## 2024-03-17 NOTE — Assessment & Plan Note (Signed)
 Continue home regimen of Proscar and Flomax No evidence of lower urinary tract symptoms or obstruction at this time

## 2024-03-17 NOTE — Assessment & Plan Note (Signed)
 Continue home regimen of Xanax nightly

## 2024-03-17 NOTE — ED Provider Notes (Signed)
 Signed out to check mri.   Mri c/w recent acute cva. Symptom onset > 24 hours ago.   Hospitalists consulted for admission.    Cathren Laine, MD 03/17/24 [phone]

## 2024-03-17 NOTE — H&P (Signed)
 History and Physical    Patient: James [name]. 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  Of note, after the patient's hospitalization and course in acute rehab 08/2023 after his last stroke patient was discharged home on a Zio patch due to concerns for a cryptogenic stroke.  This Zio patch did not reveal any evidence of arrhythmia.  Upon patient follow-up with cardiology 10/04/2023, they offered an implantable loop recorder for further workup considering the continued suspicion that this was a cardioembolic cryptogenic stroke.  Patient declined the procedure at that time.  Patient explains that at approximately 11 AM yesterday morning while the patient was resting in his home he suddenly began to experience tingling and numbness of the right upper and right lower extremity.  At approximately the same time patient also noticed that he was experiencing some weakness of the right arm and right leg as well.  Patient denies any associated visual changes, slurring of his speech, changes in his gait, headaches or confusion along with the symptoms.  Patient explains that his symptoms continued to persist throughout the day and overnight.  The patient reports that he did not seek medical attention the first day of symptoms due to the fact that he thought that they would quickly dissipate on their own.  By the following morning the patient's symptoms continued to  persist which prompted the patient to present to [facility] emergency department for evaluation.  Upon evaluation in the emergency department an MRI of the brain was performed without contrast revealing 2 subcentimeter acute infarcts 1 within the anterior right frontal lobe periventricular white matter and the other within the left periatrial white matter.  Due to concerns for recurrent stroke the hospitalist group was then called to assess the patient for admission to the hospital.   Review of Systems: Review of Systems  Neurological:  Positive for sensory change and focal weakness.  All other systems reviewed and are negative.    Past Medical History:  Diagnosis Date   Anxiety    TIA (transient ischemic attack)     Past Surgical History:  Procedure Laterality Date   HAND SURGERY Left    HEMORRHOID SURGERY     TONSILLECTOMY AND ADENOIDECTOMY      Social History:  reports that he has quit smoking. His smoking use included cigarettes. He has never used smokeless tobacco. He reports that he does not currently use alcohol. He reports that he does not use drugs.  No Known Allergies  Family History  Problem Relation Age of Onset   Prostate cancer Father     Prior to Admission medications   Medication Sig Start Date End Date Taking? Authorizing Provider  acetaminophen (TYLENOL) 325 MG tablet Take 2 tablets (650 mg total) by mouth every 6 (six) hours as needed for mild pain (or Fever >/= 101). 09/17/23   Angiulli, Mcarthur Rossetti, PA-C  ALPRAZolam Prudy Feeler) 1 MG tablet Take 1 tablet (1 mg total) by mouth at bedtime. 09/18/23  AngiulliMcarthur Rossetti, PA-C  aspirin EC 81 MG tablet Take 1 tablet (81 mg total) by mouth daily. Swallow whole. 09/08/23   Arnetha Courser, MD  atorvastatin (LIPITOR) 40 MG tablet Take 1 tablet (40 mg total) by mouth daily. 09/18/23   Angiulli, Mcarthur Rossetti, PA-C  clopidogrel (PLAVIX) 75 MG tablet Take 1 tablet (75 mg total) by mouth daily. 09/18/23   Angiulli, Mcarthur Rossetti,  PA-C  DULoxetine (CYMBALTA) 30 MG capsule Take 1 capsule (30 mg total) by mouth daily. 09/18/23   Angiulli, Mcarthur Rossetti, PA-C  finasteride (PROSCAR) 5 MG tablet Take 1 tablet (5 mg total) by mouth daily. 09/18/23   Angiulli, Mcarthur Rossetti, PA-C  polyethylene glycol (MIRALAX / GLYCOLAX) 17 g packet Take 17 g by mouth daily. 09/08/23   Arnetha Courser, MD  senna-docusate (SENOKOT-S) 8.6-50 MG tablet Take 2 tablets by mouth at bedtime. 09/20/23   Ranelle Oyster, MD  tamsulosin (FLOMAX) 0.4 MG CAPS capsule Take 1 capsule (0.4 mg total) by mouth daily. 09/18/23   Angiulli, Mcarthur Rossetti, PA-C  traZODone (DESYREL) 100 MG tablet Take 1 tablet (100 mg total) by mouth at bedtime. 09/18/23   Charlton Amor, PA-C    Physical Exam:  Vitals:   03/17/24 1022 03/17/24 1315 03/17/24 1413 03/17/24 1615  BP: 113/76 128/85  (!) 145/66  Pulse: 96 84  (!) 51  Resp: 18 15  16   Temp: 98.1 F (36.7 C)  97.6 F (36.4 C)   TempSrc: Oral  Oral   SpO2: 97% 97%  97%    Constitutional: Awake alert and oriented x3, no associated distress.   Skin: no rashes, no lesions, poor skin turgor noted. Eyes: Pupils are equally reactive to light.  No evidence of scleral icterus or conjunctival pallor.  ENMT: Moist mucous membranes noted.  Posterior pharynx clear of any exudate or lesions.   Neck: normal, supple, no masses, no thyromegaly.  No evidence of jugular venous distension.   Respiratory: clear to auscultation bilaterally, no wheezing, no crackles. Normal respiratory effort. No accessory muscle use.  Cardiovascular: Regular rate and rhythm, no murmurs / rubs / gallops. No extremity edema. 2+ pedal pulses. No carotid bruits.  Chest:   Nontender without crepitus or deformity.   Back:   Nontender without crepitus or deformity. Abdomen: Abdomen is soft and nontender.  No evidence of intra-abdominal masses.  Positive bowel sounds noted in all quadrants.   Musculoskeletal: No joint deformity upper and lower extremities. Good ROM, no  contractures. Normal muscle tone.  Neurologic: Mild right upper extremity and right lower extremity weakness noted on strength exam although difference was minimal.  Weakness seems to be equal in both the distal and proximal muscle groups.  CN 2-12 grossly intact. Sensation intact.  Patient is following all commands.  Patient is responsive to verbal stimuli.   Psychiatric: Patient exhibits sad mood and appropriate affect.  Patient seems to possess insight as to their current situation.    Data Reviewed:  I have personally reviewed and interpreted labs, imaging.  Significant findings are   Lab Results  Component Value Date   WBC 5.9 03/17/2024   HGB 12.8 (L) 03/17/2024   HCT 40.7 03/17/2024   MCV 94.2 03/17/2024   PLT 164 03/17/2024   Lab Results  Component Value Date   K 3.9 03/17/2024   Lab Results  Component Value Date   BUN 26 (H) 03/17/2024   Lab Results  Component Value Date   CREATININE 1.19 03/17/2024    CXR:  Chest X-ray was personally reviewed.  No evidence of focal infiltrates.  No evidence of pleural effusion.  No evidence of pneumothorax.    EKG: Personally reviewed.  Rhythm is normal sinus rhythm with heart rate of 95 bpm.  No dynamic ST segment changes appreciated.  Assessment & Plan Acute stroke due to ischemia [facility]) Case discussed with Dr. Armanda Magic with Neurology.  Considering patient is over 24 hours since onset of his symptoms, tPA will not be pursued.  He recommends admission to [facility], pursuing a stroke workup including CT angiogram of the head and neck, echocardiogram and overnight telemetry.  He recommends discontinuation of patient's home aspirin with continuation of Plavix monotherapy, consideration of Zio patch at time of discharge with outpatient neurology follow-up and does not believe the patient needs to be transferred to [facility] at this time.   Performing serial neurologic checks Monitoring patient on telemetry Continue  Plavix as mentioned above Daily statin therapy with a statin 40 mg daily, will adjust if LDL is greater than 70 Further imaging to include: CT angiogram of the head and neck Obtaining hemoglobin A1c and lipid panel in the morning Echocardiogram in the morning  PT, OT, SLP evaluation Permissive hypertension with as needed antihypertensives only to be given if blood pressure greater than 220/115  BPH without urinary obstruction or lower urinary tract symptoms Continue home regimen of Proscar and Flomax No evidence of lower urinary tract symptoms or obstruction at this time Generalized anxiety disorder Continue home regimen of Xanax nightly Mixed hyperlipidemia Continue atorvastatin 40 mg daily for now Lipid panel in the morning, consider adjusting if above target of 70.    Code Status:  DNR  code status decision has been confirmed with: patient   Consults: Case discussed with Dr. Armanda Magic with neurology over the phone who will not formally consult.  Severity of Illness:  The appropriate patient status for this patient is OBSERVATION. Observation status is judged to be reasonable and necessary in order to provide the required intensity of service to ensure the patient's safety. The patient's presenting symptoms, physical exam findings, and initial radiographic and laboratory data in the context of their medical condition is felt to place them at decreased risk for further clinical deterioration. Furthermore, it is anticipated that the patient will be medically stable for discharge from the hospital within 2 midnights of admission.   Author:  Marinda Elk MD  03/17/2024 5:22 PM

## 2024-03-17 NOTE — ED Triage Notes (Signed)
 Pt states that he recently had a stroke with L sided numbness as his main symptom. Yesterday before breakfast he began having the same symptoms, but on his R side. Pt endorses paresthesias to hands and feet, but no unilateral weakness or sensation changes appreciated during triage. Pt on Plavix daily. Pt also c/o SOB that started yesterday.

## 2024-03-17 NOTE — Assessment & Plan Note (Signed)
 Case discussed with Dr. Armanda Magic with Neurology.  Considering patient is over 24 hours since onset of his symptoms, tPA will not be pursued.  He recommends admission to [facility], pursuing a stroke workup including CT angiogram of the head and neck, echocardiogram and overnight telemetry.  He recommends discontinuation of patient's home aspirin with continuation of Plavix monotherapy, consideration of Zio patch at time of discharge with outpatient neurology follow-up and does not believe the patient needs to be transferred to [facility] at this time.   Performing serial neurologic checks Monitoring patient on telemetry Continue Plavix as mentioned above Daily statin therapy with a statin 40 mg daily, will adjust if LDL is greater than 70 Further imaging to include: CT angiogram of the head and neck Obtaining hemoglobin A1c and lipid panel in the morning Echocardiogram in the morning  PT, OT, SLP evaluation Permissive hypertension with as needed antihypertensives only to be given if blood pressure greater than 220/115

## 2024-03-18 ENCOUNTER — Observation Stay (HOSPITAL_COMMUNITY)

## 2024-03-18 DIAGNOSIS — I6349 Cerebral infarction due to embolism of other cerebral artery: Secondary | ICD-10-CM | POA: Diagnosis present

## 2024-03-18 DIAGNOSIS — I10 Essential (primary) hypertension: Secondary | ICD-10-CM | POA: Diagnosis not present

## 2024-03-18 DIAGNOSIS — G8191 Hemiplegia, unspecified affecting right dominant side: Secondary | ICD-10-CM | POA: Diagnosis present

## 2024-03-18 DIAGNOSIS — I739 Peripheral vascular disease, unspecified: Secondary | ICD-10-CM | POA: Diagnosis present

## 2024-03-18 DIAGNOSIS — G629 Polyneuropathy, unspecified: Secondary | ICD-10-CM

## 2024-03-18 DIAGNOSIS — T426X5A Adverse effect of other antiepileptic and sedative-hypnotic drugs, initial encounter: Secondary | ICD-10-CM | POA: Diagnosis present

## 2024-03-18 DIAGNOSIS — K5901 Slow transit constipation: Secondary | ICD-10-CM | POA: Diagnosis not present

## 2024-03-18 DIAGNOSIS — G928 Other toxic encephalopathy: Secondary | ICD-10-CM | POA: Diagnosis present

## 2024-03-18 DIAGNOSIS — I959 Hypotension, unspecified: Secondary | ICD-10-CM | POA: Diagnosis not present

## 2024-03-18 DIAGNOSIS — Z79899 Other long term (current) drug therapy: Secondary | ICD-10-CM | POA: Diagnosis not present

## 2024-03-18 DIAGNOSIS — Z7982 Long term (current) use of aspirin: Secondary | ICD-10-CM | POA: Diagnosis not present

## 2024-03-18 DIAGNOSIS — Z7902 Long term (current) use of antithrombotics/antiplatelets: Secondary | ICD-10-CM | POA: Diagnosis not present

## 2024-03-18 DIAGNOSIS — Z66 Do not resuscitate: Secondary | ICD-10-CM | POA: Diagnosis present

## 2024-03-18 DIAGNOSIS — I639 Cerebral infarction, unspecified: Secondary | ICD-10-CM | POA: Diagnosis present

## 2024-03-18 DIAGNOSIS — Z8042 Family history of malignant neoplasm of prostate: Secondary | ICD-10-CM | POA: Diagnosis not present

## 2024-03-18 DIAGNOSIS — G4700 Insomnia, unspecified: Secondary | ICD-10-CM | POA: Diagnosis present

## 2024-03-18 DIAGNOSIS — R29716 NIHSS score 16: Secondary | ICD-10-CM | POA: Diagnosis present

## 2024-03-18 DIAGNOSIS — Z1152 Encounter for screening for COVID-19: Secondary | ICD-10-CM | POA: Diagnosis not present

## 2024-03-18 DIAGNOSIS — Z87891 Personal history of nicotine dependence: Secondary | ICD-10-CM | POA: Diagnosis not present

## 2024-03-18 DIAGNOSIS — I69354 Hemiplegia and hemiparesis following cerebral infarction affecting left non-dominant side: Secondary | ICD-10-CM | POA: Diagnosis not present

## 2024-03-18 DIAGNOSIS — N400 Enlarged prostate without lower urinary tract symptoms: Secondary | ICD-10-CM | POA: Diagnosis present

## 2024-03-18 DIAGNOSIS — Z87898 Personal history of other specified conditions: Secondary | ICD-10-CM | POA: Diagnosis not present

## 2024-03-18 DIAGNOSIS — F411 Generalized anxiety disorder: Secondary | ICD-10-CM | POA: Diagnosis present

## 2024-03-18 DIAGNOSIS — E782 Mixed hyperlipidemia: Secondary | ICD-10-CM | POA: Diagnosis present

## 2024-03-18 DIAGNOSIS — T424X5A Adverse effect of benzodiazepines, initial encounter: Secondary | ICD-10-CM | POA: Diagnosis present

## 2024-03-18 LAB — CBC WITH DIFFERENTIAL/PLATELET
Abs Immature Granulocytes: 0.01 10*3/uL (ref 0.00–0.07)
Basophils Absolute: 0.1 10*3/uL (ref 0.0–0.1)
Basophils Relative: 1 %
Eosinophils Absolute: 0.4 10*3/uL (ref 0.0–0.5)
Eosinophils Relative: 5 %
HCT: 39.6 % (ref 39.0–52.0)
Hemoglobin: 12.5 g/dL — ABNORMAL LOW (ref 13.0–17.0)
Immature Granulocytes: 0 %
Lymphocytes Relative: 23 %
Lymphs Abs: 1.5 10*3/uL (ref 0.7–4.0)
MCH: 29.6 pg (ref 26.0–34.0)
MCHC: 31.6 g/dL (ref 30.0–36.0)
MCV: 93.6 fL (ref 80.0–100.0)
Monocytes Absolute: 0.6 10*3/uL (ref 0.1–1.0)
Monocytes Relative: 9 %
Neutro Abs: 4 10*3/uL (ref 1.7–7.7)
Neutrophils Relative %: 62 %
Platelets: 145 10*3/uL — ABNORMAL LOW (ref 150–400)
RBC: 4.23 MIL/uL (ref 4.22–5.81)
RDW: 13.6 % (ref 11.5–15.5)
WBC: 6.5 10*3/uL (ref 4.0–10.5)
nRBC: 0 % (ref 0.0–0.2)

## 2024-03-18 LAB — ECHOCARDIOGRAM COMPLETE
AR max vel: 2.39 cm2
AV Area VTI: 2.56 cm2
AV Area mean vel: 2.51 cm2
AV Mean grad: 2 mmHg
AV Peak grad: 3.4 mmHg
Ao pk vel: 0.93 m/s
Area-P 1/2: 4.23 cm2
Calc EF: 70.4 %
Height: 68 in
Single Plane A2C EF: 66.7 %
Single Plane A4C EF: 72 %
Weight: 2592.61 [oz_av]

## 2024-03-18 LAB — COMPREHENSIVE METABOLIC PANEL WITH GFR
ALT: 17 U/L (ref 0–44)
AST: 20 U/L (ref 15–41)
Albumin: 3.5 g/dL (ref 3.5–5.0)
Alkaline Phosphatase: 49 U/L (ref 38–126)
Anion gap: 7 (ref 5–15)
BUN: 22 mg/dL (ref 8–23)
CO2: 24 mmol/L (ref 22–32)
Calcium: 8.9 mg/dL (ref 8.9–10.3)
Chloride: 106 mmol/L (ref 98–111)
Creatinine, Ser: 1 mg/dL (ref 0.61–1.24)
GFR, Estimated: >60 mL/min (ref >60)
Glucose, Bld: 90 mg/dL (ref 70–99)
Potassium: 3.7 mmol/L (ref 3.5–5.1)
Sodium: 137 mmol/L (ref 135–145)
Total Bilirubin: 0.8 mg/dL (ref 0.0–1.2)
Total Protein: 7 g/dL (ref 6.5–8.1)

## 2024-03-18 LAB — LIPID PANEL
Cholesterol: 109 mg/dL (ref 0–200)
HDL: 52 mg/dL (ref >40)
LDL Cholesterol: 44 mg/dL (ref 0–99)
Total CHOL/HDL Ratio: 2.1 ratio
Triglycerides: 64 mg/dL (ref <150)
VLDL: 13 mg/dL (ref 0–40)

## 2024-03-18 LAB — MAGNESIUM: Magnesium: 2.1 mg/dL (ref 1.7–2.4)

## 2024-03-18 MED ORDER — PREGABALIN 50 MG PO CAPS
50.0000 mg | ORAL_CAPSULE | Freq: Two times a day (BID) | ORAL | Status: DC
  Administered 2024-03-18: 50 mg via ORAL
  Filled 2024-03-18: qty 1

## 2024-03-18 MED ORDER — PERFLUTREN LIPID MICROSPHERE
1.0000 mL | INTRAVENOUS | Status: AC | PRN
  Administered 2024-03-18: 2 mL via INTRAVENOUS

## 2024-03-18 MED ORDER — DOCUSATE SODIUM 100 MG PO CAPS
100.0000 mg | ORAL_CAPSULE | Freq: Every day | ORAL | Status: DC
  Administered 2024-03-19 – 2024-03-21 (×3): 100 mg via ORAL
  Filled 2024-03-18 (×3): qty 1

## 2024-03-18 NOTE — Assessment & Plan Note (Addendum)
 Echocardiogram does not reveal a cardioembolic source  Review of telemetry overnight reveals no evidence of atrial fibrillation  CT angiogram of the head neck reveals no evidence of large vessel occlusion.   Continue to perform serial neurologic checks Monitoring patient on telemetry Continue Plavix as mentioned above LDL is less than 70, will continue statin therapy with atorvastatin 40 mg daily Speech therapy evaluation complete, recommending dysphagia 3 diet with thin liquids for degree of dysphagia PT evaluation complete, recommending acute inpatient rehab - sending request for consideration of admission to CIR at [facility] ending permissive hypertension

## 2024-03-18 NOTE — Progress Notes (Signed)
 PROGRESS NOTE   [name]  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 emergency department with complaints of new onset weakness of the right side.  Patient explains that at approximately 11 AM yesterday morning while the patient was resting in his home he suddenly began to experience tingling and numbness of the right upper and right lower extremity.  At approximately the same time patient also noticed that he was experiencing some weakness of the right arm and right leg as well.  Patient denies any associated visual changes, slurring of his speech, changes in his gait, headaches or confusion along with the symptoms.  Patient explains that his symptoms continued to persist throughout the day and overnight.  The patient reports that he did not seek medical attention the first day of symptoms due to the fact that he thought that they would quickly dissipate on their own.  By the following morning the patient's symptoms continued to persist which prompted the patient to present to [facility] emergency department for evaluation.  Upon evaluation in the emergency department an MRI of the brain was performed without contrast revealing 2 subcentimeter acute infarcts 1 within the anterior right frontal lobe periventricular white matter and the other within the left periatrial white matter.  Due to concerns for recurrent stroke the hospitalist group was then called to assess the patient for admission to the hospital.   Assessment & Plan Acute stroke due to ischemia  [facility]) Echocardiogram does not reveal a cardioembolic source  Review of telemetry overnight reveals no evidence of atrial fibrillation  CT angiogram of the head neck reveals no evidence of large vessel occlusion.   Continue to perform serial neurologic checks Monitoring patient on telemetry Continue Plavix as mentioned above LDL is less than 70, will continue statin therapy with atorvastatin 40 mg daily Speech therapy evaluation complete, recommending dysphagia 3 diet with thin liquids for degree of dysphagia PT evaluation complete, recommending acute inpatient rehab - sending request for consideration of admission to CIR at [facility] ending permissive hypertension  BPH without urinary obstruction or lower urinary tract symptoms Continue home regimen of Proscar and Flomax No evidence of lower urinary tract symptoms or obstruction at this time Generalized anxiety disorder Continue home regimen of Xanax nightly Mixed hyperlipidemia Continue atorvastatin 40 mg daily  Peripheral neuropathy Longstanding history of intractable neuropathic pain of the feet      Subjective:  Patient is complaining of bilateral foot pain, burning in quality associated with itching.  This is been ongoing for months to years.  Patient is also complaining of right-sided weakness that has been ongoing since admission.  Physical Exam:  Vitals:   03/17/24 1928 03/17/24 1951 03/17/24 2359 03/18/24 0403  BP:  138/65 (!) 150/75 (!) 150/78  Pulse:  (!) 52 73 74  Resp:  18 16 18   Temp:  98 F (36.7 C) 98 F (36.7 C) 98.2 F (36.8 C)  TempSrc:      SpO2:  99% 96% 96%  Weight: 73.5 kg     Height: 5\' 8"  (1.727 m)       Constitutional: Awake alert and oriented x3, no associated distress.  Skin: no rashes, no lesions, good skin turgor noted. Eyes: Pupils are equally reactive to light.  No evidence of scleral icterus or conjunctival pallor.  ENMT: Moist mucous membranes noted.  Posterior pharynx clear of any exudate  or lesions.   Respiratory: clear to auscultation bilaterally, no wheezing, no crackles. Normal respiratory effort. No accessory muscle use.  Cardiovascular: Regular rate and rhythm, no murmurs / rubs / gallops. No extremity edema. 2+ pedal pulses. No carotid bruits.  Abdomen: Abdomen is soft and nontender.  No evidence of intra-abdominal masses.  Positive bowel sounds noted in all quadrants.   Musculoskeletal: No joint deformity upper and lower extremities. Good ROM, no contractures. Normal muscle tone.  Neuro:   Discernible weakness of the right upper extremity in the proximal and distal muscle groups.  Notable reduced sensation of the bilateral lower extremities in a stocking-like distribution.  Otherwise, patient is following all commands and moving all 4 extremity spontaneously.   Data Reviewed:  I have personally reviewed and interpreted labs, imaging.  Significant findings are   CBC: Recent Labs  Lab 03/17/24 1225 03/18/24 0526  WBC 5.9 6.5  NEUTROABS 3.8 4.0  HGB 12.8* 12.5*  HCT 40.7 39.6  MCV 94.2 93.6  PLT 164 145*   Basic Metabolic Panel: Recent Labs  Lab 03/17/24 1225 03/18/24 0526  NA 138 137  K 3.9 3.7  CL 106 106  CO2 26 24  GLUCOSE 123* 90  BUN 26* 22  CREATININE 1.19 1.00  CALCIUM 9.0 8.9  MG  --  2.1   GFR: Estimated Creatinine Clearance: 49.4 mL/min (by C-G formula based on SCr of 1 mg/dL). Liver Function Tests: Recent Labs  Lab 03/17/24 1225 03/18/24 0526  AST 21 20  ALT 17 17  ALKPHOS 54 49  BILITOT 0.6 0.8  PROT 7.4 7.0  ALBUMIN 3.6 3.5    Coagulation Profile: No results for input(s): "INR", "PROTIME" in the last 168 hours.   Telemetry: Personally reviewed.  Rhythm is Normal sinus rhythm    Code Status:  DNR.  Code status decision has been confirmed with: patient Family Communication: Son is at the bedside who has been updated on plan of care.   Severity of Illness:  The appropriate patient status for this patient is  INPATIENT. Inpatient status is judged to be reasonable and necessary in order to provide the required intensity of service to ensure the patient's safety. The patient's presenting symptoms, physical exam findings, and initial radiographic and laboratory data in the context of their chronic comorbidities is felt to place them at high risk for further clinical deterioration. Furthermore, it is not anticipated that the patient will be medically stable for discharge from the hospital within 2 midnights of admission.   * I certify that at the point of admission it is my clinical judgment that the patient will require inpatient hospital care spanning beyond 2 midnights from the point of admission due to high intensity of service, high risk for further deterioration and high frequency of surveillance required.*  Time spent:  45 minutes  Author:  Marinda Elk MD  03/18/2024 9:12 AM

## 2024-03-18 NOTE — Evaluation (Signed)
 Occupational Therapy Evaluation Patient Details Name: James [name]. MRN: [id number] DOB: 1934/10/27 Today's Date: 03/18/2024   History of Present Illness   Pt is an 89yo male who presents with complaints of new onset weakness of the right side.  MRI brain without contrast revealing 2 subcentimeter acute infarcts 1 within the anterior right frontal lobe periventricular white matter and the other within the left periatrial white matter.  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 PTA pt lived with his wife at [facility]F and was Ind with ADLs/selfcare, used a Product manager for mobility, does not drive. Pt currently requires mod A with LB ADLs, mod A with sit-stand/SPTs using RW and mod A with toileting tasks. Pt reports previous success with AIR after previous stroke. Patient will benefit from intensive inpatient follow-up therapy, >3 hours/day. OT will follow acutely to maximize level of function and safety     If plan is discharge home, recommend the following:   A lot of help with bathing/dressing/bathroom;A lot of help with walking and/or transfers;Help with stairs or ramp for entrance     Functional Status Assessment   Patient has had a recent decline in their functional status and demonstrates the ability to make significant improvements in function in a reasonable and predictable amount of time.     Equipment Recommendations   None recommended by OT     Recommendations for Other Services         Precautions/Restrictions   Precautions Precautions: Fall Restrictions Weight Bearing Restrictions Per Provider Order: No     Mobility Bed Mobility                General bed mobility comments: pt in chair    Transfers Overall transfer level: Needs assistance Equipment used: Rolling walker (2 wheels) Transfers: Sit to/from Stand, Bed to chair/wheelchair/BSC Sit to Stand: Mod assist   Squat pivot transfers: Mod assist       General transfer comment: pt unsteady, knees buckling      Balance Overall balance assessment: Needs assistance Sitting-balance support: Feet supported Sitting balance-Leahy Scale: Fair     Standing balance support: During functional activity, Bilateral upper extremity supported, Reliant on assistive device for balance Standing balance-Leahy Scale: Poor                             ADL either performed or assessed with clinical judgement   ADL Overall ADL's : Needs assistance/impaired Eating/Feeding: Independent   Grooming: Wash/dry hands;Wash/dry face;Contact guard assist;Sitting   Upper Body Bathing: Contact guard assist;Sitting   Lower Body Bathing: Moderate assistance;Sitting/lateral leans   Upper Body Dressing : Contact guard assist;Sitting   Lower Body Dressing: Moderate assistance;Sitting/lateral leans;Sit to/from stand   Toilet Transfer: Moderate assistance;Stand-pivot;Rolling walker (2 wheels);Cueing for safety   Toileting- Clothing Manipulation and Hygiene: Moderate assistance;Sit to/from stand       Functional mobility during ADLs: Cueing for sequencing;Cueing for safety;Rolling walker (2 wheels);Moderate assistance       Vision Baseline Vision/History: 1 Wears glasses Ability to See in Adequate Light: 0 Adequate Patient Visual Report: No change from baseline       Perception         Praxis  Pertinent Vitals/Pain Pain Assessment Pain Assessment: 0-10 Pain Score: 6  Pain Location: feet and hands Pain Descriptors / Indicators: Numbness Pain Intervention(s): Limited activity within patient's tolerance, Monitored during session, Repositioned      Extremity/Trunk Assessment Upper Extremity Assessment Upper Extremity Assessment: Generalized weakness;Right hand dominant   Lower Extremity Assessment Lower Extremity Assessment: Defer to PT evaluation RLE Deficits / Details: grossly weak, ankle dorsiflexion past neutral, knee extension 3-/5, hip abduction 3/5, hip adduction 3/5, hip flexion 3+/5 RLE Sensation:  ("heavy feet") LLE Deficits / Details: grossly weak, ankle dorsiflexion past neutral, knee extension 3+/5, hip abduction 3/5, hip adduction 3/5, hip flexion 3-/5 LLE Sensation:  ("heavy feet")   Cervical / Trunk Assessment Cervical / Trunk Assessment: Kyphotic   Communication Communication Communication: No apparent difficulties   Cognition Arousal: Alert Behavior During Therapy: WFL for tasks assessed/performed Cognition: No apparent impairments                               Following commands: Intact       Cueing  General Comments          Exercises     Shoulder Instructions      Home Living Family/patient expects to be discharged to:: Assisted living   Available Help at Discharge: Family Type of Home: Apartment                       Home Equipment: Rollator (4 wheels);Shower seat;Grab bars - toilet;Grab bars - tub/shower   Additional Comments: Pt reports moved into [facility] in November 2024. Facility provides meals, pt's children provide transportation to grocery store and appointments.  Lives With: Spouse    Prior Functioning/Environment Prior Level of Function : Independent/Modified Independent             Mobility Comments: mod ind with rollator walker for facility distances ADLs Comments: mod ind with rollator walker for self care, wife drives    OT Problem List: Decreased strength;Impaired balance (sitting and/or standing);Pain;Decreased activity tolerance;Decreased coordination   OT Treatment/Interventions: Self-care/ADL training;DME and/or AE  instruction;Therapeutic activities;Balance training;Therapeutic exercise;Patient/family education      OT Goals(Current goals can be found in the care plan section)   Acute Rehab OT Goals Patient Stated Goal: get better OT Goal Formulation: With patient Time For Goal Achievement: 04/01/24 Potential to Achieve Goals: Good ADL Goals Pt Will Perform Grooming: with min assist;with contact guard assist;standing Pt Will Perform Upper Body Bathing: with supervision;sitting Pt Will Perform Lower Body Bathing: with min assist Pt Will Perform Upper Body Dressing: with supervision;sitting Pt Will Perform Lower Body Dressing: with min assist Pt Will Transfer to Toilet: with min assist;with contact guard assist;ambulating Pt Will Perform Toileting - Clothing Manipulation and hygiene: with min assist;with contact guard assist;sit to/from stand   OT Frequency:  Min 2X/week    Co-evaluation              AM-PAC OT "6 Clicks" Daily Activity     Outcome Measure Help from another person eating meals?: None Help from another person taking care of personal grooming?: A Little Help from another person toileting, which includes using toliet, bedpan, or urinal?: A Lot Help from another person bathing (including washing, rinsing, drying)?: A Lot Help from another person to put on and taking off regular upper body clothing?: A Little Help from another person to put on and taking off regular lower body clothing?: A Lot  6 Click Score: 16   End of Session Equipment Utilized During Treatment: Gait belt;Rolling walker (2 wheels)  Activity Tolerance: Patient limited by fatigue;Patient limited by pain Patient left: in chair;with call bell/phone within reach;with chair alarm set  OT Visit Diagnosis: Unsteadiness on feet (R26.81);Other abnormalities of gait and mobility (R26.89);Muscle weakness (generalized) (M62.81);Pain Pain - part of body: Hand;Ankle and joints of foot                Time:  1610-9604 OT Time Calculation (min): 28 min Charges:  OT General Charges $OT Visit: 1 Visit OT Evaluation $OT Eval Moderate Complexity: 1 Mod OT Treatments $Therapeutic Activity: 8-22 mins    Galen Manila 03/18/2024, 1:18 PM

## 2024-03-18 NOTE — Assessment & Plan Note (Signed)
 Continue home regimen of Proscar and Flomax No evidence of lower urinary tract symptoms or obstruction at this time

## 2024-03-18 NOTE — Evaluation (Signed)
 Clinical/Bedside Swallow Evaluation Patient Details  Name: James [name]. MRN: [id number] Date of Birth: 16-Jan-1934  Today's Date: 03/18/2024 Time: SLP Start Time (ACUTE ONLY): 7829 SLP Stop Time (ACUTE ONLY): 1016 SLP Time Calculation (min) (ACUTE ONLY): 20 min  Past Medical History:  Past Medical History:  Diagnosis Date   Anxiety    TIA (transient ischemic attack)    Past Surgical History:  Past Surgical History:  Procedure Laterality Date   HAND SURGERY Left    HEMORRHOID SURGERY     TONSILLECTOMY AND ADENOIDECTOMY     HPI:  89 yo male presenting 4/3 with new onset weakness of the R side. CXR on admission without acute findings. MRI showed two acute infarcts, one within the anterior right frontal lobe periventricular white matter and the other within the left periatrial white matter. Previous MBS 09/2023 revealed mild-moderate oropharyngeal dysphagia with reduced lingual control/coordination, disorganized tongue movement, incomplete epiglottic inversion, incomplete laryngeal vestibule closure/elevation, reduced pharyngeal stripping wave and tongue base retraction resulting in penetration of thin and nectar thick liquids and mild diffuse pharyngeal residuals. Penetration was deeper with thin liquids but there was more residue with nectar thick liquids. Dys 3 solids and thin liquids were recommended with use of 10cc provale cup and intermittent throat clear. He d/c from AIR on this diet, also needing supervision A for cognitive tasks. 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 He says that he has been drinking thin liquids at home without his Provale cup, and CXR  was clear upon admission. Will start PO diet but given acute stroke and concern for coughing with nurse last night, SLP f/u is still warranted. Would begin with Dys 3 (mech soft) diet and thin liquids, which is what was previously recommended for him. Pt in agreement.   SLP Visit Diagnosis: Dysphagia, unspecified (R13.10)    Aspiration Risk       Diet Recommendation Dysphagia 3 (Mech soft);Thin liquid    Liquid Administration via: Cup;Straw Medication Administration: Whole meds with liquid (use puree PRN) Supervision: Patient able to self feed;Intermittent supervision to cue for compensatory strategies Compensations: Slow rate;Small sips/bites Postural Changes: Seated upright at 90 degrees    Other  Recommendations Oral Care Recommendations: Oral care BID    Recommendations for follow up therapy are one component of a multi-disciplinary discharge planning process, led by the attending physician.  Recommendations may be updated based on patient status, additional functional criteria and insurance authorization.  Follow up Recommendations Outpatient SLP (SLP at next level of care)      Assistance Recommended at Discharge    Functional Status Assessment Patient has had a recent decline in their functional status and demonstrates the ability to make significant improvements in function in a reasonable and predictable amount of time.  Frequency and Duration min 2x/week  1 week       Prognosis Prognosis for improved oropharyngeal function: Good      Swallow Study   General HPI: 89 yo male presenting 4/3 with new onset weakness of the R side. CXR on admission without acute findings. MRI showed two acute infarcts, one within the anterior right frontal lobe periventricular white matter and the other within the left periatrial white matter. Previous  MBS 09/2023 revealed mild-moderate oropharyngeal dysphagia with reduced lingual control/coordination, disorganized tongue movement, incomplete  epiglottic inversion, incomplete laryngeal vestibule closure/elevation, reduced pharyngeal stripping wave and tongue base retraction resulting in penetration of thin and nectar thick liquids and mild diffuse pharyngeal residuals. Penetration was deeper with thin liquids but there was more residue with nectar thick liquids. Dys 3 solids and thin liquids were recommended with use of 10cc provale cup and intermittent throat clear. He d/c from AIR on this diet, also needing supervision A for cognitive tasks. 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), BPH Type of Study: Bedside Swallow Evaluation Previous Swallow Assessment: see HPI Diet Prior to this Study: NPO Temperature Spikes Noted: No Respiratory Status: Room air History of Recent Intubation: No Behavior/Cognition: Alert;Cooperative;Pleasant mood Oral Cavity Assessment: Within Functional Limits Oral Care Completed by SLP: No Oral Cavity - Dentition: Adequate natural dentition Vision: Functional for self-feeding Self-Feeding Abilities: Able to feed self Patient Positioning: Upright in bed Baseline Vocal Quality: Normal Volitional Cough: Strong Volitional Swallow: Able to elicit    Oral/Motor/Sensory Function Overall Oral Motor/Sensory Function: Within functional limits   Ice Chips Ice chips: Not tested   Thin Liquid Thin Liquid: Within functional limits Presentation: Cup;Self Fed;Straw    Nectar Thick Nectar Thick Liquid: Not tested   Honey Thick Honey Thick Liquid: Not tested   Puree Puree: Within functional limits Presentation: Self Fed;Spoon   Solid     Solid: Within functional limits Presentation: Self Fed      Mahala Menghini., M.A. CCC-SLP Acute Rehabilitation Services Office [phone]  Secure chat preferred  03/18/2024,10:53 AM

## 2024-03-18 NOTE — Assessment & Plan Note (Signed)
 Continue home regimen of Xanax nightly

## 2024-03-18 NOTE — Assessment & Plan Note (Addendum)
 Continue atorvastatin 40 mg daily.

## 2024-03-18 NOTE — Progress Notes (Signed)
 Patient failed the swallow test. The patient was able to drink 3 oz of water but coughed slightly during and after the process. The on-call provider has been notified, and a SLP order is in place.

## 2024-03-18 NOTE — Assessment & Plan Note (Signed)
 Longstanding history of intractable neuropathic pain of the feet

## 2024-03-18 NOTE — Progress Notes (Signed)
*  PRELIMINARY RESULTS* Echocardiogram 2D Echocardiogram has been performed.  Rosemary Holms 03/18/2024, 3:35 PM

## 2024-03-18 NOTE — TOC Initial Note (Signed)
 Transition of Care (TOC) - Initial/Assessment Note    Patient Details  Name: James [name]. MRN: [id number] Date of Birth: 1934-06-14  Transition of [facility]) CM/SW Contact:    Otelia Santee, LCSW Phone Number: 03/18/2024, 3:37 PM  Clinical Narrative:                 Pt from [facility] where he resides with his spouse. Pt has rollator at home. Pt is recommended for CIR. Pt hesitant to go to rehab as he did not like his previous experience. CSW, pt's son and daughter discussed discharge options with pt. Pt begrudgingly accepted plan for CIR at discharge prior to returning to his apartment at [facility].  TOC will follow for CIR evaluation of pt to determine if they are able to extend bed offer.    Expected Discharge Plan: IP Rehab Facility Barriers to Discharge: Other (must enter comment) (CIR approval, insurance authorization)   Patient Goals and CMS Choice Patient states their goals for this hospitalization and ongoing recovery are:: To go to CIR CMS Medicare.gov Compare Post Acute Care list provided to:: Patient Choice offered to / list presented to : Patient [place] ownership interest in [facility].provided to:: Patient    Expected Discharge Plan and Services In-house Referral: Clinical Social Work Discharge Planning Services: NA Post Acute Care Choice: IP Rehab Living arrangements for the past 2 months: Independent Living Facility                 DME Arranged: N/A DME Agency: NA                  Prior Living Arrangements/Services Living arrangements for the past 2 months: Independent Living Facility Lives with:: Spouse Patient language and need for interpreter reviewed:: Yes Do you feel safe going back to the place where you live?: Yes      Need for Family Participation in Patient Care: No (Comment) Care giver support system in place?: Yes (comment) Current home services: DME (Rollator) Criminal Activity/Legal Involvement Pertinent to  Current Situation/Hospitalization: No - Comment as needed  Activities of Daily Living   ADL Screening (condition at time of admission) Independently performs ADLs?: No Does the patient have a NEW difficulty with bathing/dressing/toileting/self-feeding that is expected to last >3 days?: No Does the patient have a NEW difficulty with getting in/out of bed, walking, or climbing stairs that is expected to last >3 days?: No Does the patient have a NEW difficulty with communication that is expected to last >3 days?: No Is the patient deaf or have difficulty hearing?: No Does the patient have difficulty seeing, even when wearing glasses/contacts?: No Does the patient have difficulty concentrating, remembering, or making decisions?: No  Permission Sought/Granted Permission sought to share information with : Facility Medical sales representative, Family Supports Permission granted to share information with : Yes, Verbal Permission Granted  Share Information with NAME: JUDD, MCCUBBIN (Spouse)  [phone]  Permission granted to share info w AGENCY: CIR; [facility]        Emotional Assessment Appearance:: Appears stated age Attitude/Demeanor/Rapport: Engaged Affect (typically observed): Accepting Orientation: : Oriented to Self, Oriented to Place, Oriented to  Time, Oriented to Situation Alcohol / Substance Use: Not Applicable Psych Involvement: No (comment)  Admission diagnosis:  Acute CVA (cerebrovascular accident) [facility]) [I63.9] Acute stroke due to ischemia [facility]) [I63.9] Patient Active Problem List   Diagnosis Date Noted   Acute stroke due to ischemia (HCC) 03/17/2024   BPH without urinary obstruction or  lower urinary tract symptoms 03/17/2024   Generalized anxiety disorder 03/17/2024   Fall 09/08/2023   Right thalamic infarction (HCC) 09/08/2023   Cerebrovascular accident (CVA) (HCC) 09/03/2023   Acute thalamic infarction (HCC) 09/02/2023   Descending aortic arch aneurysm (HCC)  09/02/2023   PAD (peripheral artery disease) (HCC) 11/29/2019   Polyneuropathy 04/27/2019   Anxiety 07/05/2014   GERD (gastroesophageal reflux disease) 07/05/2014   HTN (hypertension) 07/05/2014   Mixed hyperlipidemia 07/05/2014   Late effect of lacunar infarction 07/05/2014   OA (osteoarthritis) 07/05/2014   Benign localized prostatic hyperplasia with lower urinary tract symptoms (LUTS) 12/23/2012   Family history of malignant neoplasm of prostate 12/23/2012   Incomplete emptying of bladder 12/23/2012   Kidney stone 12/23/2012   PCP:  Lorenda Ishihara, MD Pharmacy:   [facility] [phone] [place], [place] - [address] [place] [address] Phone: [phone] Fax: [phone]  [facility] - [place], [place] - [address] [place] [address] Phone: [phone] Fax: [phone]  [facility] [address] [place] [address] Phone: [phone] Fax: [phone]  [id number] - [place], [place] - [address] AT CORNER OF [facility] [address] [place] [address] Phone: [phone] Fax: [phone]     Social Drivers of Health (SDOH) Social History: SDOH Screenings   Food Insecurity: No Food Insecurity (03/17/2024)  Housing: Low Risk  (03/17/2024)  Transportation Needs: No Transportation Needs (03/17/2024)  Utilities: Not At Risk (03/17/2024)  Depression (PHQ2-9): Low Risk  (10/02/2023)  Social Connections: Moderately Integrated (03/18/2024)  Tobacco Use: Medium Risk (03/17/2024)   SDOH Interventions:     Readmission Risk Interventions     No data to display

## 2024-03-18 NOTE — Evaluation (Signed)
 Speech Language Pathology Evaluation Patient Details Name: James [name]. 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   PAD (peripheral artery disease) (HCC) 11/29/2019   Polyneuropathy 04/27/2019   Anxiety 07/05/2014   GERD (gastroesophageal reflux disease) 07/05/2014   HTN (hypertension) 07/05/2014   Mixed hyperlipidemia 07/05/2014   Late effect of lacunar infarction 07/05/2014   OA (osteoarthritis) 07/05/2014   Benign localized prostatic hyperplasia with lower urinary tract symptoms (LUTS) 12/23/2012   Family history of malignant neoplasm of prostate 12/23/2012   Incomplete emptying of bladder 12/23/2012   Kidney stone 12/23/2012   Past Medical History:  Past Medical History:  Diagnosis Date   Anxiety    TIA (transient ischemic attack)    Past Surgical History:  Past Surgical History:  Procedure Laterality Date   HAND SURGERY Left    HEMORRHOID SURGERY     TONSILLECTOMY AND ADENOIDECTOMY     HPI:  89 yo male presenting 4/3 with new onset weakness of the R side. CXR on admission without acute findings. MRI showed two acute infarcts, one within the anterior right frontal lobe periventricular white matter and the other within the left periatrial white matter. Previous MBS 09/2023 revealed mild-moderate oropharyngeal dysphagia with reduced lingual control/coordination, disorganized tongue movement, incomplete epiglottic inversion, incomplete laryngeal  vestibule closure/elevation, reduced pharyngeal stripping wave and tongue base retraction resulting in penetration of thin and nectar thick liquids and mild diffuse pharyngeal residuals. Penetration was deeper with thin liquids but there was more residue with nectar thick liquids. Dys 3 solids and thin liquids were recommended with use of 10cc provale cup and intermittent throat clear. He d/c from [facility] on this diet, also needing supervision A for cognitive tasks. 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), BPH   Assessment / Plan / Recommendation Clinical Impression  Pt's speech is clear but he does have moments of occasional word-finding difficulty, for which he usually self-corrects. Although he is still conversational, language was documented to be functional during previous SLP eval so suspect that this may be an acute change. Pt also had a few moments in which he had difficulty with longer-term recall (question if this is more baseline) and need Min cues for delayed recall task. He demonstrated good accuracy with verbal problem solving but also had slower processing and asked for repetition of instructions. Pt may not be too far from his baseline but do recommend SLP f/u for further, higher-level cognitive-linguistic testing given level of independence PTA. Pt does report good family support at d/c, including his wife at home as well as three children that live fairly locally.     SLP Assessment  SLP Recommendation/Assessment: Patient needs continued Speech Lanaguage Pathology Services SLP Visit Diagnosis: Cognitive communication deficit (R41.841)    Recommendations for follow up therapy are one component of a multi-disciplinary discharge planning process, led by the attending physician.  Recommendations may be updated based on patient status, additional functional criteria and insurance  authorization.    Follow Up Recommendations  Outpatient SLP (SLP at next level of care)    Assistance Recommended at Discharge  Intermittent Supervision/Assistance  Functional Status Assessment Patient has had a recent decline in their functional status and demonstrates the ability to make significant improvements in function in a reasonable and predictable amount of time.  Frequency and Duration min 2x/week  1 week      SLP Evaluation Cognition  Overall Cognitive Status: Impaired/Different from baseline Arousal/Alertness: Awake/alert Orientation Level: Oriented to person;Oriented to situation Attention: Sustained Sustained Attention: Appears intact Memory: Impaired Memory Impairment: Retrieval deficit Awareness: Appears intact Problem Solving: Appears intact Safety/Judgment: Appears intact       Comprehension  Auditory Comprehension Overall Auditory Comprehension: Appears within functional limits for tasks assessed    Expression Expression Primary Mode of Expression: Verbal Verbal Expression Overall Verbal Expression: Impaired Initiation: No impairment Naming: Impairment Verbal Errors: Other (comment) (word finding in conversation) Pragmatics: No impairment Non-Verbal Means of Communication: Not applicable   Oral / Motor  Oral Motor/Sensory Function Overall Oral Motor/Sensory Function: Within functional limits Motor Speech Overall Motor Speech: Appears within functional limits for tasks assessed            Mahala Menghini., M.A. CCC-SLP Acute Rehabilitation Services Office [phone]  Secure chat preferred  03/18/2024, 10:59 AM

## 2024-03-18 NOTE — Progress Notes (Signed)
  Inpatient Rehab Admissions Coordinator :  Per therapy recommendations, patient was screened for CIR candidacy by Ottie Glazier RN MSN.  At this time patient appears to be a potential candidate for CIR. I will place a rehab consult per protocol for full assessment. Please call me with any questions.  Ottie Glazier RN MSN Admissions Coordinator [phone]

## 2024-03-18 NOTE — Evaluation (Signed)
 Physical Therapy Evaluation Patient Details Name: James [name]. MRN: [id number] DOB: 1934/06/27 Today's Date: 03/18/2024  History of Present Illness  Pt is an 89yo male who presents with complaints of new onset weakness of the right side.  MRI brain without contrast revealing 2 subcentimeter acute infarcts 1 within the anterior right frontal lobe periventricular white matter and the other within the left periatrial white matter.  PMH: anxiety disorder, peripheral artery disease, descending aortic aneurysm (3.5cm 08/2023), BPH, and hospitalization at [facility] 08/2023 for left-sided weakness acute right-sided multifocal stroke  Clinical Impression  Pt admitted with above diagnosis. Pt reports from [facility], using rollator for facility amb, ind with all self care, spouse completes driving and household chores. On eval, pt needing mod A with increased time and effort to slide out to EOB. With STS pt pulling from braced RW, slow to power up, increased unsteadiness in standing, LE buckling and pt needing assisted back to sitting EOB. Therapist assisted pt in squat pivot to drop arm recliner at bedside, increased time, therapist blocking bil knees from buckling , verbal ces for hand placement, mod A to complete. Pt reports previous success with [facility] after previous stroke. Patient will benefit from intensive inpatient follow-up therapy, >3 hours/day. Pt currently with functional limitations due to the deficits listed below (see PT Problem List). Pt will benefit from acute skilled PT to increase their independence and safety with mobility to allow discharge.           If plan is discharge home, recommend the following: A lot of help with walking and/or transfers;A lot of help with bathing/dressing/bathroom;Assistance with cooking/housework;Assist for transportation   Can travel by private vehicle        Equipment Recommendations None recommended by PT  Recommendations for Other  Services       Functional Status Assessment Patient has had a recent decline in their functional status and demonstrates the ability to make significant improvements in function in a reasonable and predictable amount of time.     Precautions / Restrictions Precautions Precautions: Fall Restrictions Weight Bearing Restrictions Per Provider Order: No      Mobility  Bed Mobility Overal bed mobility: Needs Assistance Bed Mobility: Supine to Sit     Supine to sit: Mod assist, HOB elevated, Used rails     General bed mobility comments: significantly increased time, pt able to slide BLE to EOB and using bedrail to upright trunk into sitting, verbal cues to push with BUE to assist in scooting forward, mod A to slide out    Transfers Overall transfer level: Needs assistance Equipment used: Rolling walker (2 wheels), 1 person hand held assist Transfers: Sit to/from Stand, Bed to chair/wheelchair/BSC Sit to Stand: Mod assist     Squat pivot transfers: Mod assist     General transfer comment: mod A to power to stand, slow to rise up, pulling from braced RW, once in standing BLE shakiness and unsteadiness with LE buckling and assisted pt back to sitting, maintains trunk forward flexed; completed squat pivot bed to drop arm recliner with mod A, therapist positioned anterior to pt blocking BLE knees from buckling, pt with hands on bed and recliner to assist in steadying self    Ambulation/Gait                  Stairs            Wheelchair Mobility     Tilt Bed    Modified Rankin (  Stroke Patients Only)       Balance Overall balance assessment: Needs assistance Sitting-balance support: Feet supported Sitting balance-Leahy Scale: Fair Sitting balance - Comments: close supv seated EOB, no formal LOB any direction but prefers UEs on bed to supposed self   Standing balance support: During functional activity, Bilateral upper extremity supported Standing  balance-Leahy Scale: Poor                               Pertinent Vitals/Pain Pain Assessment Pain Assessment: 0-10 Pain Score: 8  Pain Location: feet Pain Descriptors / Indicators: Heaviness Pain Intervention(s): Limited activity within patient's tolerance, Monitored during session, Repositioned    Home Living Family/patient expects to be discharged to:: Assisted living   Available Help at Discharge: Family Type of Home: Apartment           Home Equipment: Rollator (4 wheels);Shower seat;Grab bars - toilet;Grab bars - tub/shower Additional Comments: Pt reports moved into [facility] in November 2024. Facility provides meals, pt's children provide transportation to grocery store and appointments.    Prior Function Prior Level of Function : Independent/Modified Independent             Mobility Comments: mod ind with rollator walker for facility distances ADLs Comments: mod ind with rollator walker for self care     Extremity/Trunk Assessment   Upper Extremity Assessment Upper Extremity Assessment: Defer to OT evaluation    Lower Extremity Assessment Lower Extremity Assessment: RLE deficits/detail;LLE deficits/detail RLE Deficits / Details: grossly weak, ankle dorsiflexion past neutral, knee extension 3-/5, hip abduction 3/5, hip adduction 3/5, hip flexion 3+/5 RLE Sensation:  ("heavy feet") LLE Deficits / Details: grossly weak, ankle dorsiflexion past neutral, knee extension 3+/5, hip abduction 3/5, hip adduction 3/5, hip flexion 3-/5 LLE Sensation:  ("heavy feet")    Cervical / Trunk Assessment Cervical / Trunk Assessment: Kyphotic  Communication   Communication Communication: No apparent difficulties    Cognition Arousal: Alert Behavior During Therapy: WFL for tasks assessed/performed                           PT - Cognition Comments: pt slow to respond to questions and reports feeling slower than normal, pleasant, follows 1 step  commands Following commands: Intact       Cueing       General Comments      Exercises     Assessment/Plan    PT Assessment Patient needs continued PT services  PT Problem List Decreased strength;Decreased range of motion;Decreased activity tolerance;Decreased balance;Decreased mobility;Decreased knowledge of use of DME;Decreased safety awareness;Impaired sensation       PT Treatment Interventions DME instruction;Gait training;Functional mobility training;Therapeutic activities;Therapeutic exercise;Balance training;Neuromuscular re-education;Cognitive remediation;Patient/family education    PT Goals (Current goals can be found in the Care Plan section)  Acute Rehab PT Goals Patient Stated Goal: agreeable to therapy PT Goal Formulation: With patient Time For Goal Achievement: 04/01/24 Potential to Achieve Goals: Good    Frequency Min 3X/week     Co-evaluation               AM-PAC PT "6 Clicks" Mobility  Outcome Measure Help needed turning from your back to your side while in a flat bed without using bedrails?: A Lot Help needed moving from lying on your back to sitting on the side of a flat bed without using bedrails?: A Lot Help needed moving to and  from a bed to a chair (including a wheelchair)?: A Lot Help needed standing up from a chair using your arms (e.g., wheelchair or bedside chair)?: A Lot Help needed to walk in hospital room?: Total Help needed climbing 3-5 steps with a railing? : Total 6 Click Score: 10    End of Session Equipment Utilized During Treatment: Gait belt Activity Tolerance: Patient tolerated treatment well Patient left: in chair;with call bell/phone within reach Nurse Communication: Mobility status PT Visit Diagnosis: Unsteadiness on feet (R26.81);Other abnormalities of gait and mobility (R26.89);Muscle weakness (generalized) (M62.81);Difficulty in walking, not elsewhere classified (R26.2);Other symptoms and signs involving the nervous  system (R29.898)    Time: 1610-9604 PT Time Calculation (min) (ACUTE ONLY): 24 min   Charges:   PT Evaluation $PT Eval Low Complexity: 1 Low PT Treatments $Therapeutic Activity: 8-22 mins PT General Charges $$ ACUTE PT VISIT: 1 Visit          Tori Kynisha Memon PT, DPT 03/18/24, 12:56 PM

## 2024-03-19 DIAGNOSIS — G928 Other toxic encephalopathy: Secondary | ICD-10-CM | POA: Insufficient documentation

## 2024-03-19 DIAGNOSIS — E782 Mixed hyperlipidemia: Secondary | ICD-10-CM | POA: Diagnosis not present

## 2024-03-19 DIAGNOSIS — I639 Cerebral infarction, unspecified: Secondary | ICD-10-CM | POA: Diagnosis not present

## 2024-03-19 DIAGNOSIS — N400 Enlarged prostate without lower urinary tract symptoms: Secondary | ICD-10-CM | POA: Diagnosis not present

## 2024-03-19 DIAGNOSIS — F411 Generalized anxiety disorder: Secondary | ICD-10-CM | POA: Diagnosis not present

## 2024-03-19 LAB — COMPREHENSIVE METABOLIC PANEL WITH GFR
ALT: 15 U/L (ref 0–44)
AST: 22 U/L (ref 15–41)
Albumin: 3.7 g/dL (ref 3.5–5.0)
Alkaline Phosphatase: 53 U/L (ref 38–126)
Anion gap: 9 (ref 5–15)
BUN: 27 mg/dL — ABNORMAL HIGH (ref 8–23)
CO2: 24 mmol/L (ref 22–32)
Calcium: 9 mg/dL (ref 8.9–10.3)
Chloride: 103 mmol/L (ref 98–111)
Creatinine, Ser: 1.22 mg/dL (ref 0.61–1.24)
GFR, Estimated: 57 mL/min — ABNORMAL LOW (ref >60)
Glucose, Bld: 96 mg/dL (ref 70–99)
Potassium: 3.5 mmol/L (ref 3.5–5.1)
Sodium: 136 mmol/L (ref 135–145)
Total Bilirubin: 0.8 mg/dL (ref 0.0–1.2)
Total Protein: 7.4 g/dL (ref 6.5–8.1)

## 2024-03-19 LAB — HEMOGLOBIN A1C
Hgb A1c MFr Bld: 5.7 % — ABNORMAL HIGH (ref 4.8–5.6)
Mean Plasma Glucose: 117 mg/dL

## 2024-03-19 LAB — CBC WITH DIFFERENTIAL/PLATELET
Abs Immature Granulocytes: 0.02 10*3/uL (ref 0.00–0.07)
Basophils Absolute: 0.1 10*3/uL (ref 0.0–0.1)
Basophils Relative: 1 %
Eosinophils Absolute: 0.3 10*3/uL (ref 0.0–0.5)
Eosinophils Relative: 6 %
HCT: 41.4 % (ref 39.0–52.0)
Hemoglobin: 13.5 g/dL (ref 13.0–17.0)
Immature Granulocytes: 0 %
Lymphocytes Relative: 27 %
Lymphs Abs: 1.5 10*3/uL (ref 0.7–4.0)
MCH: 30.5 pg (ref 26.0–34.0)
MCHC: 32.6 g/dL (ref 30.0–36.0)
MCV: 93.5 fL (ref 80.0–100.0)
Monocytes Absolute: 0.6 10*3/uL (ref 0.1–1.0)
Monocytes Relative: 10 %
Neutro Abs: 3.2 10*3/uL (ref 1.7–7.7)
Neutrophils Relative %: 56 %
Platelets: 145 10*3/uL — ABNORMAL LOW (ref 150–400)
RBC: 4.43 MIL/uL (ref 4.22–5.81)
RDW: 13.7 % (ref 11.5–15.5)
WBC: 5.7 10*3/uL (ref 4.0–10.5)
nRBC: 0 % (ref 0.0–0.2)

## 2024-03-19 LAB — MAGNESIUM: Magnesium: 2.3 mg/dL (ref 1.7–2.4)

## 2024-03-19 MED ORDER — ALPRAZOLAM 0.5 MG PO TABS
0.5000 mg | ORAL_TABLET | Freq: Every evening | ORAL | Status: DC | PRN
  Administered 2024-03-19: 0.5 mg via ORAL
  Filled 2024-03-19: qty 1

## 2024-03-19 NOTE — Plan of Care (Signed)

## 2024-03-19 NOTE — Assessment & Plan Note (Addendum)
 Echocardiogram does not reveal a cardioembolic source  Review of telemetry overnight reveals no evidence of atrial fibrillation  CT angiogram of the head neck reveals no evidence of large vessel occlusion.   Continue to perform serial neurologic checks Monitoring patient on telemetry Continue Plavix as mentioned above LDL is less than 70, will continue statin therapy with atorvastatin 40 mg daily Speech therapy evaluation complete, recommended dysphagia 3 diet with thin liquids for degree of dysphagia PT evaluation complete, patient is actively being evaluated for admission to CIR and is awaiting insurance authorization.

## 2024-03-19 NOTE — Progress Notes (Signed)
 Pt drowsy since night-time medications. MD notified of AMS/drowsiness. RR RN called to offer assessment. Pt still difficult to arouse and drifts back to sleep quickly. He is moving all extremities at baseline and follows commands with repeated stimulation. Pupils equal and reactive. Pt able to state name, month, and age (reluctantly) and expresses that he is in no pain and wants to be left alone by nodding his head. Plan of care ongoing. See MAR for dc'd medications.

## 2024-03-19 NOTE — Assessment & Plan Note (Signed)
 Continue atorvastatin 40 mg daily.

## 2024-03-19 NOTE — Assessment & Plan Note (Signed)
 Patient exhibited significant confusion that started yesterday evening after patient was administered both his usual home regimen of Xanax and a new regimen of low-dose Lyrica for his neuropathy. I suspect the Lyrica contributed to the patient's significant confusion.  This is being held. Will change Xanax to as needed and cut dose in half Monitor for return of mentation to baseline

## 2024-03-19 NOTE — Plan of Care (Signed)

## 2024-03-19 NOTE — PMR Pre-admission (Signed)
 PMR Admission Coordinator Pre-Admission Assessment  Patient: James [name]. is an 89 y.o., male MRN: [id number] DOB: 14-Aug-1934 Height: 5\' 8"  (172.7 cm) Weight: 73.5 kg  Insurance Information HMO: ***    PPO: ***     PCP:      IPA:      80/20:      OTHER:  PRIMARY: [employer] Medicare      Policy#: [id number]      Subscriber: patient CM Name: ***      Phone#: ***     Fax#: *** Pre-Cert#: ***      Employer: *** Benefits:  Phone #: ***     Name: *** Dolores Hoose. Date: ***     Deduct: ***      Out of Pocket Max: ***      Life Max: *** CIR: ***      SNF: *** Outpatient: ***     Co-Pay: *** Home Health: ***      Co-Pay: *** DME: ***     Co-Pay: *** Providers: in-network SECONDARY:       Policy#:      Phone#:   Financial Counselor:       Phone#:   The Data processing manager" for patients in Inpatient Rehabilitation Facilities with attached "Privacy Act Statement-Health Care Records" was provided and verbally reviewed with: {CHL IP Patient Family VO:[id number]}  Emergency Contact Information Contact Information     Name Relation Home Work [place] [phone]   Snell,Margret Daughter   [phone]   Raden, [place]ington Daughter   [phone]   Milas Kocher   [phone]      Other Contacts   None on File     Current Medical History  Patient Admitting Diagnosis: CVA History of Present Illness: Pt is an 89 year old male with medical hx significant for: anxiety disorder, PAD, descending aortic aneurysm, 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 Pt presented to [facility] on 03/17/24 d/t new onset of right-sided weakness. MRI revealed 2 subcentimeter acute infarcts one within the anterior right frontal lobe periventricular white matter and the other within the left periatrial white matter. Pt not a candidate for tPA. CTA negative for LVO. Therapy evaluations completed and CIR recommended d/t pt's  deficits in functional mobility.  Complete NIHSS TOTAL: 1  Patient's medical record from [facility] has been reviewed by the rehabilitation admission coordinator and physician.  Past Medical History  Past Medical History:  Diagnosis Date   Anxiety    TIA (transient ischemic attack)     Has the patient had major surgery during 100 days prior to admission? No  Family History   family history includes Prostate cancer in his father.  Current Medications  Current Facility-Administered Medications:    acetaminophen (TYLENOL) tablet 650 mg, 650 mg, Oral, Q6H PRN **OR** acetaminophen (TYLENOL) suppository 650 mg, 650 mg, Rectal, Q6H PRN, Shalhoub, Deno Lunger, MD   ALPRAZolam Prudy Feeler) tablet 0.5 mg, 0.5 mg, Oral, QHS PRN, Shalhoub, Deno Lunger, MD   atorvastatin (LIPITOR) tablet 40 mg, 40 mg, Oral, Daily, Shalhoub, Deno Lunger, MD, 40 mg at 03/19/24 5176   clopidogrel (PLAVIX) tablet 75 mg, 75 mg, Oral, Daily, Shalhoub, Deno Lunger, MD, 75 mg at 03/19/24 1607   docusate sodium (COLACE) capsule 100 mg, 100 mg, Oral, Daily, Shalhoub, Deno Lunger, MD, 100 mg at 03/19/24 0922   enoxaparin (LOVENOX) injection 40 mg, 40 mg, Subcutaneous, Q24H, Shalhoub, Deno Lunger, MD, 40 mg at 03/18/24 2140  finasteride (PROSCAR) tablet 5 mg, 5 mg, Oral, Daily, Shalhoub, Deno Lunger, MD, 5 mg at 03/19/24 9528   hydrALAZINE (APRESOLINE) injection 10 mg, 10 mg, Intravenous, Q6H PRN, Shalhoub, Deno Lunger, MD   influenza vaccine adjuvanted (FLUAD) injection 0.5 mL, 0.5 mL, Intramuscular, Tomorrow-1000, Shalhoub, Deno Lunger, MD   ondansetron (ZOFRAN) tablet 4 mg, 4 mg, Oral, Q6H PRN **OR** ondansetron (ZOFRAN) injection 4 mg, 4 mg, Intravenous, Q6H PRN, Shalhoub, Deno Lunger, MD   pneumococcal 20-valent conjugate vaccine (PREVNAR 20) injection 0.5 mL, 0.5 mL, Intramuscular, Tomorrow-1000, Shalhoub, Deno Lunger, MD   polyethylene glycol (MIRALAX / GLYCOLAX) packet 17 g, 17 g, Oral, Daily PRN, Shalhoub, Deno Lunger, MD   tamsulosin [facility])  capsule 0.4 mg, 0.4 mg, Oral, Daily, Shalhoub, Deno Lunger, MD, 0.4 mg at 03/19/24 4132  Patients Current Diet:  Diet Order             DIET DYS 3 Room service appropriate? Yes; Fluid consistency: Thin  Diet effective now                   Precautions / Restrictions Precautions Precautions: Fall Restrictions Weight Bearing Restrictions Per Provider Order: No   Has the patient had 2 or more falls or a fall with injury in the past year? No  Prior Activity Level Limited Community (1-2x/wk): MD appointments  Prior Functional Level Self Care: Did the patient need help bathing, dressing, using the toilet or eating? Independent  Indoor Mobility: Did the patient need assistance with walking from room to room (with or without device)? Independent  Stairs: Did the patient need assistance with internal or external stairs (with or without device)? Unknown (pt avoids stairs)  Functional Cognition: Did the patient need help planning regular tasks such as shopping or remembering to take medications? Independent  Patient Information Are you of Hispanic, Latino/a,or Spanish origin?: X. Patient unable to respond, A. No, not of Hispanic, Latino/a, or Spanish origin What is your race?: X. Patient unable to respond, A. White Do you need or want an interpreter to communicate with a doctor or health care staff?: 9. Unable to respond  Patient's Response To:  Health Literacy and Transportation Is the patient able to respond to health literacy and transportation needs?: No Health Literacy - How often do you need to have someone help you when you read instructions, pamphlets, or other written material from your doctor or pharmacy?: Patient unable to respond  Home Assistive Devices / Equipment Home Equipment: Rollator (4 wheels), Shower seat, Grab bars - toilet, Grab bars - tub/shower  Prior Device Use: Indicate devices/aids used by the patient prior to current illness, exacerbation or injury?  Walker  Current Functional Level Cognition  Arousal/Alertness: Awake/alert Overall Cognitive Status: Impaired/Different from baseline Orientation Level: Oriented to person, Oriented to place, Oriented to situation Attention: Sustained Sustained Attention: Appears intact Memory: Impaired Memory Impairment: Retrieval deficit Awareness: Appears intact Problem Solving: Appears intact Safety/Judgment: Appears intact    Extremity Assessment (includes Sensation/Coordination)  Upper Extremity Assessment: Generalized weakness, Right hand dominant  Lower Extremity Assessment: Defer to PT evaluation RLE Deficits / Details: grossly weak, ankle dorsiflexion past neutral, knee extension 3-/5, hip abduction 3/5, hip adduction 3/5, hip flexion 3+/5 RLE Sensation:  ("heavy feet") LLE Deficits / Details: grossly weak, ankle dorsiflexion past neutral, knee extension 3+/5, hip abduction 3/5, hip adduction 3/5, hip flexion 3-/5 LLE Sensation:  ("heavy feet")    ADLs  Overall ADL's : Needs assistance/impaired Eating/Feeding: Independent Grooming: Wash/dry hands, Wash/dry face, Contact  guard assist, Sitting Upper Body Bathing: Contact guard assist, Sitting Lower Body Bathing: Moderate assistance, Sitting/lateral leans Upper Body Dressing : Contact guard assist, Sitting Lower Body Dressing: Moderate assistance, Sitting/lateral leans, Sit to/from stand Toilet Transfer: Moderate assistance, Stand-pivot, Rolling walker (2 wheels), Cueing for safety Toileting- Clothing Manipulation and Hygiene: Moderate assistance, Sit to/from stand Functional mobility during ADLs: Cueing for sequencing, Cueing for safety, Rolling walker (2 wheels), Moderate assistance    Mobility  Overal bed mobility: Needs Assistance Bed Mobility: Supine to Sit Supine to sit: Mod assist, HOB elevated, Used rails General bed mobility comments: pt in chair    Transfers  Overall transfer level: Needs assistance Equipment used: Rolling  walker (2 wheels) Transfers: Sit to/from Stand, Bed to chair/wheelchair/BSC Sit to Stand: Mod assist Bed to/from chair/wheelchair/BSC transfer type:: Stand pivot Squat pivot transfers: Mod assist General transfer comment: pt unsteady, knees buckling    Ambulation / Gait / Stairs / Wheelchair Mobility       Posture / Balance Dynamic Sitting Balance Sitting balance - Comments: close supv seated EOB, no formal LOB any direction but prefers UEs on bed to supposed self Balance Overall balance assessment: Needs assistance Sitting-balance support: Feet supported Sitting balance-Leahy Scale: Fair Sitting balance - Comments: close supv seated EOB, no formal LOB any direction but prefers UEs on bed to supposed self Standing balance support: During functional activity, Bilateral upper extremity supported, Reliant on assistive device for balance Standing balance-Leahy Scale: Poor    Special needs/care consideration {Special Care Needs/Care Considerations:304600603}   Previous Home Environment (from acute therapy documentation) Living Arrangements: Spouse/significant other  Lives With: Spouse Available Help at Discharge: Family, Available 24 hours/day Type of Home: Independent living facility Home Layout: One level Home Access: Level entry Bathroom Shower/Tub: Health visitor: Handicapped height Bathroom Accessibility: Yes How Accessible: Accessible via walker Home Care Services: Yes Type of Home Care Services: Home PT Additional Comments: Pt reports moved into [facility] in November 2024. Facility provides meals, pt's children provide transportation to grocery store and appointments.  Discharge Living Setting Plans for Discharge Living Setting: Other (Comment) (Independent Living Facilty) Type of Home at Discharge: Independent living facility Care Facility Name at Discharge: [facility] Home Layout: One level Discharge Home Access: Level entry Discharge Bathroom  Shower/Tub: Walk-in shower Discharge Bathroom Toilet: Handicapped height Discharge Bathroom Accessibility: Yes How Accessible: Accessible via walker Does the patient have any problems obtaining your medications?: No  Social/Family/Support Systems Anticipated Caregiver: James [name] (daughter), James [name] (wife), James [name] (son) Anticipated Industrial/product designer Information: Margret [phone]), Cristal Deer [phone]), [name] [phone]) Caregiver Availability: 24/7 Discharge Plan Discussed with Primary Caregiver: Yes Is Caregiver In Agreement with Plan?: Yes Does Caregiver/Family have Issues with Lodging/Transportation while Pt is in Rehab?: No  Goals Patient/Family Goal for Rehab: *** Expected length of stay: *** Pt/Family Agrees to Admission and willing to participate: Yes Program Orientation Provided & Reviewed with Pt/Caregiver Including Roles  & Responsibilities: Yes  Decrease burden of Care through IP rehab admission: NA  Possible need for SNF placement upon discharge: Not anticipated  Patient Condition: {PATIENT'S CONDITION:22832}  Preadmission Screen Completed By:  James [name], 03/19/2024 10:13 AM ______________________________________________________________________   Discussed status with Dr. Marland Kitchen on *** at *** and received approval for admission today.  Admission Coordinator:  James [name], CCC-SLP, time ***/Date ***   Assessment/Plan: Diagnosis: *** Does the need for close, 24 hr/day Medical supervision in concert with the patient's rehab needs make it unreasonable for this patient  to be served in a less intensive setting? {yes_no_potentially:3041433} Co-Morbidities requiring supervision/potential complications: *** Due to {due ZO:1096045}, does the patient require 24 hr/day rehab nursing? {yes_no_potentially:3041433} Does the patient require coordinated care of a physician, rehab nurse, PT, OT, and SLP to address  physical and functional deficits in the context of the above medical diagnosis(es)? {yes_no_potentially:3041433} Addressing deficits in the following areas: {deficits:3041436} Can the patient actively participate in an intensive therapy program of at least 3 hrs of therapy 5 days a week? {yes_no_potentially:3041433} The potential for patient to make measurable gains while on inpatient rehab is {potential:3041437} Anticipated functional outcomes upon discharge from inpatient rehab: {functional outcomes:304600100} PT, {functional outcomes:304600100} OT, {functional outcomes:304600100} SLP Estimated rehab length of stay to reach the above functional goals is: *** Anticipated discharge destination: {anticipated dc setting:21604} 10. Overall Rehab/Functional Prognosis: {potential:3041437}   MD Signature: ***

## 2024-03-19 NOTE — Assessment & Plan Note (Signed)
 Longstanding history of intractable neuropathic pain of the feet

## 2024-03-19 NOTE — Progress Notes (Signed)
 Inpatient Rehab Admissions:  Inpatient Rehab Consult received.  I spoke with pt's daughter Hermelinda Dellen on the telephone for rehabilitation assessment and to discuss goals and expectations of an inpatient rehab admission.  Discussed average length of stay, insurance authorization requirement and discharge home after completion of CIR. She acknowledged understanding. She is supportive of pt pursuing CIR. She confirmed pt's wife and family will be able to provide support for pt after discharge. Will continue to follow.    Signed: Wolfgang Phoenix, MS, CCC-SLP Admissions Coordinator [phone]

## 2024-03-19 NOTE — Assessment & Plan Note (Signed)
 Continue home regimen of Proscar and Flomax No evidence of lower urinary tract symptoms or obstruction at this time

## 2024-03-19 NOTE — Progress Notes (Signed)
 Rapid Response Event Note   Reason for Call : pt drowsy   Initial Focused Assessment: Pt does arouse with stimulation and becomes very agitated and wants to fight.  Pt will follow commands and can answer some questions.  Pupils are brisk equal and reactive, pt is fighting and saying "stop leave me alone." Pt denies pain.   Interventions: VSS in flwsheet.  MD has been made aware per primary RN.     Plan of Care: MD aware pt will remain current location and continue to monitor.   Conley Rolls, RN

## 2024-03-19 NOTE — Assessment & Plan Note (Signed)
 Continue home regimen of Xanax nightly

## 2024-03-19 NOTE — Progress Notes (Signed)
 PROGRESS NOTE   James [name].  WUJ:[id number] DOB: 1934/04/22 DOA: 03/17/2024 PCP: Lorenda Ishihara, MD   Date of Service: the patient was seen and examined on 03/19/2024  Brief Narrative:  89 year old male with past medical history of anxiety disorder, peripheral artery disease, and descending aortic aneurysm (3.5cm 08/2023), BPH and hospitalization at [facility] 08/2023 for left-sided weakness acute right-sided multifocal stroke who presents to [facility] emergency department with complaints of new onset weakness of the right side.  Patient explains that at approximately 11 AM yesterday morning while the patient was resting in his home he suddenly began to experience tingling and numbness of the right upper and right lower extremity.  At approximately the same time patient also noticed that he was experiencing some weakness of the right arm and right leg as well.  Patient denies any associated visual changes, slurring of his speech, changes in his gait, headaches or confusion along with the symptoms.  Patient explains that his symptoms continued to persist throughout the day and overnight.  The patient reports that he did not seek medical attention the first day of symptoms due to the fact that he thought that they would quickly dissipate on their own.  By the following morning the patient's symptoms continued to persist which prompted the patient to present to [facility] emergency department for evaluation.  Upon evaluation in the emergency department an MRI of the brain was performed without contrast revealing 2 subcentimeter acute infarcts 1 within the anterior right frontal lobe periventricular white matter and the other within the left periatrial white matter.  Due to concerns for recurrent stroke the hospitalist group was then called to assess the patient for admission to the hospital.   Assessment & Plan Acute stroke due to ischemia  [facility]) Echocardiogram does not reveal a cardioembolic source  Review of telemetry overnight reveals no evidence of atrial fibrillation  CT angiogram of the head neck reveals no evidence of large vessel occlusion.   Continue to perform serial neurologic checks Monitoring patient on telemetry Continue Plavix as mentioned above LDL is less than 70, will continue statin therapy with atorvastatin 40 mg daily Speech therapy evaluation complete, recommended dysphagia 3 diet with thin liquids for degree of dysphagia PT evaluation complete, patient is actively being evaluated for admission to CIR and is awaiting insurance authorization.  Toxic metabolic encephalopathy Patient exhibited significant confusion that started yesterday evening after patient was administered both his usual home regimen of Xanax and a new regimen of low-dose Lyrica for his neuropathy. I suspect the Lyrica contributed to the patient's significant confusion.  This is being held. Will change Xanax to as needed and cut dose in half Monitor for return of mentation to baseline BPH without urinary obstruction or lower urinary tract symptoms Continue home regimen of Proscar and Flomax No evidence of lower urinary tract symptoms or obstruction at this time Generalized anxiety disorder Continue home regimen of Xanax nightly Mixed hyperlipidemia Continue atorvastatin 40 mg daily  Peripheral neuropathy Longstanding history of intractable neuropathic pain of the feet      Subjective:  Patient denies any complaints currently.  According to nursing patient was significantly agitated throughout the evening shortly after receiving a combination of both Lyrica and Xanax.  Physical Exam:  Vitals:   03/18/24 1151 03/18/24 2033 03/18/24 2339 03/19/24 0405  BP: 97/72 135/83 115/64 123/69  Pulse: 99 94 86 86  Resp: 16 16 18 16   Temp: 98.2 F (36.8 C) 98.2  F (36.8 C) 98 F (36.7 C) 97.9 F (36.6 C)  TempSrc:  Oral Oral Oral   SpO2: 96% 97% 96% 98%  Weight:      Height:        Constitutional: Lethargic but arousable, oriented x 2,, no associated distress.   Skin: no rashes, no lesions, good skin turgor noted. Eyes: Pupils are equally reactive to light.  No evidence of scleral icterus or conjunctival pallor.  ENMT: Moist mucous membranes noted.  Posterior pharynx clear of any exudate or lesions.   Respiratory: clear to auscultation bilaterally, no wheezing, no crackles. Normal respiratory effort. No accessory muscle use.  Cardiovascular: Regular rate and rhythm, no murmurs / rubs / gallops. No extremity edema. 2+ pedal pulses. No carotid bruits.  Abdomen: Abdomen is soft and nontender.  No evidence of intra-abdominal masses.  Positive bowel sounds noted in all quadrants.   Musculoskeletal: No joint deformity upper and lower extremities. Good ROM, no contractures. Normal muscle tone.  Neuro:   Discernible weakness of the right upper extremity in the proximal and distal muscle groups.  Neurologic exam mostly unchanged.  Notable reduced sensation of the bilateral lower extremities in a stocking-like distribution.  Otherwise, patient is following all commands and moving all 4 extremity spontaneously.   Data Reviewed:  I have personally reviewed and interpreted labs, imaging.  Significant findings are   CBC: Recent Labs  Lab 03/17/24 1225 03/18/24 0526  WBC 5.9 6.5  NEUTROABS 3.8 4.0  HGB 12.8* 12.5*  HCT 40.7 39.6  MCV 94.2 93.6  PLT 164 145*   Basic Metabolic Panel: Recent Labs  Lab 03/17/24 1225 03/18/24 0526  NA 138 137  K 3.9 3.7  CL 106 106  CO2 26 24  GLUCOSE 123* 90  BUN 26* 22  CREATININE 1.19 1.00  CALCIUM 9.0 8.9  MG  --  2.1   GFR: Estimated Creatinine Clearance: 49.4 mL/min (by C-G formula based on SCr of 1 mg/dL). Liver Function Tests: Recent Labs  Lab 03/17/24 1225 03/18/24 0526  AST 21 20  ALT 17 17  ALKPHOS 54 49  BILITOT 0.6 0.8  PROT 7.4 7.0  ALBUMIN 3.6 3.5     Coagulation Profile: No results for input(s): "INR", "PROTIME" in the last 168 hours.   Telemetry: Personally reviewed.  Rhythm is Normal sinus rhythm    Code Status:  DNR.  Code status decision has been confirmed with: patient Family Communication: Son was updated at the bedside and plan of care on 4/4.   Severity of Illness:  The appropriate patient status for this patient is INPATIENT. Inpatient status is judged to be reasonable and necessary in order to provide the required intensity of service to ensure the patient's safety. The patient's presenting symptoms, physical exam findings, and initial radiographic and laboratory data in the context of their chronic comorbidities is felt to place them at high risk for further clinical deterioration. Furthermore, it is not anticipated that the patient will be medically stable for discharge from the hospital within 2 midnights of admission.   * I certify that at the point of admission it is my clinical judgment that the patient will require inpatient hospital care spanning beyond 2 midnights from the point of admission due to high intensity of service, high risk for further deterioration and high frequency of surveillance required.*  Time spent:  38 minutes  Author:  Marinda Elk MD  03/19/2024 8:59 AM

## 2024-03-20 DIAGNOSIS — E782 Mixed hyperlipidemia: Secondary | ICD-10-CM | POA: Diagnosis not present

## 2024-03-20 DIAGNOSIS — N400 Enlarged prostate without lower urinary tract symptoms: Secondary | ICD-10-CM | POA: Diagnosis not present

## 2024-03-20 DIAGNOSIS — G4700 Insomnia, unspecified: Secondary | ICD-10-CM | POA: Insufficient documentation

## 2024-03-20 DIAGNOSIS — I639 Cerebral infarction, unspecified: Secondary | ICD-10-CM | POA: Diagnosis not present

## 2024-03-20 DIAGNOSIS — F411 Generalized anxiety disorder: Secondary | ICD-10-CM | POA: Diagnosis not present

## 2024-03-20 LAB — COMPREHENSIVE METABOLIC PANEL WITH GFR
ALT: 16 U/L (ref 0–44)
AST: 19 U/L (ref 15–41)
Albumin: 3.5 g/dL (ref 3.5–5.0)
Alkaline Phosphatase: 50 U/L (ref 38–126)
Anion gap: 7 (ref 5–15)
BUN: 28 mg/dL — ABNORMAL HIGH (ref 8–23)
CO2: 23 mmol/L (ref 22–32)
Calcium: 8.8 mg/dL — ABNORMAL LOW (ref 8.9–10.3)
Chloride: 107 mmol/L (ref 98–111)
Creatinine, Ser: 1.26 mg/dL — ABNORMAL HIGH (ref 0.61–1.24)
GFR, Estimated: 55 mL/min — ABNORMAL LOW (ref >60)
Glucose, Bld: 100 mg/dL — ABNORMAL HIGH (ref 70–99)
Potassium: 3.8 mmol/L (ref 3.5–5.1)
Sodium: 137 mmol/L (ref 135–145)
Total Bilirubin: 1 mg/dL (ref 0.0–1.2)
Total Protein: 7 g/dL (ref 6.5–8.1)

## 2024-03-20 LAB — MAGNESIUM: Magnesium: 2.2 mg/dL (ref 1.7–2.4)

## 2024-03-20 MED ORDER — GABAPENTIN 100 MG PO CAPS
100.0000 mg | ORAL_CAPSULE | Freq: Two times a day (BID) | ORAL | Status: DC
  Administered 2024-03-20 – 2024-03-21 (×2): 100 mg via ORAL
  Filled 2024-03-20 (×2): qty 1

## 2024-03-20 MED ORDER — ALPRAZOLAM 0.5 MG PO TABS
1.0000 mg | ORAL_TABLET | Freq: Every evening | ORAL | Status: DC | PRN
  Administered 2024-03-20: 1 mg via ORAL
  Filled 2024-03-20: qty 2

## 2024-03-20 MED ORDER — POLYVINYL ALCOHOL 1.4 % OP SOLN: 2.0000 [drp] | OPHTHALMIC | Status: DC | PRN

## 2024-03-20 NOTE — Assessment & Plan Note (Addendum)
 Continue atorvastatin 40 mg daily.

## 2024-03-20 NOTE — Assessment & Plan Note (Signed)
 Longstanding known history Continuing Xanax, dosing as noted above.

## 2024-03-20 NOTE — Assessment & Plan Note (Addendum)
 Longstanding history of intractable neuropathic pain of the feet Attempted trial of Lyrica during this hospitalization which resulted in confusion and therefore this was discontinued Will trial low-dose gabapentin 100 mg twice daily this evening, monitor for side effects.

## 2024-03-20 NOTE — Assessment & Plan Note (Addendum)
 Continue home regimen of Proscar and Flomax No evidence of lower urinary tract symptoms or obstruction at this time

## 2024-03-20 NOTE — Assessment & Plan Note (Addendum)
 Echocardiogram does not reveal a cardioembolic source  Review of telemetry overnight once again reveals no evidence of atrial fibrillation  CT angiogram of the head neck reveals no evidence of large vessel occlusion.   discontinuing serial neurologic checks Monitoring patient on telemetry Continue Plavix monotherapy as mentioned above as recommended by Dr. Armanda Magic with Neurology LDL is less than 70, will continue statin therapy with atorvastatin 40 mg daily as recommended by neurology Speech therapy evaluation complete, recommended dysphagia 3 diet with thin liquids for degree of dysphagia PT evaluation complete, patient is actively being evaluated for admission to CIR and is awaiting insurance authorization.

## 2024-03-20 NOTE — Assessment & Plan Note (Addendum)
 Xanax dosing as noted above.

## 2024-03-20 NOTE — Assessment & Plan Note (Addendum)
 Resolved, patient's mentation is now at baseline. Thought to be secondary to a combination of Lyrica and patient's usual regimen of Xanax for sleep  Holding off on any additional Lyrica at this time.  Will place patient back on his usual regimen of full dose Xanax this evening. Monitor for return of mentation to baseline

## 2024-03-20 NOTE — Progress Notes (Signed)
 OT Cancellation Note  Patient Details Name: James [name]. MRN: [id number] DOB: [birth year]   Cancelled Treatment:    Reason Eval/Treat Not Completed: Fatigue/lethargy limiting ability to participate. Requests rest. Will continue to follow.   Evern Bio 03/20/2024, 3:40 PM Berna Spare, OTR/L Acute Rehabilitation Services Office: [phone]

## 2024-03-20 NOTE — Plan of Care (Signed)
 Problem: Education: Goal: Knowledge of General Education information will improve Description: Including pain rating scale, medication(s)/side effects and non-pharmacologic comfort measures 03/20/2024 1602 by Lajoyce Corners, LPN Outcome: Progressing 03/20/2024 0726 by Lajoyce Corners, LPN Outcome: Progressing   Problem: Health Behavior/Discharge Planning: Goal: Ability to manage health-related needs will improve 03/20/2024 1602 by Lajoyce Corners, LPN Outcome: Progressing 03/20/2024 0726 by Lajoyce Corners, LPN Outcome: Progressing   Problem: Clinical Measurements: Goal: Ability to maintain clinical measurements within normal limits will improve 03/20/2024 1602 by Lajoyce Corners, LPN Outcome: Progressing 03/20/2024 0726 by Lajoyce Corners, LPN Outcome: Progressing Goal: Will remain free from infection 03/20/2024 1602 by Lajoyce Corners, LPN Outcome: Progressing 03/20/2024 0726 by Lajoyce Corners, LPN Outcome: Progressing Goal: Diagnostic test results will improve 03/20/2024 1602 by Lajoyce Corners, LPN Outcome: Progressing 03/20/2024 0726 by Lajoyce Corners, LPN Outcome: Progressing Goal: Respiratory complications will improve 03/20/2024 1602 by Lajoyce Corners, LPN Outcome: Progressing 03/20/2024 0726 by Lajoyce Corners, LPN Outcome: Progressing Goal: Cardiovascular complication will be avoided 03/20/2024 1602 by Lajoyce Corners, LPN Outcome: Progressing 03/20/2024 0726 by Lajoyce Corners, LPN Outcome: Progressing   Problem: Activity: Goal: Risk for activity intolerance will decrease 03/20/2024 1602 by Lajoyce Corners, LPN Outcome: Progressing 03/20/2024 0726 by Lajoyce Corners, LPN Outcome: Progressing   Problem: Nutrition: Goal: Adequate nutrition will be maintained 03/20/2024 1602 by Lajoyce Corners, LPN Outcome: Progressing 03/20/2024 0726 by Lajoyce Corners, LPN Outcome: Progressing   Problem: Coping: Goal: Level of anxiety will decrease 03/20/2024 1602 by Lajoyce Corners, LPN Outcome:  Progressing 03/20/2024 0726 by Lajoyce Corners, LPN Outcome: Progressing   Problem: Elimination: Goal: Will not experience complications related to bowel motility 03/20/2024 1602 by Lajoyce Corners, LPN Outcome: Progressing 03/20/2024 0726 by Lajoyce Corners, LPN Outcome: Progressing Goal: Will not experience complications related to urinary retention 03/20/2024 1602 by Lajoyce Corners, LPN Outcome: Progressing 03/20/2024 0726 by Lajoyce Corners, LPN Outcome: Progressing   Problem: Pain Managment: Goal: General experience of comfort will improve and/or be controlled 03/20/2024 1602 by Lajoyce Corners, LPN Outcome: Progressing 03/20/2024 0726 by Lajoyce Corners, LPN Outcome: Progressing   Problem: Safety: Goal: Ability to remain free from injury will improve 03/20/2024 1602 by Lajoyce Corners, LPN Outcome: Progressing 03/20/2024 0726 by Lajoyce Corners, LPN Outcome: Progressing   Problem: Skin Integrity: Goal: Risk for impaired skin integrity will decrease 03/20/2024 1602 by Lajoyce Corners, LPN Outcome: Progressing 03/20/2024 0726 by Lajoyce Corners, LPN Outcome: Progressing   Problem: Education: Goal: Knowledge of disease or condition will improve 03/20/2024 1602 by Lajoyce Corners, LPN Outcome: Progressing 03/20/2024 0726 by Lajoyce Corners, LPN Outcome: Progressing Goal: Knowledge of secondary prevention will improve (MUST DOCUMENT ALL) 03/20/2024 1602 by Lajoyce Corners, LPN Outcome: Progressing 03/20/2024 0726 by Lajoyce Corners, LPN Outcome: Progressing Goal: Knowledge of patient specific risk factors will improve (DELETE if not current risk factor) 03/20/2024 1602 by Lajoyce Corners, LPN Outcome: Progressing 03/20/2024 0726 by Lajoyce Corners, LPN Outcome: Progressing   Problem: Ischemic Stroke/TIA Tissue Perfusion: Goal: Complications of ischemic stroke/TIA will be minimized 03/20/2024 1602 by Lajoyce Corners, LPN Outcome: Progressing 03/20/2024 0726 by Lajoyce Corners, LPN Outcome: Progressing    Problem: Coping: Goal: Will verbalize positive feelings about self 03/20/2024 1602 by Lajoyce Corners, LPN Outcome: Progressing 03/20/2024 0726 by Lajoyce Corners, LPN Outcome: Progressing Goal: Will identify appropriate support needs 03/20/2024  1610 by Lajoyce Corners, LPN Outcome: Progressing 03/20/2024 0726 by Lajoyce Corners, LPN Outcome: Progressing   Problem: Health Behavior/Discharge Planning: Goal: Ability to manage health-related needs will improve 03/20/2024 1602 by Lajoyce Corners, LPN Outcome: Progressing 03/20/2024 0726 by Lajoyce Corners, LPN Outcome: Progressing Goal: Goals will be collaboratively established with patient/family 03/20/2024 1602 by Lajoyce Corners, LPN Outcome: Progressing 03/20/2024 0726 by Lajoyce Corners, LPN Outcome: Progressing   Problem: Self-Care: Goal: Ability to participate in self-care as condition permits will improve 03/20/2024 1602 by Lajoyce Corners, LPN Outcome: Progressing 03/20/2024 0726 by Lajoyce Corners, LPN Outcome: Progressing Goal: Verbalization of feelings and concerns over difficulty with self-care will improve 03/20/2024 1602 by Lajoyce Corners, LPN Outcome: Progressing 03/20/2024 0726 by Lajoyce Corners, LPN Outcome: Progressing Goal: Ability to communicate needs accurately will improve 03/20/2024 1602 by Lajoyce Corners, LPN Outcome: Progressing 03/20/2024 0726 by Lajoyce Corners, LPN Outcome: Progressing   Problem: Nutrition: Goal: Risk of aspiration will decrease 03/20/2024 1602 by Lajoyce Corners, LPN Outcome: Progressing 03/20/2024 0726 by Lajoyce Corners, LPN Outcome: Progressing Goal: Dietary intake will improve 03/20/2024 1602 by Lajoyce Corners, LPN Outcome: Progressing 03/20/2024 0726 by Lajoyce Corners, LPN Outcome: Progressing   Problem: Education: Goal: Knowledge of disease or condition will improve Outcome: Progressing Goal: Knowledge of secondary prevention will improve (MUST DOCUMENT ALL) Outcome: Progressing Goal: Knowledge of patient  specific risk factors will improve (DELETE if not current risk factor) Outcome: Progressing   Problem: Ischemic Stroke/TIA Tissue Perfusion: Goal: Complications of ischemic stroke/TIA will be minimized Outcome: Progressing   Problem: Coping: Goal: Will verbalize positive feelings about self Outcome: Progressing Goal: Will identify appropriate support needs Outcome: Progressing   Problem: Health Behavior/Discharge Planning: Goal: Ability to manage health-related needs will improve Outcome: Progressing Goal: Goals will be collaboratively established with patient/family Outcome: Progressing   Problem: Self-Care: Goal: Ability to participate in self-care as condition permits will improve Outcome: Progressing Goal: Verbalization of feelings and concerns over difficulty with self-care will improve Outcome: Progressing Goal: Ability to communicate needs accurately will improve Outcome: Progressing   Problem: Nutrition: Goal: Risk of aspiration will decrease Outcome: Progressing Goal: Dietary intake will improve Outcome: Progressing

## 2024-03-20 NOTE — Progress Notes (Signed)
Inpatient Rehab Admissions Coordinator:  ?Insurance authorization started. Will continue to follow. ? ? ?Adela Esteban Graves Madden, MS, CCC-SLP ?Admissions Coordinator ?[phone] ? ?

## 2024-03-20 NOTE — Progress Notes (Signed)
 PT Cancellation Note  Patient Details Name: James [name]. MRN: [id number] DOB: April 04, 1934   Cancelled Treatment:     PT attempted this date but deferred at request of pt 2* c/o fatigue and min sleep last night, "I just want to eat my lunch and try to sleep".  Pt encouraged to move up to chair for lunch but continues to decline.  Repositioned in bed for comfort.  Will follow.   Lillyahna Hemberger 03/20/2024, 1:55 PM

## 2024-03-20 NOTE — Plan of Care (Signed)

## 2024-03-20 NOTE — Progress Notes (Signed)
 PROGRESS NOTE   [name]  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  Upon evaluation in the emergency department an MRI of the brain was performed without contrast revealing 2 subcentimeter acute infarcts 1 within the anterior right frontal lobe periventricular white matter and the other within the left periatrial white matter.  Due to concerns for recurrent stroke the hospitalist group was then called to assess the patient for admission to the hospital.  Case was discussed with Dr. Armanda Magic with Neurology on admission who recommended hospitalization at [facility] for continued stroke workup considering patient was well out of the tPA window.  CT angiogram of the head neck was performed which did not reveal any large vessel occlusion.  echocardiogram was performed not which revealed no evidence of cardioembolic source.  Lipid panel revealed LDL was at target.  Upon further review with Dr. Armanda Magic while it seemed that patient was continuing to experience recurrent cardioembolic strokes likely from atrial fibrillation, due to the fact that this has not been confirmed on telemetry full-dose anticoagulation should not be initiated at this time.  Dr. Armanda Magic recommended maintaining current doses of statin therapy, Plavix monotherapy and Zio patch once again at discharge with outpatient cardiology and neurology follow-up.  Patient was additionally evaluated by physical therapy and it was felt that the patient  would be a good candidate for inpatient rehabilitation.  Patient was evaluated by CIR and accepted, insurance authorization pending.   Assessment & Plan Acute stroke due to ischemia [facility]) Echocardiogram does not reveal a cardioembolic source  Review of telemetry overnight once again reveals no evidence of atrial fibrillation  CT angiogram of the head neck reveals no evidence of large vessel occlusion.   discontinuing serial neurologic checks Monitoring patient on telemetry Continue Plavix monotherapy as mentioned above as recommended by Dr. Armanda Magic with Neurology LDL is less than 70, will continue statin therapy with atorvastatin 40 mg daily as recommended by neurology Speech therapy evaluation complete, recommended dysphagia 3 diet with thin liquids for degree of dysphagia PT evaluation complete, patient is actively being evaluated for admission to CIR and is awaiting insurance authorization.  Toxic metabolic encephalopathy Resolved, patient's mentation is now at baseline. Thought to be secondary to a combination of Lyrica and patient's usual regimen of Xanax for sleep  Holding off on any additional Lyrica at this time.  Will place patient back on his usual regimen of full dose Xanax this evening. Monitor for return of mentation to baseline BPH without urinary obstruction or lower urinary tract symptoms Continue home regimen of Proscar and Flomax No evidence of lower urinary tract symptoms or obstruction at this time Generalized anxiety disorder Xanax dosing as noted above. Mixed hyperlipidemia Continue atorvastatin 40 mg daily  Peripheral neuropathy Longstanding history of intractable neuropathic pain of the feet Attempted trial of Lyrica during this hospitalization which resulted in confusion and therefore this was discontinued Will trial low-dose gabapentin 100 mg twice daily this evening, monitor for side effects.  Insomnia Longstanding known history Continuing Xanax, dosing as  noted above.     Subjective:  Patient continues to complain of bilateral foot and leg pain that he describes as burning and tingling.  The symptoms have been longstanding without any alleviating factors.  Physical Exam:  Vitals:   03/20/24 0814 03/20/24 1151 03/20/24 1646 03/20/24 2010  BP: 127/79 114/69 128/69 117/78  Pulse: (!) 50 76 95 85  Resp:    17  Temp: (!) 97.5 F (36.4 C) 98.1 F (36.7 C) 98.5 F (36.9 C) 98.7 F (37.1 C)  TempSrc: Oral Oral    SpO2: 96% 97% 97% 97%  Weight:      Height:        Constitutional: Awake and alert, oriented x 3, no associated distress.   Skin: no rashes, no lesions, good skin turgor noted. Eyes: Pupils are equally reactive to light.  No evidence of scleral icterus or conjunctival pallor.  ENMT: Moist mucous membranes noted.  Posterior pharynx clear of any exudate or lesions.   Respiratory: clear to auscultation bilaterally, no wheezing, no crackles. Normal respiratory effort. No accessory muscle use.  Cardiovascular: Regular rate and rhythm, no murmurs / rubs / gallops. No extremity edema. 2+ pedal pulses. No carotid bruits.  Abdomen: Abdomen is soft and nontender.  No evidence of intra-abdominal masses.  Positive bowel sounds noted in all quadrants.   Musculoskeletal: No joint deformity upper and lower extremities. Good ROM, no contractures. Normal muscle tone.  Neuro:   Discernible weakness of the right upper extremity in the proximal and distal muscle groups.  Very mild discernible weakness of the right lower extremity as well in the proximal muscle groups.  Neurologic exam otherwise unchanged.  Notable reduced sensation of the bilateral lower extremities in a stocking-like distribution.  Otherwise, patient is following all commands and moving all 4 extremity spontaneously.   Data Reviewed:  I have personally reviewed and interpreted labs, imaging.  Significant findings are   CBC: Recent Labs  Lab 03/17/24 1225 03/18/24 0526  03/19/24 0906  WBC 5.9 6.5 5.7  NEUTROABS 3.8 4.0 3.2  HGB 12.8* 12.5* 13.5  HCT 40.7 39.6 41.4  MCV 94.2 93.6 93.5  PLT 164 145* 145*   Basic Metabolic Panel: Recent Labs  Lab 03/17/24 1225 03/18/24 0526 03/19/24 0906 03/20/24 0531  NA 138 137 136 137  K 3.9 3.7 3.5 3.8  CL 106 106 103 107  CO2 26 24 24 23   GLUCOSE 123* 90 96 100*  BUN 26* 22 27* 28*  CREATININE 1.19 1.00 1.22 1.26*  CALCIUM 9.0 8.9 9.0 8.8*  MG  --  2.1 2.3 2.2   GFR: Estimated Creatinine Clearance: 39.2 mL/min (A) (by C-G formula based on SCr of 1.26 mg/dL (H)). Liver Function Tests: Recent Labs  Lab 03/17/24 1225 03/18/24 0526 03/19/24 0906 03/20/24 0531  AST 21 20 22 19   ALT 17 17 15 16   ALKPHOS 54 49 53 50  BILITOT 0.6 0.8 0.8 1.0  PROT 7.4 7.0 7.4 7.0  ALBUMIN 3.6 3.5 3.7 3.5    Coagulation Profile: No results for input(s): "INR", "PROTIME" in the last 168 hours.   Telemetry: Personally reviewed.  Rhythm is Normal sinus rhythm    Code Status:  DNR.  Code status decision has been confirmed with: patient Family Communication: Son was updated at the bedside and plan of care on 4/4.   Severity of Illness:  The appropriate patient status for this patient is INPATIENT. Inpatient status is judged to be reasonable and necessary in order to provide the required intensity of service to ensure the patient's  safety. The patient's presenting symptoms, physical exam findings, and initial radiographic and laboratory data in the context of their chronic comorbidities is felt to place them at high risk for further clinical deterioration. Furthermore, it is not anticipated that the patient will be medically stable for discharge from the hospital within 2 midnights of admission.   * I certify that at the point of admission it is my clinical judgment that the patient will require inpatient hospital care spanning beyond 2 midnights from the point of admission due to high intensity of service, high risk for  further deterioration and high frequency of surveillance required.*  Time spent:  40 minutes  Author:  Marinda Elk MD  03/20/2024 8:56 PM

## 2024-03-21 ENCOUNTER — Other Ambulatory Visit: Payer: Self-pay

## 2024-03-21 ENCOUNTER — Inpatient Hospital Stay (HOSPITAL_COMMUNITY)
Admission: AD | Admit: 2024-03-21 | Discharge: 2024-03-26 | DRG: 057 | Disposition: A | Discharge status: Home / Self Care | Source: Other Acute Inpatient Hospital | Attending: Physical Medicine and Rehabilitation | Admitting: Physical Medicine and Rehabilitation

## 2024-03-21 DIAGNOSIS — R001 Bradycardia, unspecified: Secondary | ICD-10-CM | POA: Diagnosis not present

## 2024-03-21 DIAGNOSIS — G4700 Insomnia, unspecified: Secondary | ICD-10-CM | POA: Diagnosis present

## 2024-03-21 DIAGNOSIS — E785 Hyperlipidemia, unspecified: Secondary | ICD-10-CM | POA: Diagnosis present

## 2024-03-21 DIAGNOSIS — Z7902 Long term (current) use of antithrombotics/antiplatelets: Secondary | ICD-10-CM | POA: Diagnosis not present

## 2024-03-21 DIAGNOSIS — K5901 Slow transit constipation: Secondary | ICD-10-CM | POA: Diagnosis not present

## 2024-03-21 DIAGNOSIS — K5900 Constipation, unspecified: Secondary | ICD-10-CM | POA: Diagnosis present

## 2024-03-21 DIAGNOSIS — I69391 Dysphagia following cerebral infarction: Secondary | ICD-10-CM

## 2024-03-21 DIAGNOSIS — R1310 Dysphagia, unspecified: Secondary | ICD-10-CM | POA: Diagnosis present

## 2024-03-21 DIAGNOSIS — I719 Aortic aneurysm of unspecified site, without rupture: Secondary | ICD-10-CM | POA: Diagnosis present

## 2024-03-21 DIAGNOSIS — Z7409 Other reduced mobility: Secondary | ICD-10-CM | POA: Diagnosis present

## 2024-03-21 DIAGNOSIS — Z87898 Personal history of other specified conditions: Secondary | ICD-10-CM

## 2024-03-21 DIAGNOSIS — Z87891 Personal history of nicotine dependence: Secondary | ICD-10-CM | POA: Diagnosis not present

## 2024-03-21 DIAGNOSIS — I69354 Hemiplegia and hemiparesis following cerebral infarction affecting left non-dominant side: Secondary | ICD-10-CM | POA: Diagnosis present

## 2024-03-21 DIAGNOSIS — Z7982 Long term (current) use of aspirin: Secondary | ICD-10-CM | POA: Diagnosis not present

## 2024-03-21 DIAGNOSIS — N400 Enlarged prostate without lower urinary tract symptoms: Secondary | ICD-10-CM | POA: Diagnosis present

## 2024-03-21 DIAGNOSIS — I739 Peripheral vascular disease, unspecified: Secondary | ICD-10-CM | POA: Diagnosis present

## 2024-03-21 DIAGNOSIS — I959 Hypotension, unspecified: Secondary | ICD-10-CM | POA: Diagnosis not present

## 2024-03-21 DIAGNOSIS — I639 Cerebral infarction, unspecified: Principal | ICD-10-CM | POA: Diagnosis present

## 2024-03-21 DIAGNOSIS — Z79899 Other long term (current) drug therapy: Secondary | ICD-10-CM | POA: Diagnosis not present

## 2024-03-21 LAB — RENAL FUNCTION PANEL
Albumin: 3.3 g/dL — ABNORMAL LOW (ref 3.5–5.0)
Anion gap: 7 (ref 5–15)
BUN: 27 mg/dL — ABNORMAL HIGH (ref 8–23)
CO2: 24 mmol/L (ref 22–32)
Calcium: 9 mg/dL (ref 8.9–10.3)
Chloride: 108 mmol/L (ref 98–111)
Creatinine, Ser: 1.15 mg/dL (ref 0.61–1.24)
GFR, Estimated: >60 mL/min (ref >60)
Glucose, Bld: 93 mg/dL (ref 70–99)
Phosphorus: 2.8 mg/dL (ref 2.5–4.6)
Potassium: 3.6 mmol/L (ref 3.5–5.1)
Sodium: 139 mmol/L (ref 135–145)

## 2024-03-21 LAB — CBC
HCT: 38.7 % — ABNORMAL LOW (ref 39.0–52.0)
Hemoglobin: 12.6 g/dL — ABNORMAL LOW (ref 13.0–17.0)
MCH: 29.8 pg (ref 26.0–34.0)
MCHC: 32.6 g/dL (ref 30.0–36.0)
MCV: 91.5 fL (ref 80.0–100.0)
Platelets: 145 10*3/uL — ABNORMAL LOW (ref 150–400)
RBC: 4.23 MIL/uL (ref 4.22–5.81)
RDW: 13.6 % (ref 11.5–15.5)
WBC: 5.2 10*3/uL (ref 4.0–10.5)
nRBC: 0 % (ref 0.0–0.2)

## 2024-03-21 LAB — CREATININE, SERUM
Creatinine, Ser: 1.17 mg/dL (ref 0.61–1.24)
GFR, Estimated: 60 mL/min — ABNORMAL LOW (ref >60)

## 2024-03-21 LAB — MAGNESIUM: Magnesium: 2.2 mg/dL (ref 1.7–2.4)

## 2024-03-21 MED ORDER — ONDANSETRON HCL 4 MG PO TABS: 4.0000 mg | ORAL_TABLET | Freq: Four times a day (QID) | ORAL | Status: DC | PRN

## 2024-03-21 MED ORDER — ALPRAZOLAM 0.5 MG PO TABS
1.0000 mg | ORAL_TABLET | Freq: Every evening | ORAL | Status: DC | PRN
  Administered 2024-03-22: 1 mg via ORAL
  Filled 2024-03-21: qty 2

## 2024-03-21 MED ORDER — GABAPENTIN 100 MG PO CAPS
100.0000 mg | ORAL_CAPSULE | Freq: Two times a day (BID) | ORAL | Status: DC
  Administered 2024-03-21 – 2024-03-26 (×10): 100 mg via ORAL
  Filled 2024-03-21 (×10): qty 1

## 2024-03-21 MED ORDER — DOCUSATE SODIUM 100 MG PO CAPS
100.0000 mg | ORAL_CAPSULE | Freq: Every day | ORAL | Status: DC
  Administered 2024-03-22 – 2024-03-26 (×5): 100 mg via ORAL
  Filled 2024-03-21 (×5): qty 1

## 2024-03-21 MED ORDER — ACETAMINOPHEN 650 MG RE SUPP: 650.0000 mg | Freq: Four times a day (QID) | RECTAL | Status: DC | PRN

## 2024-03-21 MED ORDER — ATORVASTATIN CALCIUM 40 MG PO TABS
40.0000 mg | ORAL_TABLET | Freq: Every day | ORAL | Status: DC
  Administered 2024-03-22 – 2024-03-26 (×5): 40 mg via ORAL
  Filled 2024-03-21 (×5): qty 1

## 2024-03-21 MED ORDER — GABAPENTIN 100 MG PO CAPS: 100.0000 mg | ORAL_CAPSULE | Freq: Two times a day (BID) | ORAL | Status: DC

## 2024-03-21 MED ORDER — CLOPIDOGREL BISULFATE 75 MG PO TABS
75.0000 mg | ORAL_TABLET | Freq: Every day | ORAL | Status: DC
  Administered 2024-03-22 – 2024-03-26 (×5): 75 mg via ORAL
  Filled 2024-03-21 (×5): qty 1

## 2024-03-21 MED ORDER — ENOXAPARIN SODIUM 40 MG/0.4ML IJ SOSY
40.0000 mg | PREFILLED_SYRINGE | INTRAMUSCULAR | Status: DC
  Administered 2024-03-21 – 2024-03-25 (×5): 40 mg via SUBCUTANEOUS
  Filled 2024-03-21 (×5): qty 0.4

## 2024-03-21 MED ORDER — ATORVASTATIN CALCIUM 40 MG PO TABS: 40.0000 mg | ORAL_TABLET | Freq: Every day | ORAL | Status: DC

## 2024-03-21 MED ORDER — TAMSULOSIN HCL 0.4 MG PO CAPS
0.4000 mg | ORAL_CAPSULE | Freq: Every day | ORAL | Status: DC
  Administered 2024-03-22: 0.4 mg via ORAL
  Filled 2024-03-21: qty 1

## 2024-03-21 MED ORDER — POLYVINYL ALCOHOL 1.4 % OP SOLN
2.0000 [drp] | OPHTHALMIC | Status: DC | PRN
Start: 2024-03-21 — End: 2024-03-26

## 2024-03-21 MED ORDER — ACETAMINOPHEN 325 MG PO TABS
650.0000 mg | ORAL_TABLET | Freq: Four times a day (QID) | ORAL | Status: DC | PRN
  Administered 2024-03-21: 650 mg via ORAL
  Filled 2024-03-21: qty 2

## 2024-03-21 MED ORDER — FINASTERIDE 5 MG PO TABS
5.0000 mg | ORAL_TABLET | Freq: Every day | ORAL | Status: DC
Start: 2024-03-22 — End: 2024-03-26
  Administered 2024-03-22 – 2024-03-26 (×5): 5 mg via ORAL
  Filled 2024-03-21 (×5): qty 1

## 2024-03-21 MED ORDER — POLYETHYLENE GLYCOL 3350 17 G PO PACK
17.0000 g | PACK | Freq: Every day | ORAL | Status: DC | PRN
  Administered 2024-03-22 – 2024-03-23 (×2): 17 g via ORAL
  Filled 2024-03-21 (×2): qty 1

## 2024-03-21 MED ORDER — ONDANSETRON HCL 4 MG/2ML IJ SOLN: 4.0000 mg | Freq: Four times a day (QID) | INTRAMUSCULAR | Status: DC | PRN

## 2024-03-21 MED ORDER — ENOXAPARIN SODIUM 40 MG/0.4ML IJ SOSY: 40.0000 mg | PREFILLED_SYRINGE | INTRAMUSCULAR | Status: DC

## 2024-03-21 NOTE — Progress Notes (Signed)
 Inpatient Rehabilitation Admission Medication Review by a Pharmacist  A complete drug regimen review was completed for this patient to identify any potential clinically significant medication issues.  High Risk Drug Classes Is patient taking? Indication by Medication  Antipsychotic No   Anticoagulant Yes Lovenox- vte ppx  Antibiotic No   Opioid No   Antiplatelet Yes Plavix- cva ppx  Hypoglycemics/insulin No   Vasoactive Medication Yes Flomax- BPH  Chemotherapy No   Other Yes Xanax- GAD Lipitor- HLD Proscar- BPH Neurontin- neuropathy     Type of Medication Issue Identified Description of Issue Recommendation(s)  Drug Interaction(s) (clinically significant)     Duplicate Therapy     Allergy     No Medication Administration End Date     Incorrect Dose     Additional Drug Therapy Needed     Significant med changes from prior encounter (inform family/care partners about these prior to discharge).    Other       Clinically significant medication issues were identified that warrant physician communication and completion of prescribed/recommended actions by midnight of the next day:  No    Time spent performing this drug regimen review (minutes):  30   Ikeem Cleckler BS, PharmD, BCPS Clinical Pharmacist 03/21/2024 1:03 PM  Contact: [phone] after 3 PM  "Be curious, not judgmental..." -Debbora Dus

## 2024-03-21 NOTE — Discharge Instructions (Signed)
 Inpatient Rehab Discharge Instructions  James [name]. Discharge date and time: No discharge date for patient encounter.   Activities/Precautions/ Functional Status: Activity: As tolerated Diet: Regular Wound Care: Routine skin checks Functional status:  ___ No restrictions     ___ Walk up steps independently ___ 24/7 supervision/assistance   ___ Walk up steps with assistance ___ Intermittent supervision/assistance  ___ Bathe/dress independently ___ Walk with walker     _x__ Bathe/dress with assistance ___ Walk Independently    ___ Shower independently ___ Walk with assistance    ___ Shower with assistance ___ No alcohol     ___ Return to work/school ________  Special Instructions: No driving smoking or alcohol   My questions have been answered and I understand these instructions. I will adhere to these goals and the provided educational materials after my discharge from the hospital.  Patient/Caregiver Signature _______________________________ Date __________  Clinician Signature _______________________________________ Date __________  Please bring this form and your medication list with you to all your follow-up doctor's appointments. STROKE/TIA DISCHARGE INSTRUCTIONS SMOKING Cigarette smoking nearly doubles your risk of having a stroke & is the single most alterable risk factor  If you smoke or have smoked in the last 12 months, you are advised to quit smoking for your health. Most of the excess cardiovascular risk related to smoking disappears within a year of stopping. Ask you doctor about anti-smoking medications [place] Quit Line: 1-800-QUIT NOW Free Smoking Cessation Classes [phone]  CHOLESTEROL Know your levels; limit fat & cholesterol in your diet  Lipid Panel     Component Value Date/Time   CHOL 109 03/18/2024 0526   TRIG 64 03/18/2024 0526   HDL 52 03/18/2024 0526   CHOLHDL 2.1 03/18/2024 0526   VLDL 13 03/18/2024 0526   LDLCALC 44 03/18/2024 0526      Many patients benefit from treatment even if their cholesterol is at goal. Goal: Total Cholesterol (CHOL) less than 160 Goal:  Triglycerides (TRIG) less than 150 Goal:  HDL greater than 40 Goal:  LDL (LDLCALC) less than 100   BLOOD PRESSURE American Stroke Association blood pressure target is less that 120/80 mm/Hg  Your discharge blood pressure is:    Monitor your blood pressure Limit your salt and alcohol intake Many individuals will require more than one medication for high blood pressure  DIABETES (A1c is a blood sugar average for last 3 months) Goal HGBA1c is under 7% (HBGA1c is blood sugar average for last 3 months)  Diabetes: No known diagnosis of diabetes    Lab Results  Component Value Date   HGBA1C 5.7 (H) 03/18/2024    Your HGBA1c can be lowered with medications, healthy diet, and exercise. Check your blood sugar as directed by your physician Call your physician if you experience unexplained or low blood sugars.  PHYSICAL ACTIVITY/REHABILITATION Goal is 30 minutes at least 4 days per week  Activity: Increase activity slowly, Therapies: Physical Therapy: Home Health Return to work:  Activity decreases your risk of heart attack and stroke and makes your heart stronger.  It helps control your weight and blood pressure; helps you relax and can improve your mood. Participate in a regular exercise program. Talk with your doctor about the best form of exercise for you (dancing, walking, swimming, cycling).  DIET/WEIGHT Goal is to maintain a healthy weight  Your discharge diet is:  Diet Order             DIET DYS 3 Room service appropriate? Yes; Fluid consistency:  Thin  Diet effective now                   liquids Your height is:    Your current weight is:   Your Body Mass Index (BMI) is:    Following the type of diet specifically designed for you will help prevent another stroke. Your goal weight range is:   Your goal Body Mass Index (BMI) is 19-24. Healthy  food habits can help reduce 3 risk factors for stroke:  High cholesterol, hypertension, and excess weight.  RESOURCES Stroke/Support Group:  Call [phone]   STROKE EDUCATION PROVIDED/REVIEWED AND GIVEN TO PATIENT Stroke warning signs and symptoms How to activate emergency medical system (call 911). Medications prescribed at discharge. Need for follow-up after discharge. Personal risk factors for stroke. Pneumonia vaccine given: No Flu vaccine given: No My questions have been answered, the writing is legible, and I understand these instructions.  I will adhere to these goals & educational materials that have been provided to me after my discharge from the hospital.

## 2024-03-21 NOTE — Discharge Summary (Signed)
 Physician Discharge Summary   Patient: James [name]. MRN: [id number] DOB: 02-28-1934  Admit date:     03/17/2024  Discharge date: 03/21/24  Discharge Physician: Jonah Blue   PCP: Lorenda Ishihara, MD   Recommendations at discharge:   You are being discharged to inpatient rehabilitation at [facility] Currently recommended for dysphagia 3 diet with thin liquids Take Lipitor 40 mg daily Consult cardiology for Zio patch at [facility] Needs outpatient neurology and cardiology follow up Follow up with Dr. Chales Salmon after d/c from [facility]  Discharge Diagnoses: Principal Problem:   Acute stroke due to ischemia [facility]) Active Problems:   Mixed hyperlipidemia   Peripheral neuropathy   BPH without urinary obstruction or lower urinary tract symptoms   Generalized anxiety disorder   Toxic metabolic encephalopathy   Insomnia    Hospital Course: 89yo with h/o anxiety d/o, PAD with descending aortic aneurysm, BPH, and prior CVA in 08/2023 who presented on 4/3 with R-sided weakness and was found to have acute CVA with 2 infarcts.  Neurology consulted.  CTA and echo unremarkable.  Concern for afib but this is not confirmed; will need Zio patch monitoring at time of dc as well as cardiology and neurology outpatient f/u.  He is accepted to CIR with insurance pending.  Assessment and Plan:  Acute CVA Echocardiogram does not reveal a cardioembolic source  Review of telemetry overnight once again reveals no evidence of atrial fibrillation  CT angiogram of the head neck reveals no evidence of large vessel occlusion Continue Plavix monotherapy as recommended by Dr. Armanda Magic with Neurology LDL is less than 70, will continue statin therapy with atorvastatin 40 mg daily as recommended by neurology Speech therapy evaluation complete, recommended dysphagia 3 diet with thin liquids  PT evaluation complete, patient is actively being evaluated for admission to CIR and is being discharged today Declined loop  recorder Consult cardiology for Zio patch at [facility]   Toxic metabolic encephalopathy Resolved, patient's mentation is now at baseline Thought to be secondary to a combination of Lyrica and patient's usual regimen of Xanax for sleep  Holding off on any additional Lyrica at this time, Xanax resumed   BPH without urinary obstruction or lower urinary tract symptoms Continue home regimen of Proscar and Flomax No evidence of lower urinary tract symptoms or obstruction at this time  Generalized anxiety disorder Xanax continued  Mixed hyperlipidemia Continue atorvastatin 40 mg daily   Peripheral neuropathy Longstanding history of intractable neuropathic pain of the feet Attempted trial of Lyrica during this hospitalization which resulted in confusion and therefore this was discontinued Trialing low-dose gabapentin 100 mg twice daily, monitor for side effects   Insomnia Longstanding known history Continuing Xanax  DNR Confirmed on admission Outpatient DNR form sent    Consultants: Neurology PT OT SLP [facility] team CIR  Procedures: Echocardiogram 4/5  Antibiotics: None  30 Day Unplanned Readmission Risk Score    Flowsheet Row ED to Hosp-Admission (Current) from 03/17/2024 in [facility]  30 Day Unplanned Readmission Risk Score (%) 17.04 Filed at 03/21/2024 0801       This score is the patient's risk of an unplanned readmission within 30 days of being discharged (0 -100%). The score is based on dignosis, age, lab data, medications, orders, and past utilization.   Low:  0-14.9   Medium: 15-21.9   High: 22-29.9   Extreme: 30 and above              Pain control -  [place] Controlled Substance Reporting System database was reviewed. and patient was instructed, not to drive, operate heavy machinery, perform activities at heights, swimming or participation in water activities or provide baby-sitting services while on Pain, Sleep and  Anxiety Medications; until their outpatient Physician has advised to do so again. Also recommended to not to take more than prescribed Pain, Sleep and Anxiety Medications.   Disposition: Rehabilitation facility Diet recommendation:  Dysphagia type 3 Thin Liquid DISCHARGE MEDICATION: Allergies as of 03/21/2024   No Known Allergies      Medication List     STOP taking these medications    aspirin EC 81 MG tablet   DULoxetine 30 MG capsule Commonly known as: CYMBALTA   rosuvastatin 10 MG tablet Commonly known as: CRESTOR   senna-docusate 8.6-50 MG tablet Commonly known as: Senokot-S   traZODone 100 MG tablet Commonly known as: DESYREL       TAKE these medications    acetaminophen 325 MG tablet Commonly known as: TYLENOL Take 2 tablets (650 mg total) by mouth every 6 (six) hours as needed for mild pain (or Fever >/= 101).   ALPRAZolam 1 MG tablet Commonly known as: XANAX Take 1 tablet (1 mg total) by mouth at bedtime.   atorvastatin 40 MG tablet Commonly known as: LIPITOR Take 1 tablet (40 mg total) by mouth daily.   clopidogrel 75 MG tablet Commonly known as: PLAVIX Take 1 tablet (75 mg total) by mouth daily.   docusate sodium 100 MG capsule Commonly known as: COLACE Take 100 mg by mouth in the morning.   finasteride 5 MG tablet Commonly known as: PROSCAR Take 1 tablet (5 mg total) by mouth daily.   gabapentin 100 MG capsule Commonly known as: NEURONTIN Take 1 capsule (100 mg total) by mouth 2 (two) times daily.   polyethylene glycol 17 g packet Commonly known as: MIRALAX / GLYCOLAX Take 17 g by mouth daily. What changed:  when to take this reasons to take this   tamsulosin 0.4 MG Caps capsule Commonly known as: FLOMAX Take 1 capsule (0.4 mg total) by mouth daily.        Discharge Exam:   Subjective: Reports feeling tired, reports this is normal for his age.  Agrees with plan for dc to CIR today.   Objective: Vitals:   03/20/24 2010  03/21/24 0313  BP: 117/78 110/72  Pulse: 85 87  Resp: 17 18  Temp: 98.7 F (37.1 C) 97.7 F (36.5 C)  SpO2: 97% 96%    Intake/Output Summary (Last 24 hours) at 03/21/2024 1126 Last data filed at 03/21/2024 0900 Gross per 24 hour  Intake 480 ml  Output 200 ml  Net 280 ml   Filed Weights   03/17/24 1928  Weight: 73.5 kg    Exam:  General:  Appears calm and comfortable and is in NAD Eyes:   EOMI, normal lids, iris ENT:  grossly normal hearing, lips & tongue, mmm Cardiovascular:  RRR, no m/r/g. No LE edema.  Respiratory:   CTA bilaterally with no wheezes/rales/rhonchi.  Normal respiratory effort. Abdomen:  soft, NT, ND Skin:  no rash or induration seen on limited exam Musculoskeletal:  grossly normal tone BUE/BLE, good ROM, no bony abnormality Psychiatric:  blunted mood and affect, speech fluent and appropriate, AOx3 Neurologic:  L facial droop, moves all extremities in coordinated fashion  Data Reviewed: I have reviewed the patient's lab results since admission.  Pertinent labs for today include:   Stable BMP  Condition at discharge: improving  The results of significant diagnostics from this hospitalization (including imaging, microbiology, ancillary and laboratory) are listed below for reference.   Imaging Studies: ECHOCARDIOGRAM COMPLETE Result Date: 03/18/2024    ECHOCARDIOGRAM REPORT   Patient Name:   Brinson Tozzi. Date of Exam: 03/18/2024 Medical Rec #:  [id number]              Height:       68.0 in Accession #:    [id number]             Weight:       162.0 lb Date of Birth:  [birth year]              BSA:          1.869 m Patient Age:    89 years               BP:           150/78 mmHg Patient Gender: M                      HR:           84 bpm. Exam Location:  Inpatient Procedure: 2D Echo, Cardiac Doppler and Color Doppler (Both Spectral and Color            Flow Doppler were utilized during procedure). Indications:    I10 Hypertension  History:        Patient  has prior history of Echocardiogram examinations, most                 recent 09/03/2023. Stroke.  Sonographer:    Maxwell Marion Referring Phys: [id number] Deno Lunger SHALHOUB IMPRESSIONS  1. Left ventricular ejection fraction, by estimation, is 60 to 65%. The left ventricle has normal function. The left ventricle has no regional wall motion abnormalities. There is mild concentric left ventricular hypertrophy. Left ventricular diastolic parameters are consistent with Grade I diastolic dysfunction (impaired relaxation).  2. Right ventricular systolic function is normal. The right ventricular size is normal.  3. The mitral valve is normal in structure. No evidence of mitral valve regurgitation. No evidence of mitral stenosis.  4. The aortic valve is normal in structure. There is mild calcification of the aortic valve. Aortic valve regurgitation is not visualized. No aortic stenosis is present.  5. The inferior vena cava is normal in size with greater than 50% respiratory variability, suggesting right atrial pressure of 3 mmHg. FINDINGS  Left Ventricle: Left ventricular ejection fraction, by estimation, is 60 to 65%. The left ventricle has normal function. The left ventricle has no regional wall motion abnormalities. Definity contrast agent was given IV to delineate the left ventricular  endocardial borders. The left ventricular internal cavity size was normal in size. There is mild concentric left ventricular hypertrophy. Left ventricular diastolic parameters are consistent with Grade I diastolic dysfunction (impaired relaxation). Right Ventricle: The right ventricular size is normal. No increase in right ventricular wall thickness. Right ventricular systolic function is normal. Left Atrium: Left atrial size was normal in size. Right Atrium: Right atrial size was normal in size. Pericardium: There is no evidence of pericardial effusion. Mitral Valve: The mitral valve is normal in structure. No evidence of mitral valve  regurgitation. No evidence of mitral valve stenosis. Tricuspid Valve: The tricuspid valve is normal in structure. Tricuspid valve regurgitation is trivial. No evidence of tricuspid stenosis. Aortic Valve: The aortic valve is normal in structure. There  is mild calcification of the aortic valve. Aortic valve regurgitation is not visualized. No aortic stenosis is present. Aortic valve mean gradient measures 2.0 mmHg. Aortic valve peak gradient measures 3.4 mmHg. Aortic valve area, by VTI measures 2.56 cm. Pulmonic Valve: The pulmonic valve was not well visualized. Pulmonic valve regurgitation is not visualized. No evidence of pulmonic stenosis. Aorta: The aortic root is normal in size and structure. Venous: The inferior vena cava is normal in size with greater than 50% respiratory variability, suggesting right atrial pressure of 3 mmHg. IAS/Shunts: No atrial level shunt detected by color flow Doppler.  LEFT VENTRICLE PLAX 2D LVOT diam:     1.80 cm     Diastology LV SV:         44          LV e' medial:    4.68 cm/s LV SV Index:   23          LV E/e' medial:  9.0 LVOT Area:     2.54 cm    LV e' lateral:   7.07 cm/s                            LV E/e' lateral: 6.0  LV Volumes (MOD) LV vol d, MOD A2C: 85.1 ml LV vol d, MOD A4C: 98.1 ml LV vol s, MOD A2C: 28.3 ml LV vol s, MOD A4C: 27.5 ml LV SV MOD A2C:     56.8 ml LV SV MOD A4C:     98.1 ml LV SV MOD BP:      67.5 ml RIGHT VENTRICLE RV S prime:     12.80 cm/s TAPSE (M-mode): 2.2 cm LEFT ATRIUM           Index LA Vol (A2C): 24.9 ml 13.32 ml/m LA Vol (A4C): 23.1 ml 12.36 ml/m  AORTIC VALVE AV Area (Vmax):    2.39 cm AV Area (Vmean):   2.51 cm AV Area (VTI):     2.56 cm AV Vmax:           92.70 cm/s AV Vmean:          57.600 cm/s AV VTI:            0.170 m AV Peak Grad:      3.4 mmHg AV Mean Grad:      2.0 mmHg LVOT Vmax:         87.20 cm/s LVOT Vmean:        56.900 cm/s LVOT VTI:          0.171 m LVOT/AV VTI ratio: 1.01 MITRAL VALVE MV Area (PHT): 4.23 cm    SHUNTS  MV E velocity: 42.20 cm/s  Systemic VTI:  0.17 m MV A velocity: 68.40 cm/s  Systemic Diam: 1.80 cm MV E/A ratio:  0.62 Arvilla Meres MD Electronically signed by Arvilla Meres MD Signature Date/Time: 03/18/2024/2:06:14 PM    Final    CT ANGIO HEAD NECK W WO CM Result Date: 03/17/2024 CLINICAL DATA:  Stroke/TIA EXAM: CT ANGIOGRAPHY HEAD AND NECK WITH AND WITHOUT CONTRAST TECHNIQUE: Multidetector CT imaging of the head and neck was performed using the standard protocol during bolus administration of intravenous contrast. Multiplanar CT image reconstructions and MIPs were obtained to evaluate the vascular anatomy. Carotid stenosis measurements (when applicable) are obtained utilizing NASCET criteria, using the distal internal carotid diameter as the denominator. RADIATION DOSE REDUCTION: This exam was performed according to the departmental dose-optimization program which  includes automated exposure control, adjustment of the mA and/or kV according to patient size and/or use of iterative reconstruction technique. CONTRAST:  75mL OMNIPAQUE IOHEXOL 350 MG/ML SOLN COMPARISON:  None Available. FINDINGS: CT HEAD FINDINGS Brain: There is no mass, hemorrhage or extra-axial collection. There is generalized atrophy without lobar predilection. Hypodensity of the white matter is most commonly associated with chronic microvascular disease. Vascular: No hyperdense vessel or unexpected vascular calcification. Skull: The visualized skull base, calvarium and extracranial soft tissues are normal. Sinuses/Orbits: No fluid levels or advanced mucosal thickening of the visualized paranasal sinuses. No mastoid or middle ear effusion. Normal orbits. CTA NECK FINDINGS Skeleton: No acute abnormality or high grade bony spinal canal stenosis. Other neck: Normal pharynx, larynx and major salivary glands. No cervical lymphadenopathy. Unremarkable thyroid gland. Upper chest: No pneumothorax or pleural effusion. No nodules or masses. Aortic  arch: There is calcific atherosclerosis of the aortic arch. Conventional 3 vessel aortic branching pattern. RIGHT carotid system: No dissection, occlusion or aneurysm. There is mixed density atherosclerosis extending into the proximal ICA, resulting in less than 50% stenosis. LEFT carotid system: Normal without aneurysm, dissection or stenosis. Vertebral arteries: Right dominant configuration. There is no dissection, occlusion or flow-limiting stenosis to the skull base (V1-V3 segments). CTA HEAD FINDINGS POSTERIOR CIRCULATION: Vertebral arteries are normal. No proximal occlusion of the anterior or inferior cerebellar arteries. Basilar artery is normal. Superior cerebellar arteries are normal. Posterior cerebral arteries are normal. ANTERIOR CIRCULATION: Atherosclerotic calcification of the internal carotid arteries at the skull base without hemodynamically significant stenosis. Anterior cerebral arteries are normal. Middle cerebral arteries are normal. Venous sinuses: As permitted by contrast timing, patent. Anatomic variants: None Review of the MIP images confirms the above findings. IMPRESSION: 1. No emergent large vessel occlusion. 2. Mild right carotid bifurcation atherosclerosis without hemodynamically significant stenosis. Aortic atherosclerosis (ICD10-I70.0). Electronically Signed   By: Deatra Robinson M.D.   On: 03/17/2024 22:11   MR BRAIN WO CONTRAST Result Date: 03/17/2024 CLINICAL DATA:  Provided history: Neuro deficit, acute, stroke suspected. Additional history provided: weakness, numbness, tingling. History of stroke. EXAM: MRI HEAD WITHOUT CONTRAST TECHNIQUE: Multiplanar, multiecho pulse sequences of the brain and surrounding structures were obtained without intravenous contrast. COMPARISON:  Head CT 09/08/2023. CTA head/neck 09/02/2023. Brain MRI 09/02/2023. FINDINGS: Brain: Moderate-to-advanced generalized cerebral atrophy. Mild cerebellar atrophy. Subcentimeter acute infarcts within the anterior  right frontal lobe periventricular white matter and left periatrial white matter, two total (series 5, images 34 and 32). Chronic lacunar infarcts again demonstrated within the bilateral cerebral hemispheric white matter and within/about the about the deep gray nuclei. Superimposed prominent perivascular spaces within the bilateral deep gray nuclei. Background advanced patchy and confluent T2 FLAIR hyperintense signal abnormality within the cerebral white matter, nonspecific but compatible with chronic small vessel ischemic disease. To a lesser degree, chronic small vessel ischemic changes are also present within the pons. Unchanged small T2 hyperintense focus within the right cerebellar white matter, which may reflect a prominent perivascular space or a chronic lacunar infarct (series 8, image 8). No evidence of an intracranial mass. No chronic intracranial blood products. No extra-axial fluid collection. No midline shift. Vascular: Severe stenosis versus occlusion of the right posterior cerebral artery (at the P2 segment level) was demonstrated on the prior CTA head/neck of 09/02/2023. However, this is poorly reassessed on today's exam in the absence of dedicated vascular imaging. Flow voids preserved elsewhere within the proximal large arterial vessels. Skull and upper cervical spine: No focal worrisome lesion. Sinuses/Orbits: No mass  or acute finding within the imaged orbits. Prior bilateral lens replacement. No significant paranasal sinus disease. IMPRESSION: 1. Two subcentimeter acute infarcts, one within the anterior right frontal lobe periventricular white matter and the other within the left periatrial white matter. 2. Background parenchymal atrophy, chronic small vessel ischemic disease and chronic infarcts, as described. Electronically Signed   By: Jackey Loge D.O.   On: 03/17/2024 16:15   CT Head Wo Contrast Result Date: 03/17/2024 CLINICAL DATA:  Acute stroke suspected.  Right-sided numbness. EXAM:  CT HEAD WITHOUT CONTRAST TECHNIQUE: Contiguous axial images were obtained from the base of the skull through the vertex without intravenous contrast. RADIATION DOSE REDUCTION: This exam was performed according to the departmental dose-optimization program which includes automated exposure control, adjustment of the mA and/or kV according to patient size and/or use of iterative reconstruction technique. COMPARISON:  Head CT 09/08/2023.  MRI brain 03/17/2024. FINDINGS: Brain: No evidence of acute infarction, hemorrhage, hydrocephalus, extra-axial collection or mass lesion/mass effect. There is moderate diffuse atrophy and moderate periventricular white matter hypodensity, likely chronic small vessel ischemic change. There is a stable old lacunar infarct in the left basal ganglia. Vascular: No hyperdense vessel or unexpected calcification. Skull: Normal. Negative for fracture or focal lesion. Sinuses/Orbits: No acute finding. Other: None. IMPRESSION: 1. No acute intracranial process. 2. Moderate diffuse atrophy and moderate chronic small vessel ischemic change. Electronically Signed   By: Darliss Cheney M.D.   On: 03/17/2024 15:58   DG Chest 2 View Result Date: 03/17/2024 CLINICAL DATA:  Shortness of breath. EXAM: CHEST - 2 VIEW COMPARISON:  Chest radiograph dated 05/09/2023. FINDINGS: The heart size and mediastinal contours are within normal limits. No focal consolidation, pleural effusion, or pneumothorax. No acute osseous abnormality. IMPRESSION: No acute cardiopulmonary findings. Electronically Signed   By: Hart Robinsons M.D.   On: 03/17/2024 12:57    Microbiology: Results for orders placed or performed during the hospital encounter of 03/17/24  Resp panel by RT-PCR (RSV, Flu A&B, Covid) Anterior Nasal Swab     Status: None   Collection Time: 03/17/24  1:14 PM   Specimen: Anterior Nasal Swab  Result Value Ref Range Status   SARS Coronavirus 2 by RT PCR NEGATIVE NEGATIVE Final    Comment:  (NOTE) SARS-CoV-2 target nucleic acids are NOT DETECTED.  The SARS-CoV-2 RNA is generally detectable in upper respiratory specimens during the acute phase of infection. The lowest concentration of SARS-CoV-2 viral copies this assay can detect is 138 copies/mL. A negative result does not preclude SARS-Cov-2 infection and should not be used as the sole basis for treatment or other patient management decisions. A negative result may occur with  improper specimen collection/handling, submission of specimen other than nasopharyngeal swab, presence of viral mutation(s) within the areas targeted by this assay, and inadequate number of viral copies(<138 copies/mL). A negative result must be combined with clinical observations, patient history, and epidemiological information. The expected result is Negative.  Fact Sheet for Patients:  BloggerCourse.com  Fact Sheet for Healthcare Providers:  SeriousBroker.it  This test is no t yet approved or cleared by the [place] FDA and  has been authorized for detection and/or diagnosis of SARS-CoV-2 by FDA under an Emergency Use Authorization (EUA). This EUA will remain  in effect (meaning this test can be used) for the duration of the COVID-19 declaration under Section 564(b)(1) of the Act, 21 U.S.C.section 360bbb-3(b)(1), unless the authorization is terminated  or revoked sooner.       Influenza A by  PCR NEGATIVE NEGATIVE Final   Influenza B by PCR NEGATIVE NEGATIVE Final    Comment: (NOTE) The Xpert Xpress SARS-CoV-2/FLU/RSV plus assay is intended as an aid in the diagnosis of influenza from Nasopharyngeal swab specimens and should not be used as a sole basis for treatment. Nasal washings and aspirates are unacceptable for Xpert Xpress SARS-CoV-2/FLU/RSV testing.  Fact Sheet for Patients: BloggerCourse.com  Fact Sheet for Healthcare  Providers: SeriousBroker.it  This test is not yet approved or cleared by the [place] FDA and has been authorized for detection and/or diagnosis of SARS-CoV-2 by FDA under an Emergency Use Authorization (EUA). This EUA will remain in effect (meaning this test can be used) for the duration of the COVID-19 declaration under Section 564(b)(1) of the Act, 21 U.S.C. section 360bbb-3(b)(1), unless the authorization is terminated or revoked.     Resp Syncytial Virus by PCR NEGATIVE NEGATIVE Final    Comment: (NOTE) Fact Sheet for Patients: BloggerCourse.com  Fact Sheet for Healthcare Providers: SeriousBroker.it  This test is not yet approved or cleared by the [place] FDA and has been authorized for detection and/or diagnosis of SARS-CoV-2 by FDA under an Emergency Use Authorization (EUA). This EUA will remain in effect (meaning this test can be used) for the duration of the COVID-19 declaration under Section 564(b)(1) of the Act, 21 U.S.C. section 360bbb-3(b)(1), unless the authorization is terminated or revoked.  Performed at [facility], [address] [place], [place] [address]     Labs: CBC: Recent Labs  Lab 03/17/24 1225 03/18/24 0526 03/19/24 0906  WBC 5.9 6.5 5.7  NEUTROABS 3.8 4.0 3.2  HGB 12.8* 12.5* 13.5  HCT 40.7 39.6 41.4  MCV 94.2 93.6 93.5  PLT 164 145* 145*   Basic Metabolic Panel: Recent Labs  Lab 03/17/24 1225 03/18/24 0526 03/19/24 0906 03/20/24 0531 03/21/24 0505  NA 138 137 136 137 139  K 3.9 3.7 3.5 3.8 3.6  CL 106 106 103 107 108  CO2 26 24 24 23 24   GLUCOSE 123* 90 96 100* 93  BUN 26* 22 27* 28* 27*  CREATININE 1.19 1.00 1.22 1.26* 1.15  CALCIUM 9.0 8.9 9.0 8.8* 9.0  MG  --  2.1 2.3 2.2 2.2  PHOS  --   --   --   --  2.8   Liver Function Tests: Recent Labs  Lab 03/17/24 1225 03/18/24 0526 03/19/24 0906 03/20/24 0531  03/21/24 0505  AST 21 20 22 19   --   ALT 17 17 15 16   --   ALKPHOS 54 49 53 50  --   BILITOT 0.6 0.8 0.8 1.0  --   PROT 7.4 7.0 7.4 7.0  --   ALBUMIN 3.6 3.5 3.7 3.5 3.3*   CBG: Recent Labs  Lab 03/17/24 1018  GLUCAP 154*    Discharge time spent: greater than 30 minutes.  Signed: Jonah Blue, MD [facility] 03/21/2024

## 2024-03-21 NOTE — Progress Notes (Signed)
 Inpatient Rehab Admissions Coordinator:  Received insurance authorization. There is a bed available in CIR for pt today. Dr. Ophelia Charter aware and in agreement. Pt, pt's daughter Hermelinda Dellen, NSG and TOC made aware.   Wolfgang Phoenix, MS, CCC-SLP Admissions Coordinator [phone]

## 2024-03-21 NOTE — Discharge Summary (Signed)
 Physician Discharge Summary  Patient ID: James [name]. MRN: [id number] DOB/AGE: 06/19/1934 89 y.o.  Admit date: 03/21/2024 Discharge date: 03/26/2024  Discharge Diagnoses:  Principal Problem:   Cardioembolic stroke [facility]) CVA prophylaxis BPH Hyperlipidemia Constipation History of tobacco use Descending aortic aneurysm Mood stabilization Peripheral artery disease  Discharged Condition: Stable  Significant Diagnostic Studies: ECHOCARDIOGRAM COMPLETE Result Date: 03/18/2024    ECHOCARDIOGRAM REPORT   Patient Name:   James [name]. Date of Exam: 03/18/2024 Medical Rec #:  [id number]              Height:       68.0 in Accession #:    [id number]             Weight:       162.0 lb Date of Birth:  19-Oct-1934              BSA:          1.869 m Patient Age:    89 years               BP:           150/78 mmHg Patient Gender: M                      HR:           84 bpm. Exam Location:  Inpatient Procedure: 2D Echo, Cardiac Doppler and Color Doppler (Both Spectral and Color            Flow Doppler were utilized during procedure). Indications:    I10 Hypertension  History:        Patient has prior history of Echocardiogram examinations, most                 recent 09/03/2023. Stroke.  Sonographer:    Andrena Bang Referring Phys: [id number] Merrill Abide SHALHOUB IMPRESSIONS  1. Left ventricular ejection fraction, by estimation, is 60 to 65%. The left ventricle has normal function. The left ventricle has no regional wall motion abnormalities. There is mild concentric left ventricular hypertrophy. Left ventricular diastolic parameters are consistent with Grade I diastolic dysfunction (impaired relaxation).  2. Right ventricular systolic function is normal. The right ventricular size is normal.  3. The mitral valve is normal in structure. No evidence of mitral valve regurgitation. No evidence of mitral stenosis.  4. The aortic valve is normal in structure. There is mild calcification of the aortic valve. Aortic  valve regurgitation is not visualized. No aortic stenosis is present.  5. The inferior vena cava is normal in size with greater than 50% respiratory variability, suggesting right atrial pressure of 3 mmHg. FINDINGS  Left Ventricle: Left ventricular ejection fraction, by estimation, is 60 to 65%. The left ventricle has normal function. The left ventricle has no regional wall motion abnormalities. Definity contrast agent was given IV to delineate the left ventricular  endocardial borders. The left ventricular internal cavity size was normal in size. There is mild concentric left ventricular hypertrophy. Left ventricular diastolic parameters are consistent with Grade I diastolic dysfunction (impaired relaxation). Right Ventricle: The right ventricular size is normal. No increase in right ventricular wall thickness. Right ventricular systolic function is normal. Left Atrium: Left atrial size was normal in size. Right Atrium: Right atrial size was normal in size. Pericardium: There is no evidence of pericardial effusion. Mitral Valve: The mitral valve is normal in structure. No evidence of mitral valve regurgitation. No evidence of mitral valve  stenosis. Tricuspid Valve: The tricuspid valve is normal in structure. Tricuspid valve regurgitation is trivial. No evidence of tricuspid stenosis. Aortic Valve: The aortic valve is normal in structure. There is mild calcification of the aortic valve. Aortic valve regurgitation is not visualized. No aortic stenosis is present. Aortic valve mean gradient measures 2.0 mmHg. Aortic valve peak gradient measures 3.4 mmHg. Aortic valve area, by VTI measures 2.56 cm. Pulmonic Valve: The pulmonic valve was not well visualized. Pulmonic valve regurgitation is not visualized. No evidence of pulmonic stenosis. Aorta: The aortic root is normal in size and structure. Venous: The inferior vena cava is normal in size with greater than 50% respiratory variability, suggesting right atrial  pressure of 3 mmHg. IAS/Shunts: No atrial level shunt detected by color flow Doppler.  LEFT VENTRICLE PLAX 2D LVOT diam:     1.80 cm     Diastology LV SV:         44          LV e' medial:    4.68 cm/s LV SV Index:   23          LV E/e' medial:  9.0 LVOT Area:     2.54 cm    LV e' lateral:   7.07 cm/s                            LV E/e' lateral: 6.0  LV Volumes (MOD) LV vol d, MOD A2C: 85.1 ml LV vol d, MOD A4C: 98.1 ml LV vol s, MOD A2C: 28.3 ml LV vol s, MOD A4C: 27.5 ml LV SV MOD A2C:     56.8 ml LV SV MOD A4C:     98.1 ml LV SV MOD BP:      67.5 ml RIGHT VENTRICLE RV S prime:     12.80 cm/s TAPSE (M-mode): 2.2 cm LEFT ATRIUM           Index LA Vol (A2C): 24.9 ml 13.32 ml/m LA Vol (A4C): 23.1 ml 12.36 ml/m  AORTIC VALVE AV Area (Vmax):    2.39 cm AV Area (Vmean):   2.51 cm AV Area (VTI):     2.56 cm AV Vmax:           92.70 cm/s AV Vmean:          57.600 cm/s AV VTI:            0.170 m AV Peak Grad:      3.4 mmHg AV Mean Grad:      2.0 mmHg LVOT Vmax:         87.20 cm/s LVOT Vmean:        56.900 cm/s LVOT VTI:          0.171 m LVOT/AV VTI ratio: 1.01 MITRAL VALVE MV Area (PHT): 4.23 cm    SHUNTS MV E velocity: 42.20 cm/s  Systemic VTI:  0.17 m MV A velocity: 68.40 cm/s  Systemic Diam: 1.80 cm MV E/A ratio:  0.62 Jules Oar MD Electronically signed by Jules Oar MD Signature Date/Time: 03/18/2024/2:06:14 PM    Final    CT ANGIO HEAD NECK W WO CM Result Date: 03/17/2024 CLINICAL DATA:  Stroke/TIA EXAM: CT ANGIOGRAPHY HEAD AND NECK WITH AND WITHOUT CONTRAST TECHNIQUE: Multidetector CT imaging of the head and neck was performed using the standard protocol during bolus administration of intravenous contrast. Multiplanar CT image reconstructions and MIPs were obtained to evaluate the vascular anatomy. Carotid stenosis measurements (  when applicable) are obtained utilizing NASCET criteria, using the distal internal carotid diameter as the denominator. RADIATION DOSE REDUCTION: This exam was performed  according to the departmental dose-optimization program which includes automated exposure control, adjustment of the mA and/or kV according to patient size and/or use of iterative reconstruction technique. CONTRAST:  75mL OMNIPAQUE IOHEXOL 350 MG/ML SOLN COMPARISON:  None Available. FINDINGS: CT HEAD FINDINGS Brain: There is no mass, hemorrhage or extra-axial collection. There is generalized atrophy without lobar predilection. Hypodensity of the white matter is most commonly associated with chronic microvascular disease. Vascular: No hyperdense vessel or unexpected vascular calcification. Skull: The visualized skull base, calvarium and extracranial soft tissues are normal. Sinuses/Orbits: No fluid levels or advanced mucosal thickening of the visualized paranasal sinuses. No mastoid or middle ear effusion. Normal orbits. CTA NECK FINDINGS Skeleton: No acute abnormality or high grade bony spinal canal stenosis. Other neck: Normal pharynx, larynx and major salivary glands. No cervical lymphadenopathy. Unremarkable thyroid gland. Upper chest: No pneumothorax or pleural effusion. No nodules or masses. Aortic arch: There is calcific atherosclerosis of the aortic arch. Conventional 3 vessel aortic branching pattern. RIGHT carotid system: No dissection, occlusion or aneurysm. There is mixed density atherosclerosis extending into the proximal ICA, resulting in less than 50% stenosis. LEFT carotid system: Normal without aneurysm, dissection or stenosis. Vertebral arteries: Right dominant configuration. There is no dissection, occlusion or flow-limiting stenosis to the skull base (V1-V3 segments). CTA HEAD FINDINGS POSTERIOR CIRCULATION: Vertebral arteries are normal. No proximal occlusion of the anterior or inferior cerebellar arteries. Basilar artery is normal. Superior cerebellar arteries are normal. Posterior cerebral arteries are normal. ANTERIOR CIRCULATION: Atherosclerotic calcification of the internal carotid arteries  at the skull base without hemodynamically significant stenosis. Anterior cerebral arteries are normal. Middle cerebral arteries are normal. Venous sinuses: As permitted by contrast timing, patent. Anatomic variants: None Review of the MIP images confirms the above findings. IMPRESSION: 1. No emergent large vessel occlusion. 2. Mild right carotid bifurcation atherosclerosis without hemodynamically significant stenosis. Aortic atherosclerosis (ICD10-I70.0). Electronically Signed   By: Juanetta Nordmann M.D.   On: 03/17/2024 22:11   MR BRAIN WO CONTRAST Result Date: 03/17/2024 CLINICAL DATA:  Provided history: Neuro deficit, acute, stroke suspected. Additional history provided: weakness, numbness, tingling. History of stroke. EXAM: MRI HEAD WITHOUT CONTRAST TECHNIQUE: Multiplanar, multiecho pulse sequences of the brain and surrounding structures were obtained without intravenous contrast. COMPARISON:  Head CT 09/08/2023. CTA head/neck 09/02/2023. Brain MRI 09/02/2023. FINDINGS: Brain: Moderate-to-advanced generalized cerebral atrophy. Mild cerebellar atrophy. Subcentimeter acute infarcts within the anterior right frontal lobe periventricular white matter and left periatrial white matter, two total (series 5, images 34 and 32). Chronic lacunar infarcts again demonstrated within the bilateral cerebral hemispheric white matter and within/about the about the deep gray nuclei. Superimposed prominent perivascular spaces within the bilateral deep gray nuclei. Background advanced patchy and confluent T2 FLAIR hyperintense signal abnormality within the cerebral white matter, nonspecific but compatible with chronic small vessel ischemic disease. To a lesser degree, chronic small vessel ischemic changes are also present within the pons. Unchanged small T2 hyperintense focus within the right cerebellar white matter, which may reflect a prominent perivascular space or a chronic lacunar infarct (series 8, image 8). No evidence of an  intracranial mass. No chronic intracranial blood products. No extra-axial fluid collection. No midline shift. Vascular: Severe stenosis versus occlusion of the right posterior cerebral artery (at the P2 segment level) was demonstrated on the prior CTA head/neck of 09/02/2023. However, this is poorly reassessed on  today's exam in the absence of dedicated vascular imaging. Flow voids preserved elsewhere within the proximal large arterial vessels. Skull and upper cervical spine: No focal worrisome lesion. Sinuses/Orbits: No mass or acute finding within the imaged orbits. Prior bilateral lens replacement. No significant paranasal sinus disease. IMPRESSION: 1. Two subcentimeter acute infarcts, one within the anterior right frontal lobe periventricular white matter and the other within the left periatrial white matter. 2. Background parenchymal atrophy, chronic small vessel ischemic disease and chronic infarcts, as described. Electronically Signed   By: Bascom Lily D.O.   On: 03/17/2024 16:15   CT Head Wo Contrast Result Date: 03/17/2024 CLINICAL DATA:  Acute stroke suspected.  Right-sided numbness. EXAM: CT HEAD WITHOUT CONTRAST TECHNIQUE: Contiguous axial images were obtained from the base of the skull through the vertex without intravenous contrast. RADIATION DOSE REDUCTION: This exam was performed according to the departmental dose-optimization program which includes automated exposure control, adjustment of the mA and/or kV according to patient size and/or use of iterative reconstruction technique. COMPARISON:  Head CT 09/08/2023.  MRI brain 03/17/2024. FINDINGS: Brain: No evidence of acute infarction, hemorrhage, hydrocephalus, extra-axial collection or mass lesion/mass effect. There is moderate diffuse atrophy and moderate periventricular white matter hypodensity, likely chronic small vessel ischemic change. There is a stable old lacunar infarct in the left basal ganglia. Vascular: No hyperdense vessel or  unexpected calcification. Skull: Normal. Negative for fracture or focal lesion. Sinuses/Orbits: No acute finding. Other: None. IMPRESSION: 1. No acute intracranial process. 2. Moderate diffuse atrophy and moderate chronic small vessel ischemic change. Electronically Signed   By: Tyron Gallon M.D.   On: 03/17/2024 15:58   DG Chest 2 View Result Date: 03/17/2024 CLINICAL DATA:  Shortness of breath. EXAM: CHEST - 2 VIEW COMPARISON:  Chest radiograph dated 05/09/2023. FINDINGS: The heart size and mediastinal contours are within normal limits. No focal consolidation, pleural effusion, or pneumothorax. No acute osseous abnormality. IMPRESSION: No acute cardiopulmonary findings. Electronically Signed   By: Mannie Seek M.D.   On: 03/17/2024 12:57    Labs:  Basic Metabolic Panel: Recent Labs  Lab 03/18/24 0526 03/19/24 0906 03/20/24 0531 03/21/24 0505 03/21/24 1355 03/22/24 0457  NA 137 136 137 139  --  138  K 3.7 3.5 3.8 3.6  --  3.7  CL 106 103 107 108  --  108  CO2 24 24 23 24   --  22  GLUCOSE 90 96 100* 93  --  88  BUN 22 27* 28* 27*  --  23  CREATININE 1.00 1.22 1.26* 1.15 1.17 1.29*  CALCIUM 8.9 9.0 8.8* 9.0  --  9.1  MG 2.1 2.3 2.2 2.2  --   --   PHOS  --   --   --  2.8  --   --     CBC: Recent Labs  Lab 03/18/24 0526 03/19/24 0906 03/21/24 1355 03/22/24 0457  WBC 6.5 5.7 5.2 5.7  NEUTROABS 4.0 3.2  --  2.8  HGB 12.5* 13.5 12.6* 12.6*  HCT 39.6 41.4 38.7* 38.4*  MCV 93.6 93.5 91.5 90.1  PLT 145* 145* 145* 157    CBG: No results for input(s): "GLUCAP" in the last 168 hours.  Family history.  Father with prostate cancer denies any colon cancer esophageal cancer or rectal cancer  Brief HPI:   Datron Brakebill. is a 89 y.o. right-handed male with history significant for anxiety, TIA, tobacco use, peripheral artery disease, 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  Of  note after patient's hospitalization and course and 08/2023 after his last TIA/CVA and received  CIR and was discharged home on a Zio patch due to concerns for cryptogenic stroke.  Zio patch did not reveal any evidence of arrhythmia.  He was offered a loop recorder on follow-up cardiology services and declined.  Per chart review patient lives at [facility] independent living facility since November 2024.  Facility provides meals and his children provide transportation to the grocery store and appointments.  Modified independent with rolling walker.  Presented 03/17/2024 with acute onset of left-sided weakness and altered mental status.  He did not receive tPA.  CT/MRI showed 2 subcentimeter acute infarct, 1 within the anterior right frontal lobe periventricular white matter any other within the left periatrial white matter.  Background parenchymal atrophy with chronic small vessel ischemia disease noted.  CTA with no emergent large vessel occlusion.  Admission chemistries unremarkable except glucose 123 BUN 26.  Echocardiogram with ejection fraction of 60 to 65% no wall motion abnormalities grade 1 diastolic dysfunction.  Neurology follow-up maintained on monotherapy of Plavix for CVA prophylaxis.  Lovenox for DVT prophylaxis.  Tolerating mechanical soft diet.  Therapy evaluations completed due to patient's decreased functional mobility left-sided weakness was admitted for a comprehensive rehab program.   Hospital Course: [facility]. was admitted to rehab 03/21/2024 for inpatient therapies to consist of PT, ST and OT at least three hours five days a week. Past admission physiatrist, therapy team and rehab RN have worked together to provide customized collaborative inpatient rehab.  Pertaining to patient's 2 subcentimeter infarcts within the anterior right frontal lobe periventricular white matter and other within the left periatrial white matter as well as history of TIA/CVA in the past status post Zio patch she had declined a loop recorder.  He would follow-up neurology service as well as ongoing  follow-up per cardiology services.  Maintained on monotherapy with Plavix.  Lovenox for DVT prophylaxis.  Pain management use of Neurontin 100 mg twice daily as prior to admission and Tylenol as needed.  Mood stabilization with Xanax 1 mg nightly as needed he was attending full therapies.  History of BPH maintained on Proscar as well as Flomax voiding without difficulty.  Lipitor ongoing for hyperlipidemia.  Bouts of constipation with the use of Colace and MiraLAX.  He was discussed at length no smoking as he had had a history of tobacco use in the past.  Descending aortic aneurysm 3.5 cm as of 9/24 follow-up outpatient.   Blood pressures were monitored on TID basis and controlled and monitored     Rehab course: During patient's stay in rehab weekly team conferences were held to monitor patient's progress, set goals and discuss barriers to discharge. At admission, patient required moderate assist squat pivot transfers moderate assist sit to stand  Physical exam.  Blood pressure 110/72 pulse 87 temperature 97.7 respirations 18 oxygen saturation 96% room air Constitutional.  No acute distress HEENT Head.  Normocephalic and atraumatic Eyes.  Pupils round and reactive to light no discharge without nystagmus Neck.  Supple nontender no JVD without thyromegaly Cardiac regular rate and rhythm without any extra sounds or murmur heard Abdomen.  Soft nontender positive bowel sounds without rebound Respiratory effort normal no respiratory distress without wheeze Extremities.  No clubbing cyanosis or edema Skin.  Warm dry and intact Neurologic.  Patient alert no acute distress.  Makes eye contact with examiner.  Follows simple commands.  Provides name and age with some word finding difficulties  but self corrects.  Cranial nerves II through XII intact.  Motor strength 5 -/5 in bilateral deltoid bicep tricep grip, 5/5 right and 4/5 left hip flexors, knee extensors, ankle dorsi plantarflexion.  Sensation  intact.  He/She  has had improvement in activity tolerance, balance, postural control as well as ability to compensate for deficits. He/She has had improvement in functional use RUE/LUE  and RLE/LLE as well as improvement in awareness.  Working with energy conservation techniques.  Requires min with basic self-care skills secondary to muscle weakness.  Sit to lying activity with supervision and cues.  Chair to bed transfers contact-guard.  Ambulates 132 feet with rollator.  Upper body dressing with supervision.  Lower body dressing contact-guard.  Minimal assist toileting activities.  Contact-guard for bathing.  SLP conducted skilled therapy sessions targeting cognitive retraining goals and dysphagia.  SLP provided education importance of sitting fully upright during meals.  SLP introduced WRAP memory strategies and facilitated controlled memory tasks.  After looking at provided pictures patient recalled all details 100% accuracy.  Full family teaching completed plan discharge to home       Disposition:  Discharge disposition: 06-Home-Health Care Svc        Diet: Regular  Special Instructions: No driving smoking or alcohol  Medications at discharge. 1.  Tylenol as needed 2.  Xanax 0.25 mg nightly as needed 3.  Lipitor 40 mg p.o. daily 4.  Plavix 75 mg p.o. daily 5.  Colace 100 mg p.o. daily 6.  Proscar 5 mg p.o. daily 7.  Neurontin 100 mg p.o. twice daily 8.  MiraLAX daily  9.  Flomax 0.4 mg daily 10.  B complex with vitamin C daily 11.  Vitamin D 1000 units daily 12.  Melatonin 3 mg nightly   30-35 minutes were spent completing discharge summary and discharge planning  Discharge Instructions     Ambulatory referral to Neurology   Complete by: As directed    An appointment is requested in approximately: 4 weeks cardioembolic CVA   Ambulatory referral to Physical Medicine Rehab   Complete by: As directed    Moderate complexity follow-up 1 to 2 weeks cardioembolic CVA         Follow-up Information     Raulkar, Keven Pel, MD Follow up.   Specialty: Physical Medicine and Rehabilitation Why: Office to call for appointment Contact information: [address] [place] [address] [phone]         Arva Lathe, MD Follow up.   Specialty: Internal Medicine Contact information: [address] [place] [address] [phone]         Boyce Byes, MD Follow up.   Specialties: Cardiology, Radiology Why: Call for appointment Contact information: [address] [place] [address] [phone]                 Signed: Sterling Eisenmenger 03/25/2024, 4:42 AM

## 2024-03-21 NOTE — TOC Progression Note (Signed)
 Transition of Care (TOC) - Progression Note    Patient Details  Name: James [name]. MRN: [id number] Date of Birth: [birth year]  Transition of Care [facility]) CM/SW Contact  Otelia Santee, LCSW Phone Number: 03/21/2024, 10:22 AM  Clinical Narrative:    Insurance Berkley Harvey has been initiated by [employer] for placement. TOC will continue to follow.    Expected Discharge Plan: IP Rehab Facility Barriers to Discharge: Other (must enter comment) (CIR approval, insurance authorization)  Expected Discharge Plan and Services In-house Referral: Clinical Social Work Discharge Planning Services: NA Post Acute Care Choice: IP Rehab Living arrangements for the past 2 months: Independent Living Facility                 DME Arranged: N/A DME Agency: NA                   Social Determinants of Health (SDOH) Interventions SDOH Screenings   Food Insecurity: No Food Insecurity (03/17/2024)  Housing: Low Risk  (03/17/2024)  Transportation Needs: No Transportation Needs (03/17/2024)  Utilities: Not At Risk (03/17/2024)  Depression (PHQ2-9): Low Risk  (10/02/2023)  Social Connections: Moderately Integrated (03/18/2024)  Tobacco Use: Medium Risk (03/17/2024)    Readmission Risk Interventions     No data to display

## 2024-03-21 NOTE — Progress Notes (Signed)
 Signed     Expand All Collapse All PMR Admission Coordinator Pre-Admission Assessment   Patient: James [name]. is an 89 y.o., male MRN: [id number] DOB: 1934-07-24 Height: 5\' 8"  (172.7 cm) Weight: 73.5 kg   Insurance Information HMO:     PPO: yes     PCP:      IPA:      80/20:      OTHER:  PRIMARY: U[employer] Medicare      Policy#: [id number]      Subscriber: patient CM Name: TBD      Phone#: TBD     Fax#: [phone] Pre-Cert#: [id number]  Received approval from [place] on 03/21/24. Pt approved from 03/21/24-03/28/24. Review date will be 03/28/24.    Employer:  Benefits:  Phone #:  [phone]    Name:  Eff. Date: 12/16/23     Deduct: $125 ($125 met)      Out of Pocket Max: $900 ($241.43 met)      Life Max: NA CIR: $125 co-pay/admission      SNF: 100% coverage for days 1-20, $80 co-pay/day for days 21-31, 100% coverage for days 32-100 Outpatient: $45 co-pay/visit     Co-Pay:  Home Health: 100% coverage      Co-Pay:  DME: 80% coverage     Co-Pay: 20% co-insurance Providers: in-network SECONDARY:       Policy#:      Phone#:    Artist:       Phone#:    The Data processing manager" for patients in Inpatient Rehabilitation Facilities with attached "Privacy Act Statement-Health Care Records" was provided and verbally reviewed with: Patient and Family   Emergency Contact Information Contact Information       Name Relation Home Work [place] [phone]    [employer] Daughter     [phone]    Ahmaud, Duthie Daughter     [phone]    Milas Kocher     [phone]         Other Contacts   None on File        Current Medical History  Patient Admitting Diagnosis: CVA History of Present Illness: Pt is an 89 year old male with medical hx significant for: anxiety disorder, PAD, descending aortic aneurysm, 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 Pt  presented to [facility] on 03/17[facility]24 d/t new onset of right-sided weakness. MRI revealed 2 subcentimeter acute infarcts one within the anterior right frontal lobe periventricular white matter and the other within the left periatrial white matter. Pt not a candidate for tPA. CTA negative for LVO. Therapy evaluations completed and CIR recommended d/t pt's deficits in functional mobility.  Complete NIHSS TOTAL: 1   Patient's medical record from [facility] has been reviewed by the rehabilitation admission coordinator and physician.   Past Medical History      Past Medical History:  Diagnosis Date   Anxiety     TIA (transient ischemic attack)            Has the patient had major surgery during 100 days prior to admission? No   Family History   family history includes Prostate cancer in his father.   Current Medications  Current Medications    Current Facility-Administered Medications:    acetaminophen (TYLENOL) tablet 650 mg, 650 mg, Oral, Q6H PRN **OR** acetaminophen (TYLENOL) suppository 650 mg, 650 mg, Rectal, Q6H PRN, Shalhoub, Deno Lunger, MD   ALPRAZolam Prudy Feeler) tablet 1 mg, 1 mg, Oral,  QHS PRN, Marinda Elk, MD, 1 mg at 03/20/24 2104   atorvastatin (LIPITOR) tablet 40 mg, 40 mg, Oral, Daily, Shalhoub, Deno Lunger, MD, 40 mg at 03/20/24 0945   clopidogrel (PLAVIX) tablet 75 mg, 75 mg, Oral, Daily, Shalhoub, Deno Lunger, MD, 75 mg at 03/20/24 0944   docusate sodium (COLACE) capsule 100 mg, 100 mg, Oral, Daily, Shalhoub, Deno Lunger, MD, 100 mg at 03/20/24 0945   enoxaparin (LOVENOX) injection 40 mg, 40 mg, Subcutaneous, Q24H, Shalhoub, Deno Lunger, MD, 40 mg at 03/20/24 2104   finasteride (PROSCAR) tablet 5 mg, 5 mg, Oral, Daily, Shalhoub, Deno Lunger, MD, 5 mg at 03/20/24 0945   gabapentin (NEURONTIN) capsule 100 mg, 100 mg, Oral, BID, Shalhoub, Deno Lunger, MD, 100 mg at 03/20/24 2128   hydrALAZINE (APRESOLINE) injection 10 mg, 10 mg, Intravenous, Q6H PRN, Shalhoub, Deno Lunger,  MD   influenza vaccine adjuvanted (FLUAD) injection 0.5 mL, 0.5 mL, Intramuscular, Tomorrow-1000, Shalhoub, Deno Lunger, MD   ondansetron (ZOFRAN) tablet 4 mg, 4 mg, Oral, Q6H PRN **OR** ondansetron (ZOFRAN) injection 4 mg, 4 mg, Intravenous, Q6H PRN, Shalhoub, Deno Lunger, MD   pneumococcal 20-valent conjugate vaccine (PREVNAR 20) injection 0.5 mL, 0.5 mL, Intramuscular, Tomorrow-1000, Shalhoub, Deno Lunger, MD   polyethylene glycol (MIRALAX / GLYCOLAX) packet 17 g, 17 g, Oral, Daily PRN, Shalhoub, Deno Lunger, MD   polyvinyl alcohol (LIQUIFILM TEARS) 1.4 % ophthalmic solution 2 drop, 2 drop, Both Eyes, PRN, Shalhoub, Deno Lunger, MD   tamsulosin [facility]) capsule 0.4 mg, 0.4 mg, Oral, Daily, Shalhoub, Deno Lunger, MD, 0.4 mg at 03/20/24 0944     Patients Current Diet:  Diet Order                  DIET DYS 3 Room service appropriate? Yes; Fluid consistency: Thin  Diet effective now                         Precautions / Restrictions Precautions Precautions: Fall Restrictions Weight Bearing Restrictions Per Provider Order: No    Has the patient had 2 or more falls or a fall with injury in the past year? No   Prior Activity Level Limited Community (1-2x/wk): MD appointments   Prior Functional Level Self Care: Did the patient need help bathing, dressing, using the toilet or eating? Independent   Indoor Mobility: Did the patient need assistance with walking from room to room (with or without device)? Independent   Stairs: Did the patient need assistance with internal or external stairs (with or without device)? Unknown (pt avoids stairs)   Functional Cognition: Did the patient need help planning regular tasks such as shopping or remembering to take medications? Independent   Patient Information Are you of Hispanic, Latino/a,or Spanish origin?: X. Patient unable to respond, A. No, not of Hispanic, Latino/a, or Spanish origin What is your race?: X. Patient unable to respond, A. White Do you need  or want an interpreter to communicate with a doctor or health care staff?: 9. Unable to respond   Patient's Response To:  Health Literacy and Transportation Is the patient able to respond to health literacy and transportation needs?: No Health Literacy - How often do you need to have someone help you when you read instructions, pamphlets, or other written material from your doctor or pharmacy?: Patient unable to respond   Home Assistive Devices / Equipment Home Equipment: Rollator (4 wheels), Shower seat, Grab bars - toilet, Grab bars - tub/shower   Prior  Device Use: Indicate devices/aids used by the patient prior to current illness, exacerbation or injury? Walker   Current Functional Level Cognition   Arousal/Alertness: Awake/alert Overall Cognitive Status: Impaired/Different from baseline Orientation Level: Oriented X4 Attention: Sustained Sustained Attention: Appears intact Memory: Impaired Memory Impairment: Retrieval deficit Awareness: Appears intact Problem Solving: Appears intact Safety/Judgment: Appears intact    Extremity Assessment (includes Sensation/Coordination)   Upper Extremity Assessment: Generalized weakness, Right hand dominant  Lower Extremity Assessment: Defer to PT evaluation RLE Deficits / Details: grossly weak, ankle dorsiflexion past neutral, knee extension 3-/5, hip abduction 3/5, hip adduction 3/5, hip flexion 3+/5 RLE Sensation:  ("heavy feet") LLE Deficits / Details: grossly weak, ankle dorsiflexion past neutral, knee extension 3+/5, hip abduction 3/5, hip adduction 3/5, hip flexion 3-/5 LLE Sensation:  ("heavy feet")     ADLs   Overall ADL's : Needs assistance/impaired Eating/Feeding: Independent Grooming: Wash/dry hands, Wash/dry face, Contact guard assist, Sitting Upper Body Bathing: Contact guard assist, Sitting Lower Body Bathing: Moderate assistance, Sitting/lateral leans Upper Body Dressing : Contact guard assist, Sitting Lower Body  Dressing: Moderate assistance, Sitting/lateral leans, Sit to/from stand Toilet Transfer: Moderate assistance, Stand-pivot, Rolling walker (2 wheels), Cueing for safety Toileting- Clothing Manipulation and Hygiene: Moderate assistance, Sit to/from stand Functional mobility during ADLs: Cueing for sequencing, Cueing for safety, Rolling walker (2 wheels), Moderate assistance     Mobility   Overal bed mobility: Needs Assistance Bed Mobility: Supine to Sit Supine to sit: Mod assist, HOB elevated, Used rails General bed mobility comments: pt in chair     Transfers   Overall transfer level: Needs assistance Equipment used: Rolling walker (2 wheels) Transfers: Sit to/from Stand, Bed to chair/wheelchair/BSC Sit to Stand: Mod assist Bed to/from chair/wheelchair/BSC transfer type:: Stand pivot Squat pivot transfers: Mod assist General transfer comment: pt unsteady, knees buckling     Ambulation / Gait / Stairs / Wheelchair Mobility         Posture / Balance Dynamic Sitting Balance Sitting balance - Comments: close supv seated EOB, no formal LOB any direction but prefers UEs on bed to supposed self Balance Overall balance assessment: Needs assistance Sitting-balance support: Feet supported Sitting balance-Leahy Scale: Fair Sitting balance - Comments: close supv seated EOB, no formal LOB any direction but prefers UEs on bed to supposed self Standing balance support: During functional activity, Bilateral upper extremity supported, Reliant on assistive device for balance Standing balance-Leahy Scale: Poor     Special needs/care consideration NA    Previous Home Environment (from acute therapy documentation) Living Arrangements: Spouse/significant other  Lives With: Spouse Available Help at Discharge: Family, Available 24 hours/day Type of Home: Independent living facility Home Layout: One level Home Access: Level entry Bathroom Shower/Tub: Health visitor: Handicapped  height Bathroom Accessibility: Yes How Accessible: Accessible via walker Home Care Services: Yes Type of Home Care Services: Home PT Additional Comments: Pt reports moved into [facility] in November 2024. Facility provides meals, pt's children provide transportation to grocery store and appointments.   Discharge Living Setting Plans for Discharge Living Setting: Other (Comment) (Independent Living Facilty) Type of Home at Discharge: Independent living facility Care Facility Name at Discharge: [facility] Discharge Home Layout: One level Discharge Home Access: Level entry Discharge Bathroom Shower/Tub: Walk-in shower Discharge Bathroom Toilet: Handicapped height Discharge Bathroom Accessibility: Yes How Accessible: Accessible via walker Does the patient have any problems obtaining your medications?: No   Social/Family/Support Systems Anticipated Caregiver: Allena Earing (daughter), Loc Feinstein (wife), Kinneth Fujiwara (son) Anticipated Industrial/product designer  Information: Margret [phone]), Cristal Deer [phone]), [name] [phone]) Caregiver Availability: 24/7 Discharge Plan Discussed with Primary Caregiver: Yes Is Caregiver In Agreement with Plan?: Yes Does Caregiver/Family have Issues with Lodging/Transportation while Pt is in Rehab?: No   Goals Patient/Family Goal for Rehab: Supervision: PT/OT/ST Expected length of stay: 14-16 days Pt/Family Agrees to Admission and willing to participate: Yes Program Orientation Provided & Reviewed with Pt/Caregiver Including Roles  & Responsibilities: Yes   Decrease burden of Care through IP rehab admission: NA   Possible need for SNF placement upon discharge: Not anticipated   Patient Condition: I have reviewed medical records from [facility], spoken with CSW, and patient and daughter. I discussed via phone for inpatient rehabilitation assessment.  Patient will benefit from ongoing PT, OT, and SLP, can actively  participate in 3 hours of therapy a day 5 days of the week, and can make measurable gains during the admission.  Patient will also benefit from the coordinated team approach during an Inpatient Acute Rehabilitation admission.  The patient will receive intensive therapy as well as Rehabilitation physician, nursing, social worker, and care management interventions.  Due to safety, disease management, medication administration, and patient education the patient requires 24 hour a day rehabilitation nursing.  The patient is currently Mod A with mobility and Contact G-Mod A with basic ADLs.  Discharge setting and therapy post discharge at  ILF  is anticipated.  Patient has agreed to participate in the Acute Inpatient Rehabilitation Program and will admit today.   Preadmission Screen Completed By:  Domingo Pulse, 03/21/2024 10:17 AM ______________________________________________________________________   Discussed status with Dr. Wynn Banker on 03/21/24 at 10:22 AM and received approval for admission today.   Admission Coordinator:  Domingo Pulse, CCC-SLP, time 10:22 AM/Date 03/21/24     Assessment/Plan: Diagnosis: RIght hemiparesis due to Left peri atrial infarct Does the need for close, 24 hr/day Medical supervision in concert with the patient's rehab needs make it unreasonable for this patient to be served in a less intensive setting? Yes Co-Morbidities requiring supervision/potential complications: hx aortic aneurysm Due to bladder management, bowel management, safety, skin/wound care, disease management, medication administration, pain management, and patient education, does the patient require 24 hr/day rehab nursing? Yes Does the patient require coordinated care of a physician, rehab nurse, PT, OT, and SLP to address physical and functional deficits in the context of the above medical diagnosis(es)? Yes Addressing deficits in the following areas: balance, endurance, locomotion,  strength, transferring, bowel/bladder control, bathing, dressing, feeding, toileting, and cognition Can the patient actively participate in an intensive therapy program of at least 3 hrs of therapy 5 days a week? Yes The potential for patient to make measurable gains while on inpatient rehab is good Anticipated functional outcomes upon discharge from inpatient rehab: supervision PT, supervision OT, supervision SLP Estimated rehab length of stay to reach the above functional goals is: 14-16d Anticipated discharge destination: Home 10. Overall Rehab/Functional Prognosis: good     MD Signature: Erick Colace M.D. [facility] Fellow Am Acad of Phys Med and Rehab Diplomate Am Board of Electrodiagnostic Med Fellow Am Board of Interventional Pain

## 2024-03-21 NOTE — H&P (Signed)
 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  Of note, after patient's hospitalization and course in 08/2023 (CIR Dr.Yuri)  after his last TIA/CVA was discharged home on a Zio patch due to concerns for cryptogenic stroke.  Zio patch did not reveal any evidence of arrhythmia.  He was offered a loop recorder on follow-up cardiology services and declined.  Per chart review patient lives at [facility] independent living facility since November 2024.  Facility provides meals and his children provide transportation to the grocery store and appointments.  Modified dependent with rolling walker.  Presented 03/17/2024 with acute onset left-sided weakness and altered mental status.  He did not receive tPA.  CT/MRI showed 2 subcentimeter acute infarcts, 1 within the anterior right frontal lobe periventricular white matter and the other within the left periatrial white matter.  Background parenchymal atrophy with chronic small vessel ischemic disease noted.  CTA with no emergent large vessel occlusion.  Admission chemistries unremarkable except glucose 123, BUN 26.  Echocardiogram with ejection fraction of 60 to 65% no wall motion abnormalities grade 1 diastolic dysfunction.  Neurology follow-up Dr. Armanda Magic currently maintained on monotherapy of Plavix for CVA prophylaxis.  Lovenox for DVT prophylaxis.  Tolerating mechanical soft diet.  Therapy evaluations completed due to patient decreased functional mobility and left-sided weakness was admitted for a comprehensive rehab program.   Pt feels like his right side is still a little weak from his stroke in September.  His left side is also weak now from the more recent stroke. Review of Systems  Constitutional:  Negative for  chills and fever.  HENT:  Negative for hearing loss.   Eyes:  Negative for blurred vision and double vision.  Respiratory:  Negative for cough, shortness of breath and wheezing.   Cardiovascular:  Positive for palpitations. Negative for chest pain, leg swelling and PND.  Gastrointestinal:  Positive for constipation. Negative for heartburn, nausea and vomiting.  Genitourinary:  Positive for frequency and urgency. Negative for dysuria, flank pain and hematuria.  Musculoskeletal:  Positive for joint pain and myalgias.  Skin:  Negative for rash.  Neurological:  Positive for focal weakness and weakness.  Psychiatric/Behavioral:         Anxiety  All other systems reviewed and are negative.       Past Medical History:  Diagnosis Date   Anxiety     TIA (transient ischemic attack)               Past Surgical History:  Procedure Laterality Date   HAND SURGERY Left     HEMORRHOID SURGERY       TONSILLECTOMY AND ADENOIDECTOMY                 Family History  Problem Relation Age of Onset   Prostate cancer Father          Social History:  reports that he has quit smoking. His smoking use included cigarettes. He has never used smokeless tobacco. He reports that he does not currently use alcohol. He reports that he does not use drugs. Allergies:  Allergies  No Known Allergies         Medications Prior to Admission  Medication Sig Dispense Refill   acetaminophen (TYLENOL) 325 MG tablet  Take 2 tablets (650 mg total) by mouth every 6 (six) hours as needed for mild pain (or Fever >/= 101).       ALPRAZolam (XANAX) 1 MG tablet Take 1 tablet (1 mg total) by mouth at bedtime. 30 tablet 0   aspirin EC 81 MG tablet Take 1 tablet (81 mg total) by mouth daily. Swallow whole.       clopidogrel (PLAVIX) 75 MG tablet Take 1 tablet (75 mg total) by mouth daily. 30 tablet 0   docusate sodium (COLACE) 100 MG capsule Take 100 mg by mouth in the morning.       finasteride (PROSCAR) 5 MG tablet Take 1  tablet (5 mg total) by mouth daily. 30 tablet 0   polyethylene glycol (MIRALAX / GLYCOLAX) 17 g packet Take 17 g by mouth daily. (Patient taking differently: Take 17 g by mouth daily as needed for mild constipation.)       rosuvastatin (CRESTOR) 10 MG tablet Take 10 mg by mouth daily.       tamsulosin (FLOMAX) 0.4 MG CAPS capsule Take 1 capsule (0.4 mg total) by mouth daily. 30 capsule 0   atorvastatin (LIPITOR) 40 MG tablet Take 1 tablet (40 mg total) by mouth daily. (Patient not taking: Reported on 03/17/2024) 30 tablet 0   DULoxetine (CYMBALTA) 30 MG capsule Take 1 capsule (30 mg total) by mouth daily. (Patient not taking: Reported on 03/17/2024) 30 capsule 0   senna-docusate (SENOKOT-S) 8.6-50 MG tablet Take 2 tablets by mouth at bedtime. (Patient not taking: Reported on 03/17/2024)       traZODone (DESYREL) 100 MG tablet Take 1 tablet (100 mg total) by mouth at bedtime. (Patient not taking: Reported on 03/17/2024) 30 tablet 0              Home: Home Living Family/patient expects to be discharged to:: Assisted living Living Arrangements: Spouse/significant other Available Help at Discharge: Family, Available 24 hours/day Type of Home: Independent living facility Home Access: Level entry Home Layout: One level Bathroom Shower/Tub: Health visitor: Handicapped height Bathroom Accessibility: Yes Home Equipment: Rollator (4 wheels), Shower seat, Grab bars - toilet, Grab bars - tub/shower Additional Comments: Pt reports moved into [facility] in November 2024. Facility provides meals, pt's children provide transportation to grocery store and appointments.  Lives With: Spouse   Functional History: Prior Function Prior Level of Function : Independent/Modified Independent Mobility Comments: mod ind with rollator walker for facility distances ADLs Comments: mod ind with rollator walker for self care, wife drives   Functional Status:  Mobility: Bed Mobility Overal bed mobility:  Needs Assistance Bed Mobility: Supine to Sit Supine to sit: Mod assist, HOB elevated, Used rails General bed mobility comments: pt in chair Transfers Overall transfer level: Needs assistance Equipment used: Rolling walker (2 wheels) Transfers: Sit to/from Stand, Bed to chair/wheelchair/BSC Sit to Stand: Mod assist Bed to/from chair/wheelchair/BSC transfer type:: Stand pivot Squat pivot transfers: Mod assist General transfer comment: pt unsteady, knees buckling   ADL: ADL Overall ADL's : Needs assistance/impaired Eating/Feeding: Independent Grooming: Wash/dry hands, Wash/dry face, Contact guard assist, Sitting Upper Body Bathing: Contact guard assist, Sitting Lower Body Bathing: Moderate assistance, Sitting/lateral leans Upper Body Dressing : Contact guard assist, Sitting Lower Body Dressing: Moderate assistance, Sitting/lateral leans, Sit to/from stand Toilet Transfer: Moderate assistance, Stand-pivot, Rolling walker (2 wheels), Cueing for safety Toileting- Clothing Manipulation and Hygiene: Moderate assistance, Sit to/from stand Functional mobility during ADLs: Cueing for sequencing, Cueing for safety, Rolling walker (2  wheels), Moderate assistance   Cognition: Cognition Overall Cognitive Status: Impaired/Different from baseline Arousal/Alertness: Awake/alert Orientation Level: Oriented X4 Attention: Sustained Sustained Attention: Appears intact Memory: Impaired Memory Impairment: Retrieval deficit Awareness: Appears intact Problem Solving: Appears intact Safety/Judgment: Appears intact Cognition Arousal: Alert Behavior During Therapy: WFL for tasks assessed/performed Overall Cognitive Status: Impaired/Different from baseline   Physical Exam: Blood pressure 110/72, pulse 87, temperature 97.7 F (36.5 C), resp. rate 18, height 5\' 8"  (1.727 m), weight 73.5 kg, SpO2 96%. Physical Exam Neurological:     Comments: Patient is alert.  No acute distress.  Makes eye contact  with examiner.  Follows simple commands.  Provides name and age with some word finding difficulty but self corrects.      General: No acute distress Mood and affect are appropriate Heart: Regular rate and rhythm no rubs murmurs or extra sounds Lungs: Clear to auscultation, breathing unlabored, no rales or wheezes Abdomen: Positive bowel sounds, soft nontender to palpation, nondistended Extremities: No clubbing, cyanosis, or edema Skin: No evidence of breakdown, no evidence of rash Neurologic: Cranial nerves II through XII intact, motor strength is 5-/5 in bilateral deltoid, bicep, tricep, grip, 5/5 right and 4/5 left hip flexor, knee extensors, ankle dorsiflexor and plantar flexor Sensory exam normal sensation to light touch in LEs except Left great toe Cerebellar exam normal finger to nose to finger as well as heel to shin in bilateral upper and lower extremities Finger to thumb opposition is intact. Oriented x 3 Speech without dysarthria Musculoskeletal: Full range of motion in all 4 extremities. No joint swelling    Lab Results Last 48 Hours        Results for orders placed or performed during the hospital encounter of 03/17/24 (from the past 48 hours)  Comprehensive metabolic panel with GFR     Status: Abnormal    Collection Time: 03/20/24  5:31 AM  Result Value Ref Range    Sodium 137 135 - 145 mmol/L    Potassium 3.8 3.5 - 5.1 mmol/L    Chloride 107 98 - 111 mmol/L    CO2 23 22 - 32 mmol/L    Glucose, Bld 100 (H) 70 - 99 mg/dL      Comment: Glucose reference range applies only to samples taken after fasting for at least 8 hours.    BUN 28 (H) 8 - 23 mg/dL    Creatinine, Ser 1.61 (H) 0.61 - 1.24 mg/dL    Calcium 8.8 (L) 8.9 - 10.3 mg/dL    Total Protein 7.0 6.5 - 8.1 g/dL    Albumin 3.5 3.5 - 5.0 g/dL    AST 19 15 - 41 U/L    ALT 16 0 - 44 U/L    Alkaline Phosphatase 50 38 - 126 U/L    Total Bilirubin 1.0 0.0 - 1.2 mg/dL    GFR, Estimated 55 (L) >60 mL/min      Comment:  (NOTE) Calculated using the CKD-EPI Creatinine Equation (2021)      Anion gap 7 5 - 15      Comment: Performed at [facility], [address], [place], [place] [address]  Magnesium     Status: None    Collection Time: 03/20/24  5:31 AM  Result Value Ref Range    Magnesium 2.2 1.7 - 2.4 mg/dL      Comment: Performed at [facility], [address] [place], [place] [address]  Renal function panel     Status: Abnormal  Collection Time: 03/21/24  5:05 AM  Result Value Ref Range    Sodium 139 135 - 145 mmol/L    Potassium 3.6 3.5 - 5.1 mmol/L    Chloride 108 98 - 111 mmol/L    CO2 24 22 - 32 mmol/L    Glucose, Bld 93 70 - 99 mg/dL      Comment: Glucose reference range applies only to samples taken after fasting for at least 8 hours.    BUN 27 (H) 8 - 23 mg/dL    Creatinine, Ser 1.61 0.61 - 1.24 mg/dL    Calcium 9.0 8.9 - 09.6 mg/dL    Phosphorus 2.8 2.5 - 4.6 mg/dL    Albumin 3.3 (L) 3.5 - 5.0 g/dL    GFR, Estimated >04 >54 mL/min      Comment: (NOTE) Calculated using the CKD-EPI Creatinine Equation (2021)      Anion gap 7 5 - 15      Comment: Performed at [facility], [address], [place], [place] [address]  Magnesium     Status: None    Collection Time: 03/21/24  5:05 AM  Result Value Ref Range    Magnesium 2.2 1.7 - 2.4 mg/dL      Comment: Performed at [facility], [address], [place], [place] [address]      Imaging Results (Last 48 hours)  No results found.         Blood pressure 110/72, pulse 87, temperature 97.7 F (36.5 C), resp. rate 18, height 5\' 8"  (1.727 m), weight 73.5 kg, SpO2 96%.   Medical Problem List and Plan: 1. Functional deficits secondary to 2 subcentimeter infarcts within the anterior right frontal lobe periventricular white matter any other within the left periatrial white matter as well as history of TIA CVA in the past status post Zio patch and patient declined a  loop recorder.             -patient may  shower             -ELOS/Goals: 14-16d Supervision goals  2.  Antithrombotics: -DVT/anticoagulation:  Pharmaceutical: Lovenox             -antiplatelet therapy: Monotherapy with Plavix 75 mg daily 3. Pain Management: Neurontin 100 mg twice daily, Tylenol as needed 4. Mood/Behavior/Sleep: Xanax 1 mg nightly as needed             -antipsychotic agents: N/A 5. Neuropsych/cognition: This patient is capable of making decisions on his own behalf. 6. Skin/Wound Care: Routine skin checks 7. Fluids/Electrolytes/Nutrition: Routine in and outs with follow-up chemistries 8.  BPH.  Proscar 5 mg daily, Flomax 0.4 mg daily. 9.  Hyperlipidemia.  Lipitor 10.  Constipation.  Colace 100 mg daily, MiraLAX daily as needed 11.  History of tobacco use.  Provide counseling 12.  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  Follow-up outpatient       Charlton Amor, PA-C 03/21/2024 "I have personally performed a face to face diagnostic evaluation of this patient.  Additionally, I have reviewed and concur with the physician assistant's documentation above." Erick Colace M.D. [facility] Fellow Am [employer] and Rehab Diplomate Am Board of Electrodiagnostic Med Fellow Am Board of Interventional Pain

## 2024-03-22 DIAGNOSIS — I639 Cerebral infarction, unspecified: Secondary | ICD-10-CM | POA: Diagnosis not present

## 2024-03-22 LAB — CBC WITH DIFFERENTIAL/PLATELET
Abs Immature Granulocytes: 0.01 10*3/uL (ref 0.00–0.07)
Basophils Absolute: 0.1 10*3/uL (ref 0.0–0.1)
Basophils Relative: 2 %
Eosinophils Absolute: 0.4 10*3/uL (ref 0.0–0.5)
Eosinophils Relative: 8 %
HCT: 38.4 % — ABNORMAL LOW (ref 39.0–52.0)
Hemoglobin: 12.6 g/dL — ABNORMAL LOW (ref 13.0–17.0)
Immature Granulocytes: 0 %
Lymphocytes Relative: 33 %
Lymphs Abs: 1.9 10*3/uL (ref 0.7–4.0)
MCH: 29.6 pg (ref 26.0–34.0)
MCHC: 32.8 g/dL (ref 30.0–36.0)
MCV: 90.1 fL (ref 80.0–100.0)
Monocytes Absolute: 0.5 10*3/uL (ref 0.1–1.0)
Monocytes Relative: 9 %
Neutro Abs: 2.8 10*3/uL (ref 1.7–7.7)
Neutrophils Relative %: 48 %
Platelets: 157 10*3/uL (ref 150–400)
RBC: 4.26 MIL/uL (ref 4.22–5.81)
RDW: 13.6 % (ref 11.5–15.5)
WBC: 5.7 10*3/uL (ref 4.0–10.5)
nRBC: 0 % (ref 0.0–0.2)

## 2024-03-22 LAB — COMPREHENSIVE METABOLIC PANEL WITH GFR
ALT: 17 U/L (ref 0–44)
AST: 21 U/L (ref 15–41)
Albumin: 3.1 g/dL — ABNORMAL LOW (ref 3.5–5.0)
Alkaline Phosphatase: 50 U/L (ref 38–126)
Anion gap: 8 (ref 5–15)
BUN: 23 mg/dL (ref 8–23)
CO2: 22 mmol/L (ref 22–32)
Calcium: 9.1 mg/dL (ref 8.9–10.3)
Chloride: 108 mmol/L (ref 98–111)
Creatinine, Ser: 1.29 mg/dL — ABNORMAL HIGH (ref 0.61–1.24)
GFR, Estimated: 53 mL/min — ABNORMAL LOW (ref >60)
Glucose, Bld: 88 mg/dL (ref 70–99)
Potassium: 3.7 mmol/L (ref 3.5–5.1)
Sodium: 138 mmol/L (ref 135–145)
Total Bilirubin: 0.8 mg/dL (ref 0.0–1.2)
Total Protein: 6.8 g/dL (ref 6.5–8.1)

## 2024-03-22 LAB — VITAMIN D 25 HYDROXY (VIT D DEFICIENCY, FRACTURES): Vit D, 25-Hydroxy: 24.14 ng/mL — ABNORMAL LOW (ref 30–100)

## 2024-03-22 MED ORDER — TAMSULOSIN HCL 0.4 MG PO CAPS
0.4000 mg | ORAL_CAPSULE | Freq: Every day | ORAL | Status: DC
Start: 2024-03-22 — End: 2024-03-26
  Administered 2024-03-22 – 2024-03-25 (×4): 0.4 mg via ORAL
  Filled 2024-03-22 (×4): qty 1

## 2024-03-22 MED ORDER — VITAMIN D 25 MCG (1000 UNIT) PO TABS
1000.0000 [IU] | ORAL_TABLET | Freq: Every day | ORAL | Status: DC
  Administered 2024-03-22 – 2024-03-26 (×5): 1000 [IU] via ORAL
  Filled 2024-03-22 (×5): qty 1

## 2024-03-22 MED ORDER — MAGNESIUM HYDROXIDE 400 MG/5ML PO SUSP
15.0000 mL | Freq: Once | ORAL | Status: AC
  Administered 2024-03-22: 15 mL via ORAL
  Filled 2024-03-22: qty 30

## 2024-03-22 MED ORDER — ALPRAZOLAM 0.25 MG PO TABS
0.2500 mg | ORAL_TABLET | Freq: Every day | ORAL | Status: DC
  Administered 2024-03-22 – 2024-03-25 (×4): 0.25 mg via ORAL
  Filled 2024-03-22 (×4): qty 1

## 2024-03-22 MED ORDER — L-METHYLFOLATE-B6-B12 3-35-2 MG PO TABS
1.0000 | ORAL_TABLET | Freq: Every day | ORAL | Status: DC
  Administered 2024-03-22 – 2024-03-26 (×5): 1 via ORAL
  Filled 2024-03-22 (×5): qty 1

## 2024-03-22 MED ORDER — GUAIFENESIN ER 600 MG PO TB12
600.0000 mg | ORAL_TABLET | Freq: Two times a day (BID) | ORAL | Status: DC
  Filled 2024-03-22: qty 1

## 2024-03-22 MED ORDER — MELATONIN 3 MG PO TABS
3.0000 mg | ORAL_TABLET | Freq: Every day | ORAL | Status: DC
  Administered 2024-03-22 – 2024-03-25 (×4): 3 mg via ORAL
  Filled 2024-03-22 (×4): qty 1

## 2024-03-22 NOTE — Progress Notes (Signed)
 Inpatient Rehabilitation  Patient information reviewed and entered into eRehab system by [facility]. Karen Kays., CCC/SLP, PPS Coordinator.  Information including medical coding, functional ability and quality indicators will be reviewed and updated through discharge.

## 2024-03-22 NOTE — Evaluation (Signed)
 Physical Therapy Assessment and Plan  Patient Details  Name: James [name]. MRN: [id number] Date of Birth: [birth year]  PT Diagnosis: Abnormal posture, Abnormality of gait, Cognitive deficits, Coordination disorder, Difficulty walking, Hemiparesis non-dominant, Impaired sensation, Muscle weakness, and Pain in L hand Rehab Potential: Good ELOS: 5-7 days   Today's Date: 03/22/2024 PT Individual Time: 1250-1345 PT Individual Time Calculation (min): 55 min Today's Date: 03/22/2024 PT Missed Time: 20 Minutes Missed Time Reason: Patient fatigue   Hospital Problem: Principal Problem:   Cardioembolic stroke [facility])   Past Medical History:  Past Medical History:  Diagnosis Date   Anxiety    TIA (transient ischemic attack)    Past Surgical History:  Past Surgical History:  Procedure Laterality Date   HAND SURGERY Left    HEMORRHOID SURGERY     TONSILLECTOMY AND ADENOIDECTOMY      Assessment & Plan Clinical Impression: Patient is a 89 y.o. 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  Of note, after patient's hospitalization and course in 08/2023 (CIR Dr.Yuri)  after his last TIA/CVA was discharged home on a Zio patch due to concerns for cryptogenic stroke.  Zio patch did not reveal any evidence of arrhythmia.  He was offered a loop recorder on follow-up cardiology services and declined.  Per chart review patient lives at [facility] independent living facility since November 2024.  Facility provides meals and his children provide transportation to the grocery store and appointments.  Modified dependent with rolling walker.  Presented 03/17/2024 with acute onset left-sided weakness and altered mental status.  He did not receive tPA.  CT/MRI showed 2 subcentimeter acute infarcts, 1 within the anterior right frontal lobe periventricular white matter and the other within the left periatrial white matter.  Background  parenchymal atrophy with chronic small vessel ischemic disease noted.  CTA with no emergent large vessel occlusion.  Admission chemistries unremarkable except glucose 123, BUN 26.  Echocardiogram with ejection fraction of 60 to 65% no wall motion abnormalities grade 1 diastolic dysfunction.  Neurology follow-up Dr. Armanda Magic currently maintained on monotherapy of Plavix for CVA prophylaxis.  Lovenox for DVT prophylaxis.  Tolerating mechanical soft diet.  Therapy evaluations completed due to patient decreased functional mobility and left-sided weakness was admitted for a comprehensive rehab program.   Patient currently requires min with mobility secondary to muscle weakness, decreased cardiorespiratoy endurance, impaired timing and sequencing and decreased coordination, decreased memory, and decreased standing balance, hemiplegia, and decreased balance strategies.  Prior to hospitalization, patient was modified independent  with mobility and lived with Spouse in a Independent living facility home.  Home access is  Level entry.  Patient will benefit from skilled PT intervention to maximize safe functional mobility, minimize fall risk, and decrease caregiver burden for planned discharge home with intermittent assist.  Anticipate patient will benefit from follow up [facility] at discharge.  PT - End of Session Activity Tolerance: Tolerates 30+ min activity with multiple rests Endurance Deficit: Yes Endurance Deficit Description: required rest breaks in between tasks due to fatigue PT Assessment Rehab Potential (ACUTE/IP ONLY): Good PT Barriers to Discharge: Other (comments) PT Barriers to Discharge Comments: occasional pain, decreased balance due to 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, global weakness/deconditioning PT Patient demonstrates impairments in the following area(s): Balance;Endurance;Motor;Pain;Sensory;Skin Integrity PT Transfers Functional Problem(s): Bed Mobility;Bed to Chair;Car;Furniture PT Locomotion  Functional Problem(s): Ambulation;Wheelchair Mobility;Stairs PT Plan PT Intensity: Minimum of 1-2 x/day ,45 to 90 minutes PT Frequency:  5 out of 7 days PT Duration Estimated Length of Stay: 5-7 days PT Treatment/Interventions: Ambulation/gait training;Discharge planning;Functional mobility training;Psychosocial support;Therapeutic Activities;Visual/perceptual remediation/compensation;Balance/vestibular training;Disease management/prevention;Neuromuscular re-education;Skin care/wound management;Therapeutic Exercise;Wheelchair propulsion/positioning;Cognitive remediation/compensation;DME/adaptive equipment instruction;Pain management;Splinting/orthotics;UE/LE Strength taining/ROM;Community reintegration;Patient/family education;Stair training;UE/LE Coordination activities;Functional electrical stimulation PT Transfers Anticipated Outcome(s): Mod I with LRAD PT Locomotion Anticipated Outcome(s): Mod I with LRAD PT Recommendation Follow Up Recommendations: Home health PT (at ILF) Patient destination: Home (ILF) Equipment Recommended: To be determined Equipment Details: has rollator   PT Evaluation Precautions/Restrictions Precautions Precautions: Fall Precaution/Restrictions Comments: new L hemi, old CVA with mild R hemi Restrictions Weight Bearing Restrictions Per Provider Order: No Pain Interference Pain Interference Pain Effect on Sleep: 2. Occasionally Pain Interference with Therapy Activities: 1. Rarely or not at all Pain Interference with Day-to-Day Activities: 1. Rarely or not at all Home Living/Prior Functioning Home Living Available Help at Discharge: Family;Available 24 hours/day Type of Home: Independent living facility Home Access: Level entry Home Layout: One level Bathroom Shower/Tub: Walk-in shower;Curtain Bathroom Toilet: Handicapped height Bathroom Accessibility: Yes Additional Comments: Moved to [facility] back in 2024. Has a rollator and TTB and was mod I with  rollator prior to admission  Lives With: Spouse Prior Function Level of Independence: Requires assistive device for independence;Independent with basic ADLs  Able to Take Stairs?: No Driving: No (stopped driving back in [id number] after stroke. kids drive both him and wife) Vocation: Retired Vision/Perception  Vision - History Ability to See in Adequate Light: 0 Adequate Perception Perception: Within Functional Limits Praxis Praxis: WFL  Cognition Overall Cognitive Status: Within Functional Limits for tasks assessed Arousal/Alertness: Awake/alert Orientation Level: Oriented X4 Year: 2025 Month: April Day of Week: Correct Attention: Sustained;Selective Sustained Attention: Appears intact Selective Attention: Impaired Selective Attention Impairment: Verbal complex;Functional complex Memory: Impaired Memory Impairment: Storage deficit;Retrieval deficit;Decreased recall of new information Awareness: Appears intact Problem Solving: Appears intact Executive Function: Organizing;Self Monitoring;Self Correcting Organizing: Appears intact Self Monitoring: Impaired Self Monitoring Impairment: Functional complex Self Correcting: Impaired Self Correcting Impairment: Functional complex Safety/Judgment: Appears intact Sensation Sensation Light Touch: Impaired by gross assessment Hot/Cold: Not tested Proprioception: Appears Intact Stereognosis: Not tested Additional Comments: decreased sensation LLE>RLE specifically L ankle and below. Pt reports numbness in bilateral feet and "tingling" with occasional pain in L hand. Coordination Gross Motor Movements are Fluid and Coordinated: No Fine Motor Movements are Fluid and Coordinated: No Coordination and Movement Description: grossly uncoordinated due to mild L hemiparesis, old CVA with mild R hemiparesis, generalized weakness/deconditioning, and fatigue Finger Nose Finger Test: Mild impairment LUE Heel Shin Test: slow bilaterally Motor   Motor Motor: Hemiplegia Motor - Skilled Clinical Observations: new mild L hemiparesis, old CVA with mild R hemiparesis  Trunk/Postural Assessment  Cervical Assessment Cervical Assessment: Exceptions to [facility] (mild forward head) Thoracic Assessment Thoracic Assessment: Exceptions to [facility] (mild thoracic rounding) Lumbar Assessment Lumbar Assessment: Within Functional Limits Postural Control Postural Control: Deficits on evaluation Righting Reactions: slightly delayed on L>R Protective Responses: slightly delayed on L>R  Balance Balance Balance Assessed: Yes Static Sitting Balance Static Sitting - Balance Support: Feet supported;Bilateral upper extremity supported Static Sitting - Level of Assistance: 6: Modified independent (Device/Increase time) Dynamic Sitting Balance Dynamic Sitting - Balance Support: Feet supported;No upper extremity supported Dynamic Sitting - Level of Assistance: 5: Stand by assistance (distant supervision) Static Standing Balance Static Standing - Balance Support: Bilateral upper extremity supported;During functional activity (rollator) Static Standing - Level of Assistance: 5: Stand by assistance (CGA) Dynamic Standing Balance Dynamic Standing - Balance Support: Bilateral  upper extremity supported;During functional activity (rollator) Dynamic Standing - Level of Assistance: 4: Min assist Dynamic Standing - Comments: with gait Extremity Assessment  RLE Assessment RLE Assessment: Exceptions to [facility] Strength Comments: tested sitting in recliner RLE Strength Right Hip Flexion: 4-/5 Right Hip ABduction: 4/5 Right Hip ADduction: 4/5 Right Knee Flexion: 4-/5 Right Knee Extension: 4-/5 Right Ankle Dorsiflexion: 4/5 Right Ankle Plantar Flexion: 4/5 LLE Assessment LLE Assessment: Exceptions to [facility] Strength Comments: tested sitting in recliner LLE Strength Left Hip Flexion: 4-/5 Left Hip ABduction: 4-/5 Left Hip ADduction: 4-/5 Left Knee  Flexion: 4-/5 Left Knee Extension: 4-/5 Left Ankle Dorsiflexion: 4-/5 Left Ankle Plantar Flexion: 4-/5  Care Tool Care Tool Bed Mobility Roll left and right activity   Roll left and right assist level: Supervision/Verbal cueing    Sit to lying activity   Sit to lying assist level: Supervision/Verbal cueing    Lying to sitting on side of bed activity   Lying to sitting on side of bed assist level: the ability to move from lying on the back to sitting on the side of the bed with no back support.: Minimal Assistance - Patient > 75%     Care Tool Transfers Sit to stand transfer   Sit to stand assist level: Contact Guard/Touching assist    Chair/bed transfer   Chair/bed transfer assist level: Contact Guard/Touching Paediatric nurse transfer activity did not occur: Refused (fatigue)        Care Tool Locomotion Ambulation   Assist level: Minimal Assistance - Patient > 75% Assistive device: Rollator Max distance: 146ft  Walk 10 feet activity   Assist level: Minimal Assistance - Patient > 75% Assistive device: Rollator   Walk 50 feet with 2 turns activity   Assist level: Minimal Assistance - Patient > 75% Assistive device: Rollator  Walk 150 feet activity Walk 150 feet activity did not occur: Safety/medical concerns (fatigue)      Walk 10 feet on uneven surfaces activity Walk 10 feet on uneven surfaces activity did not occur: Safety/medical concerns (fatigue)      Stairs   Assist level: Minimal Assistance - Patient > 75% Stairs assistive device: 2 hand rails Max number of stairs: 8 (6in)  Walk up/down 1 step activity   Walk up/down 1 step (curb) assist level: Minimal Assistance - Patient > 75% Walk up/down 1 step or curb assistive device: 2 hand rails  Walk up/down 4 steps activity   Walk up/down 4 steps assist level: Minimal Assistance - Patient > 75% Walk up/down 4 steps assistive device: 2 hand rails  Walk up/down 12 steps activity Walk up/down 12 steps  activity did not occur: Safety/medical concerns (fatigue)      Pick up small objects from floor Pick up small object from the floor (from standing position) activity did not occur: Safety/medical concerns (decreased balance/coordination)      Wheelchair Is the patient using a wheelchair?: No   Wheelchair activity did not occur: N/A      Wheel 50 feet with 2 turns activity Wheelchair 50 feet with 2 turns activity did not occur: N/A    Wheel 150 feet activity Wheelchair 150 feet activity did not occur: N/A      Refer to Care Plan for Long Term Goals  SHORT TERM GOAL WEEK 1 PT Short Term Goal 1 (Week 1): STG=LTG due to LOS  Recommendations for other services: None   Skilled Therapeutic Intervention Evaluation completed (see details above and below)  with education on PT POC and goals and individual treatment initiated with focus on functional mobility/transfers, generalized strengthening and endurance, dynamic standing balance/coordination, stair navigation, and ambulation. Received pt sitting in recliner, pt educated on PT evaluation, CIR policies, and therapy schedule and agreeable. Pt denied any current pain but reported intermittent pain in L hand. Pt also reporting significant fatigue from not sleeping well last night and required frequent rest breaks throughout session.   Provided pt with rollator and pt performed all transfers with rollator and CGA throughout session with intermittent cues to lock brakes. Pt ambulated 173ft x 1 and 5ft x 1 with rollator and CGA/min A to main therapy gym with 1 seated rest break. Ambulated ~57ft with rollator and CGA to staircase and navigated 8 6in steps with bilateral handrails and min A ascending with a step through and descending with a step to pattern. Pt politely declined practicing car transfer, ultimately requesting to return to room due to fatigue. Ambulated 160ft with rollator and CGA/min A back to room and requested to return to bed.  Transferred sit<>supine with supervision and NT arrived to check vitals. Concluded session with pt semi-reclined in bed, needs within reach, and bed alarm on. Safety plan updated. 20 minutes missed of skilled physical therapy due to fatigue.   Mobility Bed Mobility Bed Mobility: Rolling Right;Rolling Left;Sit to Supine Rolling Right: Supervision/verbal cueing Rolling Left: Supervision/Verbal cueing Sit to Supine: Supervision/Verbal cueing Transfers Transfers: Sit to Stand;Stand to Sit;Stand Pivot Transfers Sit to Stand: Contact Guard/Touching assist Stand to Sit: Contact Guard/Touching assist Stand Pivot Transfers: Contact Guard/Touching assist Transfer (Assistive device): Rollator Locomotion  Gait Ambulation: Yes Gait Assistance: Minimal Assistance - Patient > 75% Gait Distance (Feet): 132 Feet Assistive device: Rollator Gait Assistance Details: Verbal cues for technique;Verbal cues for precautions/safety Gait Assistance Details: occasional cues to lock brakes Gait Gait: Yes Gait Pattern: Impaired Gait Pattern: Step-to pattern;Step-through pattern;Decreased step length - right;Decreased step length - left;Decreased stride length;Poor foot clearance - left;Poor foot clearance - right;Narrow base of support Gait velocity: decreased Stairs / Additional Locomotion Stairs: Yes Stairs Assistance: Minimal Assistance - Patient > 75% Stair Management Technique: Two rails;Alternating pattern;Step to pattern;Forwards Number of Stairs: 8 Height of Stairs: 6 Wheelchair Mobility Wheelchair Mobility: No   Discharge Criteria: Patient will be discharged from PT if patient refuses treatment 3 consecutive times without medical reason, if treatment goals not met, if there is a change in medical status, if patient makes no progress towards goals or if patient is discharged from hospital.  The above assessment, treatment plan, treatment alternatives and goals were discussed and mutually agreed  upon: by patient  Huntley Dec PT, DPT 03/22/2024, 2:04 PM

## 2024-03-22 NOTE — Progress Notes (Signed)
 Inpatient Rehabilitation Care Coordinator Assessment and Plan Patient Details  Name: James [name]. MRN: [id number] Date of Birth: 1934/10/16  Today's Date: 03/22/2024  Hospital Problems: Principal Problem:   Cardioembolic stroke [facility])  Past Medical History:  Past Medical History:  Diagnosis Date   Anxiety    TIA (transient ischemic attack)    Past Surgical History:  Past Surgical History:  Procedure Laterality Date   HAND SURGERY Left    HEMORRHOID SURGERY     TONSILLECTOMY AND ADENOIDECTOMY     Social History:  reports that he has quit smoking. His smoking use included cigarettes. He has never used smokeless tobacco. He reports that he does not currently use alcohol. He reports that he does not use drugs.  Family / Support Systems Marital Status: Married Patient Roles: Spouse, Parent Spouse/Significant OtherMelford Aase [phone] Children: Christopher-son [phone]  Margaret-daughter [phone] Elizabeth-daughter [phone] Other Supports: Facility Anticipated Caregiver: Self children to assist when not working and wife is there but can not assist due to own health issues Ability/Limitations of Caregiver: Children work, wife has health issues in remission from bladder cancer Caregiver Availability: 24/7 Family Dynamics: Close knit family children have been making sure their parents needs have been met. Couple live at ILF-[facility] since last 10/2023. Both like it and adjusted well according to daughter  Social History Preferred language: English Religion: Catholic Cultural Background: NA Education: Charity fundraiser - How often do you need to have someone help you when you read instructions, pamphlets, or other written material from your doctor or pharmacy?: Rarely Writes: Yes Employment Status: Retired Marine scientist Issues: NA Guardian/Conservator: None-according to MD pt is capable of making his own decisions but will include chidlren since  involved and pt in agreement with   Abuse/Neglect Abuse/Neglect Assessment Can Be Completed: Yes Physical Abuse: Denies Verbal Abuse: Denies Sexual Abuse: Denies Exploitation of patient/patient's resources: Denies Self-Neglect: Denies  Patient response to: Social Isolation - How often do you feel lonely or isolated from those around you?: Never  Emotional Status Pt's affect, behavior and adjustment status: Pt was here last year and was convinced to come back he wants to be at apartment with wife,but did participate in therapies. Pt was starting to receive therapies at ILF when had CVA.  He was using a rollator prior to admission and was mod/i Recent Psychosocial Issues: other health issues had done well after last CVA Psychiatric History: Hx-anxiety takes medications for this and finds it helpful Substance Abuse History: NA  Patient / Family Perceptions, Expectations & Goals Pt/Family understanding of illness & functional limitations: Pt and daughter can explain his stroke. Daughter feels this one is not as bad as the one last year. Both have spoken with the MD and feel understand the plan moving forward. Premorbid pt/family roles/activities: husband, father, grandfather, retiree, etc Anticipated changes in roles/activities/participation: resume Pt/family expectations/goals: Pt states: " I'm tired today."  Daughter states: " We hope he does well and gets back moving on his own."  [employer]: Other (Comment) (Resident of [facility]-ILF) Premorbid Home Care/DME Agencies: Other (Comment) (has rollator tub seat and grab bars in shower and bathroom) Transportation available at discharge: family Is the patient able to respond to transportation needs?: Yes In the past 12 months, has lack of transportation kept you from medical appointments or from getting medications?: No In the past 12 months, has lack of transportation kept you from meetings, work, or from  getting things needed for daily living?: No  Discharge Planning Living Arrangements: Spouse/significant other Support Systems: Spouse/significant other, Children, Friends/neighbors Type of Residence: Independent Living Insurance Resources: Media planner (specify) ([employer] Medicare) Financial Resources: Restaurant manager, fast food Screen Referred: No Living Expenses: Rent Money Management: Family, Spouse, Patient Does the patient have any problems obtaining your medications?: No Home Management: Has others provide meals and cleaning Patient/Family Preliminary Plans: Return home with wife in ILF apartment at [employer]. Pt was using a rollator prior to admission and has other pieces of equipment needed. He was here last year and is familar with the process Care Coordinator Barriers to Discharge: Insurance for SNF coverage Care Coordinator Anticipated Follow Up Needs: ALF/IL, HH/OP  Clinical Impression Pleasant tired gentleman who did a lot today in therapies. Also spoke with his daughter who reports the hope is he will be able to return to apartment with wife and be mobile. Will update after team conference tomorrow.  Lucy Chris 03/22/2024, 3:09 PM

## 2024-03-22 NOTE — Progress Notes (Signed)
 Patient ID: James [name]., male   DOB: 12-Nov-1934, 89 y.o.   MRN: [id number]  Left message for daughter-Margaret am awaiting return call. Pt doing well in his therapies today.

## 2024-03-22 NOTE — Progress Notes (Signed)
 Patient ID: James Current., male   DOB: [birth year], 89 y.o.   MRN: [id number] Met with the patient to review current medical situation (right frontal CVA with remote CVA 9/24) on monotherapy (Plavix).Reviewed secondary risk management including HTN, HLD (LDL44/Trig 64) on Lipitor with PAD. Patient reported has not slept well in a couple of nights; with xanax and feels congested. MD aware.  Reports son sets up pill boxes at home and would like to be able to eat Bran flakes for breakfast; currently maintained on a dysphagia 3 thin liquid diet.  Continue to monitor to address educational needs to facilitate preparation for discharge home to ILF [facility]) with wife. Reports sedentary since previous stroke (watches a lot of TV). James [name]

## 2024-03-22 NOTE — Plan of Care (Signed)
  Problem: RH Swallowing Goal: LTG Patient will consume least restrictive diet using compensatory strategies with assistance (SLP) Description: LTG:  Patient will consume least restrictive diet using compensatory strategies with assistance (SLP) Flowsheets (Taken 03/22/2024 1000) LTG: Pt Patient will consume least restrictive diet using compensatory strategies with assistance of (SLP): Modified Independent   Problem: RH Memory Goal: LTG Patient will demonstrate ability for day to day (SLP) Description: LTG:   Patient will demonstrate ability for day to day recall/carryover during cognitive/linguistic activities with assist  (SLP) Flowsheets (Taken 03/22/2024 1000) LTG: Patient will demonstrate ability for day to day recall: New information LTG: Patient will demonstrate ability for day to day recall/carryover during cognitive/linguistic activities with assist (SLP): Supervision

## 2024-03-22 NOTE — Evaluation (Signed)
 Occupational Therapy Assessment and Plan  Patient Details  Name: James [name]. MRN: [id number] Date of Birth: [birth year]  OT Diagnosis: abnormal posture, cognitive deficits, and muscle weakness (generalized) Rehab Potential: Rehab Potential (ACUTE ONLY): Good ELOS: 7-10 days   Today's Date: 03/22/2024 OT Individual Time: 4098-1191 OT Individual Time Calculation (min): 70 min     Hospital Problem: Principal Problem:   Cardioembolic stroke [facility])   Past Medical History:  Past Medical History:  Diagnosis Date   Anxiety    TIA (transient ischemic attack)    Past Surgical History:  Past Surgical History:  Procedure Laterality Date   HAND SURGERY Left    HEMORRHOID SURGERY     TONSILLECTOMY AND ADENOIDECTOMY      Assessment & Plan Clinical Impression:  Lorena Clearman is an 89 year old right-handed male with history of anxiety/TIA, tobacco use peripheral artery disease, 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  Of note, after patient's hospitalization and course in 08/2023 (CIR Dr.Yuri)  after his last TIA/CVA was discharged home on a Zio patch due to concerns for cryptogenic stroke.  Zio patch did not reveal any evidence of arrhythmia.  He was offered a loop recorder on follow-up cardiology services and declined.  Per chart review patient lives at [facility] facility since November 2024.  Facility provides meals and his children provide transportation to the grocery store and appointments.  Modified dependent with rolling walker.  Presented 03/17/2024 with acute onset left-sided weakness and altered mental status.  He did not receive tPA.  CT/MRI showed 2 subcentimeter acute infarcts, 1 within the anterior right frontal lobe periventricular white matter and the other within the left periatrial white matter.  Background parenchymal atrophy with chronic small vessel ischemic disease noted.  CTA with no emergent large vessel occlusion.  Admission chemistries  unremarkable except glucose 123, BUN 26.  Echocardiogram with ejection fraction of 60 to 65% no wall motion abnormalities grade 1 diastolic dysfunction.  Neurology follow-up Dr. Armanda Magic currently maintained on monotherapy of Plavix for CVA prophylaxis.  Lovenox for DVT prophylaxis.  Tolerating mechanical soft diet.  Therapy evaluations completed due to patient decreased functional mobility and left-sided weakness was admitted for a comprehensive rehab program. Patient transferred to CIR on 03/21/2024 .    Patient currently requires min with basic self-care skills secondary to muscle weakness, decreased cardiorespiratoy endurance, decreased coordination, decreased memory, and decreased standing balance.  Prior to hospitalization, patient could complete self care with mod I.  Patient will benefit from skilled intervention to increase independence with basic self-care skills and increase level of independence with iADL prior to discharge home with care partner.  Anticipate patient will require intermittent supervision and follow up outpatient.  OT - End of Session Activity Tolerance: Tolerates 10 - 20 min activity with multiple rests Endurance Deficit: Yes Endurance Deficit Description: Verbalized fatigue with BADLs OT Assessment Rehab Potential (ACUTE ONLY): Good OT Barriers to Discharge: Lack of/limited family support OT Patient demonstrates impairments in the following area(s): Balance;Cognition;Endurance;Motor;Sensory OT Basic ADL's Functional Problem(s): Bathing;Dressing;Toileting OT Advanced ADL's Functional Problem(s): Simple Meal Preparation OT Transfers Functional Problem(s): Toilet;Tub/Shower OT Additional Impairment(s): None OT Plan OT Intensity: Minimum of 1-2 x/day, 45 to 90 minutes OT Frequency: 5 out of 7 days OT Duration/Estimated Length of Stay: 7-10 days OT Treatment/Interventions: Balance/vestibular training;Discharge planning;Pain management;Self Care/advanced ADL  retraining;Therapeutic Activities;UE/LE Coordination activities;Cognitive remediation/compensation;Disease mangement/prevention;Functional mobility training;Patient/family education;Therapeutic Exercise;DME/adaptive equipment instruction;Neuromuscular re-education;UE/LE Strength taining/ROM OT Self Feeding Anticipated Outcome(s): Independent OT Basic Self-Care Anticipated Outcome(s):  Supervision - mod I OT Toileting Anticipated Outcome(s): Supervision OT Bathroom Transfers Anticipated Outcome(s): Supervision OT Recommendation Recommendations for Other Services: Therapeutic Recreation consult Therapeutic Recreation Interventions: Pet therapy Patient destination: Home Follow Up Recommendations: Outpatient OT Equipment Recommended: To be determined   OT Evaluation Precautions/Restrictions  Precautions Precautions: Fall Precaution/Restrictions Comments: new L hemi, old CVA with mild R hemi Restrictions Weight Bearing Restrictions Per Provider Order: No Home Living/Prior Functioning Home Living Available Help at Discharge: Family, Available 24 hours/day Type of Home: Independent living facility Home Access: Level entry Home Layout: One level Bathroom Shower/Tub: Walk-in shower, Curtain Bathroom Toilet: Handicapped height Bathroom Accessibility: Yes Additional Comments: Moved to [facility] back in 2024. Has a rollator, TTB  Lives With: Spouse IADL History Homemaking Responsibilities: Yes Meal Prep Responsibility: Primary Current License: Yes Education: some college Occupation: Retired Type of Occupation: Retired Quarry manager Leisure and Hobbies: Loves traveling Prior Function Level of Independence: Requires assistive device for independence, Independent with basic ADLs Driving: No (stopped driving back in 4098 after stroke. kids drive both him and wif) Vocation: Retired Administrator, sports Baseline Vision/History: 1 Wears glasses Ability to See in Adequate Light: 0 Adequate Patient  Visual Report: No change from baseline Vision Assessment?: No apparent visual deficits;Wears glasses for reading Perception  Perception: Within Functional Limits Praxis Praxis: WFL Cognition Cognition Overall Cognitive Status: Within Functional Limits for tasks assessed Arousal/Alertness: Awake/alert Orientation Level: Person;Place;Situation Person: Oriented Place: Oriented Situation: Oriented Memory: Impaired Memory Impairment: Storage deficit;Retrieval deficit;Decreased recall of new information Attention: Sustained;Selective Sustained Attention: Appears intact Selective Attention: Impaired Selective Attention Impairment: Verbal complex;Functional complex Awareness: Appears intact Problem Solving: Appears intact Executive Function: Organizing;Self Monitoring;Self Correcting Organizing: Appears intact Self Monitoring: Impaired Self Monitoring Impairment: Functional complex Self Correcting: Impaired Self Correcting Impairment: Functional complex Safety/Judgment: Appears intact Brief Interview for Mental Status (BIMS) Repetition of Three Words (First Attempt): 3 Temporal Orientation: Year: Correct Temporal Orientation: Month: Accurate within 5 days Temporal Orientation: Day: Correct Recall: "Sock": Yes, no cue required Recall: "Blue": Yes, no cue required Recall: "Bed": No, could not recall BIMS Summary Score: 13 Sensation Sensation Light Touch: Impaired by gross assessment Hot/Cold: Not tested Proprioception: Appears Intact Stereognosis: Not tested Additional Comments: Reports numbness in BLE (LLE > RLE) and BUE (LUE > RUE) Coordination Gross Motor Movements are Fluid and Coordinated: No Fine Motor Movements are Fluid and Coordinated: No Finger Nose Finger Test: Mild impairment LUE Motor  Motor Motor: Hemiplegia Motor - Skilled Clinical Observations: new mild L hemiparesis, old CVA with mild R hemiparesis  Trunk/Postural Assessment  Cervical Assessment Cervical  Assessment: Exceptions to [facility] (mild forward head) Thoracic Assessment Thoracic Assessment: Exceptions to [facility] (mild thoracic rounding) Lumbar Assessment Lumbar Assessment: Within Functional Limits Postural Control Postural Control: Deficits on evaluation Righting Reactions: slightly delayed on L>R Protective Responses: slightly delayed on L>R  Balance Balance Balance Assessed: Yes Static Sitting Balance Static Sitting - Balance Support: Feet supported;Bilateral upper extremity supported Static Sitting - Level of Assistance: 6: Modified independent (Device/Increase time) Dynamic Sitting Balance Dynamic Sitting - Balance Support: Feet supported;No upper extremity supported Dynamic Sitting - Level of Assistance: 5: Stand by assistance (distant supervision) Static Standing Balance Static Standing - Balance Support: Bilateral upper extremity supported;During functional activity (rollator) Static Standing - Level of Assistance: 5: Stand by assistance (CGA) Dynamic Standing Balance Dynamic Standing - Balance Support: Bilateral upper extremity supported;During functional activity (rollator) Dynamic Standing - Level of Assistance: 4: Min assist Dynamic Standing - Comments: with gait Extremity/Trunk Assessment RUE Assessment RUE Assessment:  Within Functional Limits General Strength Comments: 4-/5 triceps LUE Assessment LUE Assessment: Within Functional Limits General Strength Comments: 4-/5 triceps  Care Tool Care Tool Self Care Eating   Eating Assist Level: Set up assist    Oral Care    Oral Care Assist Level: Set up assist    Bathing   Body parts bathed by patient: Right arm;Left arm;Chest;Abdomen;Front perineal area;Buttocks;Right upper leg;Left upper leg;Right lower leg;Left lower leg;Face     Assist Level: Contact Guard/Touching assist    Upper Body Dressing(including orthotics)   What is the patient wearing?: Pull over shirt   Assist Level: Supervision/Verbal cueing     Lower Body Dressing (excluding footwear)   What is the patient wearing?: Underwear/pull up;Pants Assist for lower body dressing: Contact Guard/Touching assist    Putting on/Taking off footwear   What is the patient wearing?: Non-skid slipper socks Assist for footwear: Minimal Assistance - Patient > 75%       Care Tool Toileting Toileting activity   Assist for toileting: Minimal Assistance - Patient > 75%     Care Tool Bed Mobility Roll left and right activity        Sit to lying activity        Lying to sitting on side of bed activity   Lying to sitting on side of bed assist level: the ability to move from lying on the back to sitting on the side of the bed with no back support.: Minimal Assistance - Patient > 75%     Care Tool Transfers Sit to stand transfer   Sit to stand assist level: Contact Guard/Touching assist    Chair/bed transfer         Toilet transfer   Assist Level: Minimal Assistance - Patient > 75%     Care Tool Cognition  Expression of Ideas and Wants Expression of Ideas and Wants: 4. Without difficulty (complex and basic) - expresses complex messages without difficulty and with speech that is clear and easy to understand  Understanding Verbal and Non-Verbal Content Understanding Verbal and Non-Verbal Content: 4. Understands (complex and basic) - clear comprehension without cues or repetitions   Memory/Recall Ability Memory/Recall Ability : Current season;That he or she is in a hospital/hospital unit   Refer to Care Plan for Long Term Goals  SHORT TERM GOAL WEEK 1 OT Short Term Goal 1 (Week 1): STG = LTG due to ELOS  Recommendations for other services: Therapeutic Recreation  Pet therapy   Skilled Therapeutic Intervention Patient received upright in bed upon therapy arrival and agreeable to participate in OT evaluation. Education provided on OT purpose, therapy schedule, goals for therapy, and safety policy while in rehab. No pain reported. OT did  observe ring shaped red spot on LUQ and over sternal region with reported new red bumps on abdomen; nursing and PA present to assess suspecting skin irritation from EKG leads.  Patient demonstrates mild cognitive impairments (memory, sequencing), dynamic standing balance with retropulsion without AD, functional endurance and GM coordination deficits resulting in difficulty completing BADL tasks without increased physical assist. Pt with baseline L sided numbness from previous CVA however new reported sensation changes on R side with RLE being worse than RUE. Cues needed throughout for sequencing and task progression. Pt will benefit from skilled OT services to focus on mentioned deficits. See below for ADL and functional transfer performance. ADLs completed at sink side for sponge bathing and dressing. Ambulated with RW with CGA/min A with no observed BLE buckling noted. Pt  remained seated in recliner at conclusion of session with chair alarm on and all needs met at end of session.    ADL ADL Eating: Set up Where Assessed-Eating: Wheelchair Grooming: Setup Where Assessed-Grooming: Sitting at sink Upper Body Bathing: Supervision/safety Where Assessed-Upper Body Bathing: Sitting at sink Lower Body Bathing: Contact guard Where Assessed-Lower Body Bathing: Sitting at sink;Standing at sink Upper Body Dressing: Supervision/safety Where Assessed-Upper Body Dressing: Sitting at sink Lower Body Dressing: Contact guard Where Assessed-Lower Body Dressing: Sitting at sink;Standing at sink Toileting: Contact guard Where Assessed-Toileting: Teacher, adult education: Curator Method: Proofreader: Raised toilet seat Tub/Shower Transfer: Unable to assess Tub/Shower Transfer Method: Unable to assess [employer] Transfer: Unable to assess [employer] Transfer Method: Unable to assess Mobility  Bed Mobility Bed Mobility: Rolling Right;Rolling Left;Sit  to Supine Rolling Right: Supervision/verbal cueing Rolling Left: Supervision/Verbal cueing Sit to Supine: Supervision/Verbal cueing Transfers Sit to Stand: Contact Guard/Touching assist Stand to Sit: Contact Guard/Touching assist   Discharge Criteria: Patient will be discharged from OT if patient refuses treatment 3 consecutive times without medical reason, if treatment goals not met, if there is a change in medical status, if patient makes no progress towards goals or if patient is discharged from hospital.  The above assessment, treatment plan, treatment alternatives and goals were discussed and mutually agreed upon: by patient  Melvyn Novas, MS, OTR/L  03/22/2024, 12:16 PM

## 2024-03-22 NOTE — Plan of Care (Signed)
  Problem: RH Balance Goal: LTG Patient will maintain dynamic standing with ADLs (OT) Description: LTG:  Patient will maintain dynamic standing balance with assist during activities of daily living (OT)  Flowsheets (Taken 03/22/2024 1228) LTG: Pt will maintain dynamic standing balance during ADLs with: Independent with assistive device   Problem: Sit to Stand Goal: LTG:  Patient will perform sit to stand in prep for activites of daily living with assistance level (OT) Description: LTG:  Patient will perform sit to stand in prep for activites of daily living with assistance level (OT) Flowsheets (Taken 03/22/2024 1228) LTG: PT will perform sit to stand in prep for activites of daily living with assistance level: Independent with assistive device   Problem: RH Bathing Goal: LTG Patient will bathe all body parts with assist levels (OT) Description: LTG: Patient will bathe all body parts with assist levels (OT) Flowsheets (Taken 03/22/2024 1228) LTG: Pt will perform bathing with assistance level/cueing: Supervision/Verbal cueing   Problem: RH Dressing Goal: LTG Patient will perform upper body dressing (OT) Description: LTG Patient will perform upper body dressing with assist, with/without cues (OT). Flowsheets (Taken 03/22/2024 1228) LTG: Pt will perform upper body dressing with assistance level of: Set up assist Goal: LTG Patient will perform lower body dressing w/assist (OT) Description: LTG: Patient will perform lower body dressing with assist, with/without cues in positioning using equipment (OT) Flowsheets (Taken 03/22/2024 1228) LTG: Pt will perform lower body dressing with assistance level of: Set up assist   Problem: RH Toileting Goal: LTG Patient will perform toileting task (3/3 steps) with assistance level (OT) Description: LTG: Patient will perform toileting task (3/3 steps) with assistance level (OT)  Flowsheets (Taken 03/22/2024 1228) LTG: Pt will perform toileting task (3/3 steps) with  assistance level: Supervision/Verbal cueing   Problem: RH Simple Meal Prep Goal: LTG Patient will perform simple meal prep w/assist (OT) Description: LTG: Patient will perform simple meal prep with assistance, with/without cues (OT). Flowsheets (Taken 03/22/2024 1228) LTG: Pt will perform simple meal prep with assistance level of: Supervision/Verbal cueing   Problem: RH Toilet Transfers Goal: LTG Patient will perform toilet transfers w/assist (OT) Description: LTG: Patient will perform toilet transfers with assist, with/without cues using equipment (OT) Flowsheets (Taken 03/22/2024 1228) LTG: Pt will perform toilet transfers with assistance level of: Independent with assistive device   Problem: RH Tub/Shower Transfers Goal: LTG Patient will perform tub/shower transfers w/assist (OT) Description: LTG: Patient will perform tub/shower transfers with assist, with/without cues using equipment (OT) Flowsheets (Taken 03/22/2024 1228) LTG: Pt will perform tub/shower stall transfers with assistance level of: Supervision/Verbal cueing   Problem: RH Memory Goal: LTG Patient will demonstrate ability for day to day recall/carry over during activities of daily living with assistance level (OT) Description: LTG:  Patient will demonstrate ability for day to day recall/carry over during activities of daily living with assistance level (OT). Flowsheets (Taken 03/22/2024 1228) LTG:  Patient will demonstrate ability for day to day recall/carry over during activities of daily living with assistance level (OT): Supervision

## 2024-03-22 NOTE — Progress Notes (Signed)
 Patient complaining that he didn't sleep well at all. Patient would like for lab to come in later in the mornings so he could sleep in.   Echo [place]

## 2024-03-22 NOTE — Evaluation (Signed)
 Speech Language Pathology Assessment and Plan  Patient Details  Name: James [name]. MRN: [id number] Date of Birth: 01-20-1934  SLP Diagnosis: Cognitive Impairments;Dysphagia  Rehab Potential: Good ELOS: ~2 weeks    Today's Date: 03/22/2024 SLP Individual Time: 0805-0900 SLP Individual Time Calculation (min): 55 min   Hospital Problem: Principal Problem:   Cardioembolic stroke [facility])  Past Medical History:  Past Medical History:  Diagnosis Date   Anxiety    TIA (transient ischemic attack)    Past Surgical History:  Past Surgical History:  Procedure Laterality Date   HAND SURGERY Left    HEMORRHOID SURGERY     TONSILLECTOMY AND ADENOIDECTOMY      Assessment / Plan / Recommendation Clinical Impression Pt is an 89 year old male with medical hx significant for: anxiety disorder, PAD, descending aortic aneurysm, 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 Pt presented to [facility] on 03/17/24 d/t new onset of right-sided weakness. MRI revealed 2 subcentimeter acute infarcts one within the anterior right frontal lobe periventricular white matter and the other within the left periatrial white matter. Pt not a candidate for tPA. CTA negative for LVO. Therapy evaluations completed and CIR recommended d/t pt's deficits in functional mobility.   Swallowing: SLP conducted bedside swallow evaluation with patient consuming Dys3/thin liquids from breakfast tray. Patient with history of mild-moderate oropharyngeal dysphagia per MBS conducted 09/2023, with recommendations for use of 10cc Provale cup, however per patient report, he no longer utilizes Provale cup at home and chest imaging was clear on admission. Dys3 solids are patient's baseline per chart review. Across all solid textures, patient exhibited no overt difficulty. With thin liquids, patient exhibited immediate cough x1 with small sip of coffee while not in optimal position. SLP assisted with repositioning to 90  degrees. In optimal position, patient exhibited no overt s/sx of penetration/aspiration. RN administered medications whole with thin liquids during session with no overt difficulty observed. Recommend continuation of Dys3/thin liquid diet. Ensure optimal positioning during any PO intake, even between meals. Administer medications whole with thin liquids.   Cognitive-Linguistic: SLP conducted SLUMS examination with patient scoring 16/30, demonstrating deficits in the areas of immediate memory, delayed recall, visuospatial organization and selective attention. Patient endorses internal distractions and very poor sleep which likely impacted performance. Strengths exhibited in the areas of problem solving and working memory. Receptive/expressive language and motor speech appear to be [facility]. Patient is independent at baseline and manages all iADLs, thus would benefit from ST services to ensure adequate ability to complete these tasks.  Patient would benefit from continued ST services to target all aforementioned deficits. Patient was left in lowered bed with call bell in reach and bed alarm set. SLP will continue to target goals per plan of care.     Skilled Therapeutic Interventions          SLP conducted skilled evaluation to assess swallowing and cognitive-linguistic function utilizing the above mentioned assessment items. See results above.   SLP Assessment  Patient will need skilled Speech Lanaguage Pathology Services during CIR admission    Recommendations  SLP Diet Recommendations: Dysphagia 3 (Mech soft);Thin Liquid Administration via: Spoon;Straw Medication Administration: Whole meds with liquid Supervision: Patient able to self feed Compensations: Slow rate;Small sips/bites Postural Changes and/or Swallow Maneuvers: Seated upright 90 degrees Oral Care Recommendations: Oral care BID Patient destination: Home Follow up Recommendations: None Equipment Recommended: None recommended by SLP     SLP Frequency 1 to 3 out of 7 days   SLP Duration  SLP Intensity  SLP Treatment/Interventions ~2 weeks  Minumum of 1-2 x/day, 30 to 90 minutes  Cognitive remediation/compensation;Functional tasks;Cueing hierarchy;Patient/family education;Dysphagia/aspiration precaution training;Therapeutic Activities    Pain Pain Assessment Pain Scale: 0-10  Prior Functioning Cognitive/Linguistic Baseline: Baseline deficits Baseline deficit details: sounds like he is pretty functional at home, did discharge previously from CIR with recommendations for supervision with cognitive tasks Type of Home: Independent living facility  Lives With: Spouse Available Help at Discharge: Family;Available 24 hours/day Education: some college Vocation: Retired  Architectural technologist Overall Cognitive Status: Impaired/Different from baseline Arousal/Alertness: Awake/alert Orientation Level: Oriented X4 Year: 2025 Month: April Day of Week: Correct Attention: Sustained;Selective Sustained Attention: Appears intact Selective Attention: Impaired Selective Attention Impairment: Verbal complex;Functional complex Memory: Impaired Memory Impairment: Storage deficit;Retrieval deficit;Decreased recall of new information Awareness: Appears intact Problem Solving: Appears intact Executive Function: Organizing;Self Monitoring;Self Correcting Organizing: Appears intact Self Monitoring: Impaired Self Monitoring Impairment: Functional complex Self Correcting: Impaired Self Correcting Impairment: Functional complex Safety/Judgment: Appears intact  Comprehension Auditory Comprehension Overall Auditory Comprehension: Appears within functional limits for tasks assessed Expression Expression Primary Mode of Expression: Verbal Verbal Expression Overall Verbal Expression: Appears within functional limits for tasks assessed Written Expression Dominant Hand: Right Oral Motor Oral Motor/Sensory Function Overall  Oral Motor/Sensory Function: Within functional limits Motor Speech Overall Motor Speech: Appears within functional limits for tasks assessed Intelligibility: Intelligible  Care Tool Care Tool Cognition Ability to hear (with hearing aid or hearing appliances if normally used Ability to hear (with hearing aid or hearing appliances if normally used): 0. Adequate - no difficulty in normal conservation, social interaction, listening to TV   Expression of Ideas and Wants Expression of Ideas and Wants: 4. Without difficulty (complex and basic) - expresses complex messages without difficulty and with speech that is clear and easy to understand   Understanding Verbal and Non-Verbal Content Understanding Verbal and Non-Verbal Content: 4. Understands (complex and basic) - clear comprehension without cues or repetitions  Memory/Recall Ability Memory/Recall Ability : Current season;That he or she is in a hospital/hospital unit   Intelligibility: Intelligible  Bedside Swallowing Assessment General Date of Onset: 03/21/24 Previous Swallow Assessment: see impressions Diet Prior to this Study: Dysphagia 3 (mechanical soft);Thin liquids (Level 0) Temperature Spikes Noted: No Respiratory Status: Room air History of Recent Intubation: No Behavior/Cognition: Alert;Cooperative;Pleasant mood Oral Cavity - Dentition: Adequate natural dentition Self-Feeding Abilities: Able to feed self Vision: Functional for self-feeding Patient Positioning: Upright in bed Baseline Vocal Quality: Normal Volitional Cough: Strong Volitional Swallow: Able to elicit  Oral Care Assessment   Ice Chips Ice chips: Not tested Thin Liquid Thin Liquid: Impaired Presentation: Self Fed;Straw Pharyngeal  Phase Impairments: Cough - Immediate Other Comments: cough only occurred when patient was semi-reclined Nectar Thick Nectar Thick Liquid: Not tested Honey Thick Honey Thick Liquid: Not tested Puree Puree: Within functional  limits Presentation: Self Fed;Spoon Solid Solid: Impaired Presentation: Self Fed Oral Phase Impairments: Impaired mastication Oral Phase Functional Implications: Impaired mastication BSE Assessment Risk for Aspiration Impact on safety and function: Mild aspiration risk Other Related Risk Factors: History of dysphagia;Previous CVA  Short Term Goals: Week 1: SLP Short Term Goal 1 (Week 1): Patient will tolerate Dys3/thin liquid diet without overt s/sx of penetration/aspiration given supervision cues for optimal positioning and safe swallowing strategies. SLP Short Term Goal 2 (Week 1): Patient will utilize internal/external memory strategies to recall new information with 80% accuracy give min verbal cues. SLP Short Term Goal 3 (Week 1): Patient will selectively attend to verbally  presented information during memory tasks and utilize strategies for recall as appropriate given min verbal cues.  Refer to Care Plan for Long Term Goals  Recommendations for other services: None   Discharge Criteria: Patient will be discharged from SLP if patient refuses treatment 3 consecutive times without medical reason, if treatment goals not met, if there is a change in medical status, if patient makes no progress towards goals or if patient is discharged from hospital.  The above assessment, treatment plan, treatment alternatives and goals were discussed and mutually agreed upon: by patient  Jeannie Done, M.A., CCC-SLP  Yetta Barre 03/22/2024, 11:17 AM

## 2024-03-22 NOTE — Progress Notes (Signed)
 PROGRESS NOTE   Subjective/Complaints: C/o insomnia: melatonin added, he takes alprazolam at home, ordered prn here and received 1mg  at midnight- d/ced this order and scheduled 0.25mg  at bedtime  ROS: +insomna   Objective:   No results found. Recent Labs    03/21/24 1355 03/22/24 0457  WBC 5.2 5.7  HGB 12.6* 12.6*  HCT 38.7* 38.4*  PLT 145* 157   Recent Labs    03/21/24 0505 03/21/24 1355 03/22/24 0457  NA 139  --  138  K 3.6  --  3.7  CL 108  --  108  CO2 24  --  22  GLUCOSE 93  --  88  BUN 27*  --  23  CREATININE 1.15 1.17 1.29*  CALCIUM 9.0  --  9.1    Intake/Output Summary (Last 24 hours) at 03/22/2024 0947 Last data filed at 03/22/2024 1610 Gross per 24 hour  Intake --  Output 500 ml  Net -500 ml        Physical Exam: Vital Signs There were no vitals taken for this visit. Gen: no distress, normal appearing HEENT: oral mucosa pink and moist, NCAT Cardio: Reg rate Chest: normal effort, normal rate of breathing Abd: soft, non-distended Ext: no edema Psych: pleasant, normal affect Skin: intact Neurologic: Cranial nerves II through XII intact, motor strength is 5-/5 in bilateral deltoid, bicep, tricep, grip, 5/5 right and 4/5 left hip flexor, knee extensors, ankle dorsiflexor and plantar flexor Sensory exam normal sensation to light touch in LEs except Left great toe Cerebellar exam normal finger to nose to finger as well as heel to shin in bilateral upper and lower extremities Finger to thumb opposition is intact. Oriented x 3 Speech without dysarthria Musculoskeletal: Full range of motion in all 4 extremities. No joint swelling   Assessment/Plan: 1. Functional deficits which require 3+ hours per day of interdisciplinary therapy in a comprehensive inpatient rehab setting. Physiatrist is providing close team supervision and 24 hour management of active medical problems listed below. Physiatrist  and rehab team continue to assess barriers to discharge/monitor patient progress toward functional and medical goals  Care Tool:  Bathing              Bathing assist       Upper Body Dressing/Undressing Upper body dressing        Upper body assist      Lower Body Dressing/Undressing Lower body dressing            Lower body assist       Toileting Toileting    Toileting assist       Transfers Chair/bed transfer  Transfers assist           Locomotion Ambulation   Ambulation assist              Walk 10 feet activity   Assist           Walk 50 feet activity   Assist           Walk 150 feet activity   Assist           Walk 10 feet on uneven surface  activity   Assist  Wheelchair     Assist               Wheelchair 50 feet with 2 turns activity    Assist            Wheelchair 150 feet activity     Assist          There were no vitals taken for this visit.  Medical Problem List and Plan: 1. 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             -patient may  shower             -ELOS/Goals: 14-16d Supervision goals   Metanx started 2.  Antithrombotics: -DVT/anticoagulation:  Pharmaceutical: Lovenox             -antiplatelet therapy: Monotherapy with Plavix 75 mg daily 3. Pain Management: Neurontin 100 mg twice daily, Tylenol as needed 4. Insomnia: Alprazolam scheduled HS  5. Neuropsych/cognition: This patient is capable of making decisions on his own behalf. 6. Skin/Wound Care: Routine skin checks 7. Fluids/Electrolytes/Nutrition: Routine in and outs with follow-up chemistries 8.  BPH.  Proscar 5 mg daily, Flomax 0.4 mg- changed to after  supper  9.  Hyperlipidemia.  Lipitor  10.  Constipation.  Colace 100 mg daily, MiraLAX daily as needed, magnesium level reviewed and is stable, messaged nursing regarding when his last BM was  11.  History of tobacco use.  Provide counseling  12.  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  Follow-up outpatient  13. Screening for vitamin D deficiency: add on vitamin D level  14. Congestion: Will d/c Mucinex as per patient's preference    LOS: 1 days A FACE TO FACE EVALUATION WAS PERFORMED  Drema Pry Raeford Brandenburg 03/22/2024, 9:47 AM

## 2024-03-23 DIAGNOSIS — I639 Cerebral infarction, unspecified: Secondary | ICD-10-CM | POA: Diagnosis not present

## 2024-03-23 MED ORDER — SORBITOL 70 % SOLN: 30.0000 mL | Freq: Once | Status: DC

## 2024-03-23 MED ORDER — B COMPLEX-C PO TABS
1.0000 | ORAL_TABLET | Freq: Every day | ORAL | Status: DC
  Administered 2024-03-23 – 2024-03-26 (×4): 1 via ORAL
  Filled 2024-03-23 (×4): qty 1

## 2024-03-23 NOTE — Progress Notes (Signed)
 Occupational Therapy Session Note  Patient Details  Name: James [name]. MRN: [id number] Date of Birth: [birth year]  Today's Date: 03/23/2024 OT Individual Time: 1450-1530 OT Individual Time Calculation (min): 40 min    Short Term Goals: Week 1:  OT Short Term Goal 1 (Week 1): STG = LTG due to ELOS  Skilled Therapeutic Interventions/Progress Updates:  Skilled OT intervention completed with focus on ADL retraining, functional endurance, and mobility within a shower context. Pt received seated in recliner, agreeable to shower during session. No pain reported.  Pt completed all sit > stands and ambulatory transfers with CGA/supervision while using rollator with good management of locking brakes. Min cues needed for body positioning during shower transfer for fall prevention and safety.  Ambulated > shower. Mod cues needed for sequencing/problem solving doffing of LB clothing prior to turning on water. Pt was able to bathe all parts with supervision, at the sit > stand level while using grab bar for balance. CGA ambulatory transfer > recliner, with cues needed for caution with stepping over bathroom threshold. Able to donn shirt/deo with set up A. Threaded LB clothing with increased time due to challenge accessing feet and fatigue expressed however supervision including in stance. Donned socks with supervision with same increased time/challenge.  Pt was able to recall prior PT and current OT events without assist, and OT filled out memory notebook for pt dependently. Team notified of pt statement of "not willing to stay until Tuesday" with recommendation that pt could DC sooner due to functional progress however awaiting daughter's approval. Pt remained seated in recliner, with chair alarm on/activated, and with all needs in reach at end of session.   Therapy Documentation Precautions:  Precautions Precautions: Fall Precaution/Restrictions Comments: new L hemi, old CVA with mild R  hemi Restrictions Weight Bearing Restrictions Per Provider Order: No    Therapy/Group: Individual Therapy  Melvyn Novas, MS, OTR/L  03/23/2024, 3:38 PM

## 2024-03-23 NOTE — Patient Care Conference (Addendum)
 Inpatient RehabilitationTeam Conference and Plan of Care Update Date: 03/23/2024   Time: 11:40 AM    Patient Name: James [name].      Medical Record Number: [id number]  Date of Birth: [birth year] Sex: Male         Room/Bed: 4W09C/4W09C-01 Payor Info: Payor: Advertising copywriter MEDICARE / Plan: [employer] MEDICARE / Product Type: *No Product type* /    Admit Date/Time:  03/21/2024  1:00 PM  Primary Diagnosis:  Cardioembolic stroke [facility])  Hospital Problems: Principal Problem:   Cardioembolic stroke [facility])    Expected Discharge Date: Expected Discharge Date: 03/29/24  Team Members Present: Physician leading conference: Dr. Sula Soda Social Worker Present: Dossie Der, LCSW Nurse Present: Chana Bode, RN PT Present: Blima Rich, PT OT Present: Candee Furbish, OT SLP Present: Jeannie Done, SLP     Current Status/Progress Goal Weekly Team Focus  Bowel/Bladder   Pt continent x2 with some constipation, LBM 4/5   Give PRNs to help pt have BM   Barrier: no more PRNs available to give pt    Swallow/Nutrition/ Hydration   Dys3/thin liquids, supervision for safe swallowing strategies   modI  consistent use of safe swallowing strategies    ADL's   Set up A UB, Min A LB, CGA toileting   Mod I to supervision   Barriers- mild cognitive impairments (memory), dynamic standing balance, functional endurance    Mobility   bed moiblity supervision, transfers with rollator CGA, gait 137ft with rollator CGA/min A, 8 6in steps with 2 handrails min A   Mod I  barriers: fatigue, global weakness/deconditioning, and decreased balance/coordination    Communication                Safety/Cognition/ Behavioral Observations  min-supervision for mildly complex problem solving but has assistance at home with iADLs - approaching goal level   supervision   mildly complex problem solving, use of memory strategies, carryover of day to day information    Pain   Pt having no pain issues    Keep pain level below 4   Monitor pain and treat to keep below level 4    Skin   Skin is intact, no abnormalities   Routine skin assessments every shift  Maintain clean intact skin      Discharge Planning:  Lives in ILF apartment with wife who has health issues of her own. Can do supervision. Family provides transportation   Team Discussion: Patient post CVA with hx. Of multi infarcts. Limited by fatigue/poor endurance, insomnia, balance deficits and overall deconditioning with mild cognitive deficits.  Patient on target to meet rehab goals: yes, currently needs close supervision for ADLs, CGA for transfers and min assist for steps.  Goals for discharge set for supervision/mod I due to sequencing and problem solving deficits with ADLS.  *See Care Plan and progress notes for long and short-term goals.   Revisions to Treatment Plan:  N/a   Teaching Needs: Safety, medications, transfers, toileting, etc. ZIO patch wear and care   Current Barriers to Discharge: Decreased caregiver support  Possible Resolutions to Barriers: Family education HH follow up services     Medical Summary Current Status: insomnia, constipation, BPH, CVA  Barriers to Discharge: Medical stability  Barriers to Discharge Comments: insomnia, constipation, BPH, CVA Possible Resolutions to Barriers/Weekly Focus: melatonin added, alprazolam scheduled, sorbitol and miralax ordered, continue flomax, continue statin/plavix, continue colace   Continued Need for Acute Rehabilitation Level of Care: The patient requires daily medical management by a  physician with specialized training in physical medicine and rehabilitation for the following reasons: Direction of a multidisciplinary physical rehabilitation program to maximize functional independence : Yes Medical management of patient stability for increased activity during participation in an intensive rehabilitation regime.: Yes Analysis of laboratory values  and/or radiology reports with any subsequent need for medication adjustment and/or medical intervention. : Yes   I attest that I was present, lead the team conference, and concur with the assessment and plan of the team.   Chana Bode B 03/23/2024, 2:26 PM

## 2024-03-23 NOTE — Progress Notes (Signed)
 Patient ID: James Current., male   DOB: 01/24/1934, 89 y.o.   MRN: [id number] Met with pt and spoke with James [name]-daughter via telephone to update regarding team conference goals of supervision-mod/I and target discharge date of 4/15. Pt thought he would go home by Friday. Team working on endurance, standing balance and weakness. Daughter had questions regarding his eating and if due to swallowing or his teeth since has has refused to go to the dentist. Messaged SP and OT regarding this. Discussed getting OP therapies at [facility] and will get order for them, daughter reports was just starting to get twice a week. No equipment needs. Both happy with his progress thus far. Continue to work on discharge needs.

## 2024-03-23 NOTE — Progress Notes (Signed)
 Upon rounding on the pt he had a bloody nose.  Pt states that this is a recurring event for him.  Pt states he constantly has mucus build up that crusts over in his nostrils and he picks at it causing it to bleed.  Pts face was wiped and there is no active bleeding.  Pt states he is in no pain. Pt was educated on not picking at his nose to cause the scab to bleed.

## 2024-03-23 NOTE — Progress Notes (Signed)
 Occupational Therapy Session Note  Patient Details  Name: James [name]. MRN: [id number] Date of Birth: 1934/06/12  Today's Date: 03/23/2024 OT Individual Time: 4098-1191 OT Individual Time Calculation (min): 56 min    Short Term Goals: Week 1:  OT Short Term Goal 1 (Week 1): STG = LTG due to ELOS  Skilled Therapeutic Interventions/Progress Updates:  Pt greeted supine in bed, pt agreeable to OT intervention.      Transfers/bed mobility/functional mobility:  Pt completed bed mobility with supervision. Pt completed functional ambulation greater than a household distance with Rollator and CGA.     ADLs:  Grooming: pt completed standing hand hygiene at sink with supervision for balance and MIN verbal cues for sequencing with pt needing cues to locate soap dispenser.  Footwear: pt donned slide on sneakers with set- up assist from EOB  Transfers: pt completed ambulatory toilet transfers with rollator and CGA Toileting: pt with continent b/b void completing 3/3 toileting tasks with distant supervision.    Exercises:  Pt completed below exercises to challenge global strength/endurance for higher level ADLs:  X10 sit>stands from mat table while holding 2 lb weighted ball  X10 sit>stands from mat able while holding 2 lb weighted ball then pushing ball OH- pt needed to sit half way through d/t fatigue Pt instructed to stand with weighted ball and pass ball behind pts back to simulate LB ADLs. Pt completed task with CGA   Assessments:  Box and Blocks Test measures unilateral gross manual dexterity. - Instructions The pt was instructed to carry one block over at a time and go as quickly as they could, making sure their fingertips crossed the partition. One minute was given to complete the task per UE. The pt was allowed a 15-second trial period prior to testing if needed. - Results The pt transferred 45 blocks with the R hand and 28 with the L hand. The total number of blocks  carried from one compartment to the other in one minute is scored per hand. Higher scores on the test indicate better gross manual dexterity.  - Norms for adults males 50-75+ -50-54 R 79 L 77.0 -55-59 R 75.2 L 73.8 -60-64 R 71.3 L 70.5 -65-69 R 68.5 L 67.4 -70-74 R 66.3 L 64.33 -75+ R 63.0 L 61.3    Ended session with pt supine in bed with all needs within reach and bed alarm activated.                    Therapy Documentation Precautions:  Precautions Precautions: Fall Precaution/Restrictions Comments: new L hemi, old CVA with mild R hemi Restrictions Weight Bearing Restrictions Per Provider Order: No    Pain:pt reports pain from bilateral numbness in feet, rest breaks provided as needed.     Therapy/Group: Individual Therapy  Pollyann Glen [facility] 03/23/2024, 12:11 PM

## 2024-03-23 NOTE — Progress Notes (Addendum)
 PROGRESS NOTE   Subjective/Complaints: Continues to have insomnia but in unsure whether he would like additional medications or increase in medication.  ROS: +insomnia, +dysphagia   Objective:   No results found. Recent Labs    03/21/24 1355 03/22/24 0457  WBC 5.2 5.7  HGB 12.6* 12.6*  HCT 38.7* 38.4*  PLT 145* 157   Recent Labs    03/21/24 0505 03/21/24 1355 03/22/24 0457  NA 139  --  138  K 3.6  --  3.7  CL 108  --  108  CO2 24  --  22  GLUCOSE 93  --  88  BUN 27*  --  23  CREATININE 1.15 1.17 1.29*  CALCIUM 9.0  --  9.1    Intake/Output Summary (Last 24 hours) at 03/23/2024 0948 Last data filed at 03/23/2024 0800 Gross per 24 hour  Intake 436 ml  Output 950 ml  Net -514 ml        Physical Exam: Vital Signs Blood pressure 138/74, pulse 77, temperature 97.6 F (36.4 C), temperature source Oral, resp. rate 16, SpO2 97%. Gen: no distress, normal appearing HEENT: oral mucosa pink and moist, NCAT Cardio: Reg rate Chest: normal effort, normal rate of breathing Abd: soft, non-distended Ext: no edema Psych: pleasant, normal affect Skin: intact Neurologic: Cranial nerves II through XII intact, motor strength is 5-/5 in bilateral deltoid, bicep, tricep, grip, 5/5 right and 4/5 left hip flexor, knee extensors, ankle dorsiflexor and plantar flexor Sensory exam normal sensation to light touch in LEs except Left great toe Cerebellar exam normal finger to nose to finger as well as heel to shin in bilateral upper and lower extremities Finger to thumb opposition is intact. Oriented x 3 Speech without dysarthria Musculoskeletal: Full range of motion in all 4 extremities. No joint swelling, stable 4/9   Assessment/Plan: 1. Functional deficits which require 3+ hours per day of interdisciplinary therapy in a comprehensive inpatient rehab setting. Physiatrist is providing close team supervision and 24 hour  management of active medical problems listed below. Physiatrist and rehab team continue to assess barriers to discharge/monitor patient progress toward functional and medical goals  Care Tool:  Bathing    Body parts bathed by patient: Right arm, Left arm, Chest, Abdomen, Front perineal area, Buttocks, Right upper leg, Left upper leg, Right lower leg, Left lower leg, Face         Bathing assist Assist Level: Contact Guard/Touching assist     Upper Body Dressing/Undressing Upper body dressing   What is the patient wearing?: Pull over shirt    Upper body assist Assist Level: Supervision/Verbal cueing    Lower Body Dressing/Undressing Lower body dressing      What is the patient wearing?: Underwear/pull up, Pants     Lower body assist Assist for lower body dressing: Contact Guard/Touching assist     Toileting Toileting    Toileting assist Assist for toileting: Minimal Assistance - Patient > 75%     Transfers Chair/bed transfer  Transfers assist     Chair/bed transfer assist level: Contact Guard/Touching assist     Locomotion Ambulation   Ambulation assist      Assist level: Minimal Assistance -  Patient > 75% Assistive device: Rollator Max distance: 167ft   Walk 10 feet activity   Assist     Assist level: Minimal Assistance - Patient > 75% Assistive device: Rollator   Walk 50 feet activity   Assist    Assist level: Minimal Assistance - Patient > 75% Assistive device: Rollator    Walk 150 feet activity   Assist Walk 150 feet activity did not occur: Safety/medical concerns (fatigue)         Walk 10 feet on uneven surface  activity   Assist Walk 10 feet on uneven surfaces activity did not occur: Safety/medical concerns (fatigue)         Wheelchair     Assist Is the patient using a wheelchair?: No   Wheelchair activity did not occur: N/A         Wheelchair 50 feet with 2 turns activity    Assist    Wheelchair 50  feet with 2 turns activity did not occur: N/A       Wheelchair 150 feet activity     Assist  Wheelchair 150 feet activity did not occur: N/A       Blood pressure 138/74, pulse 77, temperature 97.6 F (36.4 C), temperature source Oral, resp. rate 16, SpO2 97%.  Medical Problem List and Plan: 1. 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             -patient may  shower             -ELOS/Goals: 14-16d Supervision goals   Metanx started  Lives in independent living with his wife  Domingo Cocking pass ordered  B vitamins and D3 started  2.  Impaired mobility: continue Lovenox             -antiplatelet therapy: Monotherapy with Plavix 75 mg daily  3. Left hand pain : continue Neurontin 100 mg twice daily, Tylenol as needed. Vitamin C, D and B supplements started  4. Insomnia: Alprazolam scheduled HS, melatonin scheduled HS  5. Neuropsych/cognition: This patient is not capable of making decisions on his own behalf.  6. Skin/Wound Care: Routine skin checks  7. Fluids/Electrolytes/Nutrition: Routine in and outs with follow-up chemistries  8.  BPH.  Continue Proscar 5 mg daily, Flomax 0.4 mg- changed to after supper  9.  Hyperlipidemia.  Continue Lipitor  10.  Constipation. Had BM 4/9, d/c miralax  11.  History of tobacco use.  Provide counseling  12.  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  Follow-up outpatient  13. Suboptimal Vitamin D level: D3 started  14. Congestion: Will d/c Mucinex as per patient's preference  15. Dysphagia: continue SLP, supervision for meals  16. Mild cognitive impairment: continue SLP    LOS: 2 days A FACE TO FACE EVALUATION WAS PERFORMED  James [name] 03/23/2024, 9:48 AM

## 2024-03-23 NOTE — Progress Notes (Signed)
 Speech Language Pathology Daily Session Note  Patient Details  Name: James [name]. MRN: [id number] Date of Birth: 1934-09-26  Today's Date: 03/23/2024 SLP Individual Time: 7829-5621 SLP Individual Time Calculation (min): 54 min  Short Term Goals: Week 1: SLP Short Term Goal 1 (Week 1): Patient will tolerate Dys3/thin liquid diet without overt s/sx of penetration/aspiration given supervision cues for optimal positioning and safe swallowing strategies. SLP Short Term Goal 2 (Week 1): Patient will utilize internal/external memory strategies to recall new information with 80% accuracy give min verbal cues. SLP Short Term Goal 3 (Week 1): Patient will selectively attend to verbally presented information during memory tasks and utilize strategies for recall as appropriate given min verbal cues.  Skilled Therapeutic Interventions: SLP conducted skilled therapy session targeting cognitive retraining goals and dysphagia management. Upon SLP entry, patient eating cereal with milk in a nearly fully reclined position. SLP provided education re: importance of sitting fully upright during meals given history of oropharyngeal dysphagia. Patient verbalized understanding, but question carryover. Placed additional room sign emphasizing importance of upright positioning during PO intake. Cognitively, patient exhibiting stark improvements from evaluation. SLP introduced WRAP memory strategies then facilitated controlled memory task. After looking at provided picture, patient recalled all details with 100% accuracy immediately after presentation and again after 5 minute distracted delay with supervision. In remaining minutes of session, targeted sustained attention to detail and working memory with patient achieving 90% accuracy with supervision and 100% accuracy given min assist. Patient was left in lowered bed with call bell in reach and bed alarm set. SLP will continue to target goals per plan of care.        Pain Pain Assessment Pain Scale: 0-10 Pain Score: 0-No pain  Therapy/Group: Individual Therapy  Jeannie Done, M.A., CCC-SLP  Yetta Barre 03/23/2024, 8:59 AM

## 2024-03-23 NOTE — Progress Notes (Incomplete)
 Occupational Therapy Session Note  Patient Details  Name: James [name]. MRN: [id number] Date of Birth: 1934/03/17  {CHL IP REHAB OT TIME CALCULATIONS:[id number]}   Short Term Goals: Week 1:  OT Short Term Goal 1 (Week 1): STG = LTG due to ELOS  Skilled Therapeutic Interventions/Progress Updates:      Therapy Documentation Precautions:  Precautions Precautions: Fall Precaution/Restrictions Comments: new L hemi, old CVA with mild R hemi Restrictions Weight Bearing Restrictions Per Provider Order: No   Therapy/Group: Individual Therapy  Lou Cal, OTR/L, MSOT,  03/23/2024, 9:42 PM

## 2024-03-23 NOTE — Progress Notes (Signed)
 Physical Therapy Session Note  Patient Details  Name: James [name]. MRN: [id number] Date of Birth: 03-07-1934  Today's Date: 03/23/2024 PT Individual Time: 7829-5621 PT Individual Time Calculation (min): 40 min   Short Term Goals: Week 1:  PT Short Term Goal 1 (Week 1): STG=LTG due to LOS  Skilled Therapeutic Interventions/Progress Updates:   Received pt semi-reclined in bed, pt reluctantly agreeable to PT treatment, and denied any pain during session but reported fatigue from not sleeping well. Session with emphasis on functional mobility/transfers, generalized strengthening and endurance, and gait training. Pt transferred semi-reclined<>sitting L EOB with HOB slightly elevated and use of bedrails with supervision. Donned shoes with max A and pt performed all transfers with rollator and CGA/close supervision throughout session.   Pt ambulated 117ft x 2 trials with rollator and close supervision to/from dayroom. Pt performed blocked practice sit<>stands 2x10 without UE support and close supervision with emphasis on quad strength. Pt performed standing alternating marches 2x10 with 0.5lb ankle weights with BUE support on rollator. During rest breaks, pt actively engaging with therapist discussing places in [place] he has traveled, growing up in [place], and shows he enjoys on Television. Returned to room and concluded session with pt sitting in recliner, needs within reach, and chair pad alarm on.   Therapy Documentation Precautions:  Precautions Precautions: Fall Precaution/Restrictions Comments: new L hemi, old CVA with mild R hemi Restrictions Weight Bearing Restrictions Per Provider Order: No  Therapy/Group: Individual Therapy Marlana Salvage Zaunegger Blima Rich PT, DPT 03/23/2024, 6:53 AM

## 2024-03-24 DIAGNOSIS — I639 Cerebral infarction, unspecified: Secondary | ICD-10-CM | POA: Diagnosis not present

## 2024-03-24 NOTE — Progress Notes (Signed)
 Occupational Therapy Session Note  Patient Details  Name: James [name]. MRN: [id number] Date of Birth: June 09, 1934  Today's Date: 03/24/2024 OT Individual Time: 4098-1191 OT Individual Time Calculation (min): 28 min  and Today's Date: 03/24/2024 OT Missed Time: 17 Minutes Missed Time Reason: Patient fatigue   Short Term Goals: Week 1:  OT Short Term Goal 1 (Week 1): STG = LTG due to ELOS  Skilled Therapeutic Interventions/Progress Updates:    Patient agreeable to participate in OT session. Reports 0/10 pain level.  Arrived to room with nursing present for medication administration.  Pt completed all bed mobility at SBA while transitioning from supine to sit and sit to supine. Bed rail was utilized. Pt reports increased fatigue this AM stating that he was woken up from a deep sleep and he verbalized difficulty with fully waking up.   Pt completed LB dressing while seated EOB while donning pants with CGA.  While seated on Rolator, pt sat at sink and completed grooming task at Mod I level.  Pt walked from sink to bed without AD and SBA at end of session. Session ended early per pt's request d/t fatigue level.      Therapy Documentation Precautions:  Precautions Precautions: Fall Precaution/Restrictions Comments: new L hemi, old CVA with mild R hemi Restrictions Weight Bearing Restrictions Per Provider Order: No   Therapy/Group: Individual Therapy  [name], OTR/L,CBIS  Supplemental OT - [facility] and [facility] Secure Chat Preferred   03/24/2024, 7:57 AM

## 2024-03-24 NOTE — IPOC Note (Signed)
 Overall Plan of Care (IPOC) Patient Details Name: James [name]. MRN: [id number] DOB: July 06, 1934  Admitting Diagnosis: Cardioembolic stroke [facility])  Hospital Problems: Principal Problem:   Cardioembolic stroke [facility])     Functional Problem List: Nursing Bladder, Bowel, Safety, Endurance, Medication Management  PT Balance, Endurance, Motor, Pain, Sensory, Skin Integrity  OT Balance, Cognition, Endurance, Motor, Sensory  SLP Cognition, Nutrition  TR         Basic ADL's: OT Bathing, Dressing, Toileting     Advanced  ADL's: OT Simple Meal Preparation     Transfers: PT Bed Mobility, Bed to Chair, Car, Occupational psychologist, Research scientist (life sciences): PT Ambulation, Psychologist, prison and probation services, Stairs     Additional Impairments: OT None  SLP Swallowing, Regulatory affairs officer, Attention  TR      Anticipated Outcomes Item Anticipated Outcome  Self Feeding Independent  Swallowing  modI   Basic self-care  Supervision - mod I  Engineer, technical sales Transfers Supervision  Bowel/Bladder  manage bowel and bladder w mod I  Transfers  Mod I with LRAD  Locomotion  Mod I with LRAD  Communication     Cognition  supervision  Pain  n/a  Safety/Judgment  manage safety w cues   Therapy Plan: PT Intensity: Minimum of 1-2 x/day ,45 to 90 minutes PT Frequency: 5 out of 7 days PT Duration Estimated Length of Stay: 5-7 days OT Intensity: Minimum of 1-2 x/day, 45 to 90 minutes OT Frequency: 5 out of 7 days OT Duration/Estimated Length of Stay: 7-10 days SLP Intensity: Minumum of 1-2 x/day, 30 to 90 minutes SLP Frequency: 1 to 3 out of 7 days SLP Duration/Estimated Length of Stay: ~2 weeks   Team Interventions: Nursing Interventions Bladder Management, Bowel Management, Disease Management/Prevention, Medication Management, Discharge Planning, Patient/Family Education  PT interventions Ambulation/gait training, Discharge planning, Functional mobility  training, Psychosocial support, Therapeutic Activities, Visual/perceptual remediation/compensation, Balance/vestibular training, Disease management/prevention, Neuromuscular re-education, Skin care/wound management, Therapeutic Exercise, Wheelchair propulsion/positioning, Cognitive remediation/compensation, DME/adaptive equipment instruction, Pain management, Splinting/orthotics, UE/LE Strength taining/ROM, Community reintegration, Equities trader education, Museum/gallery curator, UE/LE Coordination activities, Functional electrical stimulation  OT Interventions Warden/ranger, Discharge planning, Pain management, Self Care/advanced ADL retraining, Therapeutic Activities, UE/LE Coordination activities, Cognitive remediation/compensation, Disease mangement/prevention, Functional mobility training, Patient/family education, Therapeutic Exercise, DME/adaptive equipment instruction, Neuromuscular re-education, UE/LE Strength taining/ROM  SLP Interventions Cognitive remediation/compensation, Functional tasks, Cueing hierarchy, Patient/family education, Dysphagia/aspiration precaution training, Therapeutic Activities  TR Interventions    SW/CM Interventions Discharge Planning, Psychosocial Support, Patient/Family Education   Barriers to Discharge MD  Medical stability  Nursing Decreased caregiver support 1 level [facility] [employer]) w spouse since Nov 24, facility provides meals and ride to stores  PT Other (comments) occasional pain, decreased balance due to 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, global weakness/deconditioning  OT Lack of/limited family support    SLP      SW Insurance for SNF coverage     Team Discharge Planning: Destination: PT-Home (IL[facility]) ,OT- Home , SLP-Home Projected Follow-up: PT-Home health PT (at [facility]), OT-  Outpatient OT, SLP-None Projected Equipment Needs: PT-To be determined, OT- To be determined, SLP-None recommended by SLP Equipment Details: PT-has rollator, OT-  Patient/family  involved in discharge planning: PT- Patient,  OT-Patient, SLP-Patient  MD ELOS: 5 days Medical Rehab Prognosis:  Excellent Assessment: The patient has been admitted for CIR therapies with the diagnosis of cardioembolic stroke. The team will be addressing functional mobility, strength, stamina, balance, safety, adaptive techniques and  equipment, self-care, bowel and bladder mgt, patient and caregiver education. Goals have been set at modI. Anticipated discharge destination is home.        See Team Conference Notes for weekly updates to the plan of care

## 2024-03-24 NOTE — Progress Notes (Addendum)
 Patient ID: James [name]., male   DOB: 1934-03-18, 89 y.o.   MRN: [id number] Spoke with daughter-Margaret to let know team and MD along with pt to change discharge to Sat 4/12. Daughter has some medical questions and would like MD to call her. Have messaged MD to call daughter.  2;50 PM Have reached out to [facility] [place] to find out need for order for follow up PT & OT. They contract with [facility] have faxed order and notes.

## 2024-03-24 NOTE — Progress Notes (Signed)
 PROGRESS NOTE   Subjective/Complaints: Continues to have insomnia but in unsure whether he would like additional medications or increase in medication.  ROS: +insomnia, +dysphagia, +improved mood   Objective:   No results found. Recent Labs    03/21/24 1355 03/22/24 0457  WBC 5.2 5.7  HGB 12.6* 12.6*  HCT 38.7* 38.4*  PLT 145* 157   Recent Labs    03/21/24 1355 03/22/24 0457  NA  --  138  K  --  3.7  CL  --  108  CO2  --  22  GLUCOSE  --  88  BUN  --  23  CREATININE 1.17 1.29*  CALCIUM  --  9.1    Intake/Output Summary (Last 24 hours) at 03/24/2024 0944 Last data filed at 03/24/2024 0729 Gross per 24 hour  Intake 410 ml  Output 875 ml  Net -465 ml        Physical Exam: Vital Signs Blood pressure 115/74, pulse 89, temperature 98.1 F (36.7 C), temperature source Oral, resp. rate 18, SpO2 97%. Gen: no distress, normal appearing HEENT: oral mucosa pink and moist, NCAT Cardio: Reg rate Chest: normal effort, normal rate of breathing Abd: soft, non-distended Ext: no edema Psych: pleasant, normal affect Skin: intact Neurologic: Cranial nerves II through XII intact, motor strength is 5-/5 in bilateral deltoid, bicep, tricep, grip, 5/5 right and 4/5 left hip flexor, knee extensors, ankle dorsiflexor and plantar flexor Sensory exam normal sensation to light touch in LEs except Left great toe Cerebellar exam normal finger to nose to finger as well as heel to shin in bilateral upper and lower extremities Finger to thumb opposition is intact. Oriented x 3 Speech without dysarthria Musculoskeletal: Full range of motion in all 4 extremities. No joint swelling, stable 4/10   Assessment/Plan: 1. Functional deficits which require 3+ hours per day of interdisciplinary therapy in a comprehensive inpatient rehab setting. Physiatrist is providing close team supervision and 24 hour management of active medical  problems listed below. Physiatrist and rehab team continue to assess barriers to discharge/monitor patient progress toward functional and medical goals  Care Tool:  Bathing    Body parts bathed by patient: Right arm, Left arm, Chest, Abdomen, Front perineal area, Buttocks, Right upper leg, Left upper leg, Right lower leg, Left lower leg, Face         Bathing assist Assist Level: Supervision/Verbal cueing     Upper Body Dressing/Undressing Upper body dressing   What is the patient wearing?: Pull over shirt    Upper body assist Assist Level: Set up assist    Lower Body Dressing/Undressing Lower body dressing      What is the patient wearing?: Underwear/pull up, Pants     Lower body assist Assist for lower body dressing: Supervision/Verbal cueing     Toileting Toileting    Toileting assist Assist for toileting: Supervision/Verbal cueing     Transfers Chair/bed transfer  Transfers assist     Chair/bed transfer assist level: Supervision/Verbal cueing     Locomotion Ambulation   Ambulation assist      Assist level: Contact Guard/Touching assist Assistive device: Rollator Max distance: 168ft   Walk 10 feet activity  Assist     Assist level: Contact Guard/Touching assist Assistive device: Rollator   Walk 50 feet activity   Assist    Assist level: Contact Guard/Touching assist Assistive device: Rollator    Walk 150 feet activity   Assist Walk 150 feet activity did not occur: Safety/medical concerns (fatigue)         Walk 10 feet on uneven surface  activity   Assist Walk 10 feet on uneven surfaces activity did not occur: Safety/medical concerns (fatigue)         Wheelchair     Assist Is the patient using a wheelchair?: No   Wheelchair activity did not occur: N/A         Wheelchair 50 feet with 2 turns activity    Assist    Wheelchair 50 feet with 2 turns activity did not occur: N/A       Wheelchair 150 feet  activity     Assist  Wheelchair 150 feet activity did not occur: N/A       Blood pressure 115/74, pulse 89, temperature 98.1 F (36.7 C), temperature source Oral, resp. rate 18, SpO2 97%.  Medical Problem List and Plan: 1. 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             -patient may  shower             -ELOS/Goals: 14-16d Supervision goals   Metanx started  Lives in independent living with his wife  Domingo Cocking pass ordered  B vitamins and D3 started  Updated daughter  2.  Impaired mobility: continue Lovenox             -antiplatelet therapy: Monotherapy with Plavix 75 mg daily  3. Left hand pain : continue Neurontin 100 mg twice daily, Tylenol as needed. Vitamin C, D and B supplements started  4. Insomnia: Alprazolam scheduled HS, melatonin scheduled HS  5. Neuropsych/cognition: This patient is not capable of making decisions on his own behalf.  6. Skin/Wound Care: Routine skin checks  7. Fluids/Electrolytes/Nutrition: Routine in and outs with follow-up chemistries  8.  BPH.  continue Proscar 5 mg daily, Flomax 0.4 mg- changed to after supper  9.  Hyperlipidemia.  continue Lipitor  10.  Constipation. Had BM 4/9, d/c miralax  11.  History of tobacco use.  Provide counseling  12.  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  Follow-up outpatient  13. Suboptimal Vitamin D level: D3 started, discussed that mood has improved  14. Congestion: Will d/c Mucinex as per patient's preference  15. Dysphagia: continue SLP, supervision for meals  16. Mild cognitive impairment: continue SLP     LOS: 3 days A FACE TO FACE EVALUATION WAS PERFORMED  James [name] James [name] 03/24/2024, 9:44 AM

## 2024-03-24 NOTE — Progress Notes (Signed)
 Physical Therapy Session Note  Patient Details  Name: James [name]. MRN: [id number] Date of Birth: May 02, 1934  Today's Date: 03/24/2024 PT Individual Time: 0102-7253 PT Individual Time Calculation (min): 69 min   Short Term Goals: Week 1:  PT Short Term Goal 1 (Week 1): STG=LTG due to LOS  Skilled Therapeutic Interventions/Progress Updates:   Received pt semi-reclined in bed, pt agreeable to PT treatment, and denied any pain but reported continuous fatigue. Session with emphasis on functional mobility/transfers, generalized strengthening and endurance, dynamic standing balance/coordination, simulated car transfers, and gait training. Pt transferred semi-reclined<>sitting L EOB with HOB slightly elevated and use of bedrails with supervision/mod I. Donned shoes with supervision.   Pt performed all transfers with rollator and supervision throughout session - occasional cues to lock brakes prior to standing. Pt ambulated 146ft x 2 with rollator and close supervision to ortho gym. Pt performed ambulatory simulated car transfer with rollator and supervision - pt opting to enter via side stepping method, then ambulated 3ft on uneven surfaces (ramp) with rollator and supervision. Pt able to stand and pick up object from floor with rollator and close supervision - cues to lock brakes prior to bending over. Pt reports having reacher at home but doesn't use it (wife does). Pt then ambulated >388ft with rollator and supervision to dayroom. Took seated rest break then performed seated BUE/BLE strengthening on Nustep at workload 5 with 1 rest break. Summary: 0.2 miles, 381 steps, average METS 2.1. Pt performed standing alternating toe taps to 3in step with min HHA 2x10 bilaterally with emphasis on standing balance/coordination.   Transitioned to seated knee extensions 2x10 bilaterally with 1.5lb ankle weights during active seated rest break - noted slight ataxia and impaired coordination in BLEs with  activity. Ambulated 111ft with rollator and supervision back to room. Concluded session with pt sitting in recliner with all needs within reach awaiting upcoming PT session.   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

## 2024-03-24 NOTE — Progress Notes (Signed)
 Physical Therapy Session Note  Patient Details  Name: James [name]. MRN: [id number] Date of Birth: July 02, 1934  Today's Date: 03/24/2024 PT Individual Time: 1345-1410 PT Individual Time Calculation (min): 25 min   Short Term Goals: Week 1:  PT Short Term Goal 1 (Week 1): STG=LTG due to LOS  Skilled Therapeutic Interventions/Progress Updates:      Pt sitting up in recliner and in agreement to therapy session. Pt aware of DC date and is eager to return home.   Sit to stand with rollator with supervision and min cues for locking brakes. Offered to work on therapy outside for change of scenery and boost mood but patient declines. Ambulates with supervision and rollator from his room to ortho rehab gym ~239ft. Pt setup at Nustep UE ergometer and completed for x4 minutes at L1 resistance with scenic program to help with distraction and effort. Completed > 100 rotations and 0.1 miles. Ambulated back to his room at similar assist level using the rollator - pt needing min cues to locate room #.   Pt requesting to lie down to rest - bed mobility completed without assist but he did require safety cues for approaching the bed. Alarm on and needs met.   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

## 2024-03-24 NOTE — Progress Notes (Signed)
 Physical Therapy Session Note  Patient Details  Name: James [name]. MRN: [id number] Date of Birth: [birth year]  Today's Date: 03/24/2024 PT Individual Time: 1121-1201 PT Individual Time Calculation (min): 40 min   Short Term Goals: Week 1:  PT Short Term Goal 1 (Week 1): STG=LTG due to LOS  Skilled Therapeutic Interventions/Progress Updates: Patient sitting in recliner on entrance to room. Patient alert and agreeable to PT session.   Patient reported no pain during session. Pt feels adequately equipped to d/c home with no questions/concerns (d/c 4/12).  Therapeutic Activity:  - Pt ambulated 300'+ from day room gym to central then back to room in rollator with supervision in order to improve community ambulation. Pt with VC to decrease cadence and to increase upright posture due to forward flexed presentation (pt reported no having "much" WB on handles of rollator). Pt did not require rest break  Gait Training:  Pt ambulated from room<>day room gym using rollator with supervision for safety. Pt in day room and ambulated short distance (<50') with L HHA and with light minA (pt with initial fear of ambulating without AD - PTA provided encouragement). Pt then ambulated around day room/nsg loop without HHA and with CGA with VC to increase upright posture to avoid anterior LOB, and to decrease cadence.   Neuromuscular Re-ed: NMR facilitated during session with focus on dynamic standing balance and coordination. - Tossing bean bags to board with supervision. Progressed to standing on airex pad after several tosses to increase dynamic standing balance. Pt with supervision, then progressed to holding 2 cones with disc between wide base (stacked) on L UE, and tossing with R UE. Pt with improved coordination to hole on board on final 2 rounds. Pt demonstrated ankle strategies to maintain standing balance  NMR performed for improvements in motor control and coordination, balance, sequencing,  judgement, and self confidence/ efficacy in performing all aspects of mobility at highest level of independence.   Patient sitting in recliner at end of session with brakes locked, and all needs within reach.      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

## 2024-03-25 ENCOUNTER — Other Ambulatory Visit (HOSPITAL_COMMUNITY): Payer: Self-pay

## 2024-03-25 DIAGNOSIS — I639 Cerebral infarction, unspecified: Secondary | ICD-10-CM | POA: Diagnosis not present

## 2024-03-25 MED ORDER — TAMSULOSIN HCL 0.4 MG PO CAPS
0.4000 mg | ORAL_CAPSULE | Freq: Every day | ORAL | 0 refills | Status: AC
  Filled 2024-03-25: qty 30, 30d supply, fill #0

## 2024-03-25 MED ORDER — MAGNESIUM HYDROXIDE 400 MG/5ML PO SUSP
15.0000 mL | Freq: Once | ORAL | Status: AC
  Administered 2024-03-25: 15 mL via ORAL
  Filled 2024-03-25: qty 30

## 2024-03-25 MED ORDER — VITAMIN D3 25 MCG PO TABS
1000.0000 [IU] | ORAL_TABLET | Freq: Every day | ORAL | 0 refills | Status: AC
Start: 2024-03-25 — End: ?
  Filled 2024-03-25: qty 30, 30d supply, fill #0

## 2024-03-25 MED ORDER — MELATONIN 3 MG PO TABS
3.0000 mg | ORAL_TABLET | Freq: Every day | ORAL | 0 refills | Status: AC
  Filled 2024-03-25: qty 30, 30d supply, fill #0

## 2024-03-25 MED ORDER — FINASTERIDE 5 MG PO TABS
5.0000 mg | ORAL_TABLET | Freq: Every day | ORAL | 0 refills | Status: AC
  Filled 2024-03-25 (×2): qty 30, 30d supply, fill #0

## 2024-03-25 MED ORDER — ATORVASTATIN CALCIUM 40 MG PO TABS
40.0000 mg | ORAL_TABLET | Freq: Every day | ORAL | 0 refills | Status: AC
  Filled 2024-03-25: qty 30, 30d supply, fill #0

## 2024-03-25 MED ORDER — GABAPENTIN 100 MG PO CAPS
100.0000 mg | ORAL_CAPSULE | Freq: Two times a day (BID) | ORAL | 0 refills | Status: DC
Start: 2024-03-25 — End: 2024-04-11
  Filled 2024-03-25: qty 60, 30d supply, fill #0

## 2024-03-25 MED ORDER — ALPRAZOLAM 0.25 MG PO TABS
0.2500 mg | ORAL_TABLET | Freq: Every day | ORAL | 0 refills | Status: AC
  Filled 2024-03-25: qty 30, 30d supply, fill #0

## 2024-03-25 MED ORDER — CLOPIDOGREL BISULFATE 75 MG PO TABS
75.0000 mg | ORAL_TABLET | Freq: Every day | ORAL | 0 refills | Status: AC
Start: 2024-03-25 — End: ?
  Filled 2024-03-25: qty 30, 30d supply, fill #0

## 2024-03-25 NOTE — Plan of Care (Signed)
  Problem: RH Balance Goal: LTG Patient will maintain dynamic standing with ADLs (OT) Description: LTG:  Patient will maintain dynamic standing balance with assist during activities of daily living (OT)  Outcome: Completed/Met   Problem: Sit to Stand Goal: LTG:  Patient will perform sit to stand in prep for activites of daily living with assistance level (OT) Description: LTG:  Patient will perform sit to stand in prep for activites of daily living with assistance level (OT) Outcome: Completed/Met   Problem: RH Bathing Goal: LTG Patient will bathe all body parts with assist levels (OT) Description: LTG: Patient will bathe all body parts with assist levels (OT) Outcome: Completed/Met   Problem: RH Dressing Goal: LTG Patient will perform upper body dressing (OT) Description: LTG Patient will perform upper body dressing with assist, with/without cues (OT). Outcome: Completed/Met Goal: LTG Patient will perform lower body dressing w/assist (OT) Description: LTG: Patient will perform lower body dressing with assist, with/without cues in positioning using equipment (OT) Outcome: Completed/Met   Problem: RH Toileting Goal: LTG Patient will perform toileting task (3/3 steps) with assistance level (OT) Description: LTG: Patient will perform toileting task (3/3 steps) with assistance level (OT)  Outcome: Completed/Met   Problem: RH Simple Meal Prep Goal: LTG Patient will perform simple meal prep w/assist (OT) Description: LTG: Patient will perform simple meal prep with assistance, with/without cues (OT). Outcome: Completed/Met   Problem: RH Toilet Transfers Goal: LTG Patient will perform toilet transfers w/assist (OT) Description: LTG: Patient will perform toilet transfers with assist, with/without cues using equipment (OT) Outcome: Completed/Met   Problem: RH Tub/Shower Transfers Goal: LTG Patient will perform tub/shower transfers w/assist (OT) Description: LTG: Patient will perform  tub/shower transfers with assist, with/without cues using equipment (OT) Outcome: Completed/Met   Problem: RH Memory Goal: LTG Patient will demonstrate ability for day to day recall/carry over during activities of daily living with assistance level (OT) Description: LTG:  Patient will demonstrate ability for day to day recall/carry over during activities of daily living with assistance level (OT). Outcome: Completed/Met

## 2024-03-25 NOTE — Progress Notes (Signed)
 Inpatient Rehabilitation Care Coordinator Discharge Note DC SAT 4/12  Patient Details  Name: James [name]. MRN: [id number] Date of Birth: 20-Sep-1934   Discharge location: BACK TO [facility] [place] INDEPENDENT L[facility]IGN APARTMENT WITH WIFE  Length of Stay: 5 DAYS  Discharge activity level: SUPERVISION-MOD/I LEVEL  Home/community participation: ACTIVE  Patient response QM:VHQION Literacy - How often do you need to have someone help you when you read instructions, pamphlets, or other written material from your doctor or pharmacy?: Rarely  Patient response GE:XBMWUX Isolation - How often do you feel lonely or isolated from those around you?: Never  Services provided included: MD, RD, PT, OT, SLP, RN, CM, Pharmacy, SW  Financial Services:  Financial Services Utilized: Private Insurance [facility] MEDICARE  Choices offered to/list presented to: PT AND DAUGHTER  Follow-up services arranged:  Outpatient, Patient/Family request agency HH/DME    Outpatient Servicies: [facility] CONTRACTED [facility]ERAPY AT [facility] WILL REACH OUT TO PT AND FMAILY TO SET UP APPOINTMENTS   HH/DME Requested Agency: [facility]  NO EQUIPMENT NEEDS HAS FROM PREVIOUS ADMISSIONS  Patient response to transportation need: Is the patient able to respond to transportation needs?: Yes In the past 12 months, has lack of transportation kept you from medical appointments or from getting medications?: No In the past 12 months, has lack of transportation kept you from meetings, work, or from getting things needed for daily living?: No   Patient/Family verbalized understanding of follow-up arrangements:  Yes  Individual responsible for coordination of the follow-up plan: [facility] [phone]  Confirmed correct DME delivered: Lucy Chris 03/25/2024    Comments (or additional information): PT DID WELL AND GOT BACK TO BASELINE FUNCTION. ALL READY FOR RETURN BACK TO [facility]  Summary of Stay     Date/Time Discharge Planning CSW  03/23/24 1004 Lives in ILF apartment with wife who has health issues of her own. Can do supervision. Family provides transportation RGD       [place] Frieden, Lemar Livings

## 2024-03-25 NOTE — Progress Notes (Signed)
 PROGRESS NOTE   Subjective/Complaints: C/o constipation, milk of mag ordered, last BM was 4/9 Messaged nursing to let her know that patient can leave tomorrow morning when ready  ROS: +insomnia, +dysphagia, +improved mood, stable cognition   Objective:   No results found. No results for input(s): "WBC", "HGB", "HCT", "PLT" in the last 72 hours.  No results for input(s): "NA", "K", "CL", "CO2", "GLUCOSE", "BUN", "CREATININE", "CALCIUM" in the last 72 hours.   Intake/Output Summary (Last 24 hours) at 03/25/2024 0939 Last data filed at 03/25/2024 0731 Gross per 24 hour  Intake 540 ml  Output 300 ml  Net 240 ml        Physical Exam: Vital Signs Blood pressure (!) 124/59, pulse (!) 56, temperature 97.8 F (36.6 C), temperature source Oral, resp. rate 17, SpO2 94%. Gen: no distress, normal appearing HEENT: oral mucosa pink and moist, NCAT Cardio: Reg rate Chest: normal effort, normal rate of breathing Abd: soft, non-distended Ext: no edema Psych: pleasant, normal affect Skin: intact Neurologic: Cranial nerves II through XII intact, motor strength is 5-/5 in bilateral deltoid, bicep, tricep, grip, 5/5 right and 4/5 left hip flexor, knee extensors, ankle dorsiflexor and plantar flexor Sensory exam normal sensation to light touch in LEs except Left great toe Cerebellar exam normal finger to nose to finger as well as heel to shin in bilateral upper and lower extremities Finger to thumb opposition is intact. Oriented x 3 Speech without dysarthria Musculoskeletal: Full range of motion in all 4 extremities. No joint swelling, stable 4/11   Assessment/Plan: 1. Functional deficits which require 3+ hours per day of interdisciplinary therapy in a comprehensive inpatient rehab setting. Physiatrist is providing close team supervision and 24 hour management of active medical problems listed below. Physiatrist and rehab team  continue to assess barriers to discharge/monitor patient progress toward functional and medical goals  Care Tool:  Bathing    Body parts bathed by patient: Right arm, Left arm, Chest, Abdomen, Front perineal area, Buttocks, Right upper leg, Left upper leg, Right lower leg, Left lower leg, Face         Bathing assist Assist Level: Supervision/Verbal cueing     Upper Body Dressing/Undressing Upper body dressing   What is the patient wearing?: Pull over shirt    Upper body assist Assist Level: Set up assist    Lower Body Dressing/Undressing Lower body dressing      What is the patient wearing?: Underwear/pull up, Pants     Lower body assist Assist for lower body dressing: Set up assist     Toileting Toileting    Toileting assist Assist for toileting: Supervision/Verbal cueing     Transfers Chair/bed transfer  Transfers assist     Chair/bed transfer assist level: Supervision/Verbal cueing     Locomotion Ambulation   Ambulation assist      Assist level: Supervision/Verbal cueing Assistive device: Rollator Max distance: >316ft   Walk 10 feet activity   Assist     Assist level: Supervision/Verbal cueing Assistive device: Rollator   Walk 50 feet activity   Assist    Assist level: Supervision/Verbal cueing Assistive device: Rollator    Walk 150 feet activity  Assist Walk 150 feet activity did not occur: Safety/medical concerns (fatigue)  Assist level: Supervision/Verbal cueing Assistive device: Rollator    Walk 10 feet on uneven surface  activity   Assist Walk 10 feet on uneven surfaces activity did not occur: Safety/medical concerns (fatigue)   Assist level: Supervision/Verbal cueing Assistive device: Rollator   Wheelchair     Assist Is the patient using a wheelchair?: No             Wheelchair 50 feet with 2 turns activity    Assist            Wheelchair 150 feet activity     Assist           Blood pressure (!) 124/59, pulse (!) 56, temperature 97.8 F (36.6 C), temperature source Oral, resp. rate 17, SpO2 94%.  Medical Problem List and Plan: 1. 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             -patient may  shower             -ELOS/Goals: 14-16d Supervision goals   Metanx started  Lives in independent living with his wife  Domingo Cocking pass ordered  B vitamins and D3 started  Updated daughter  2.  Impaired mobility: continue Lovenox             -antiplatelet therapy: Monotherapy with Plavix 75 mg daily  3. Left hand pain : continue Neurontin 100 mg twice daily, Tylenol as needed. Vitamin C, D and B supplements started  4. Insomnia: Alprazolam scheduled HS, melatonin scheduled HS  5. Neuropsych/cognition: This patient is not capable of making decisions on his own behalf.  6. Skin/Wound Care: Routine skin checks  7. Fluids/Electrolytes/Nutrition: Routine in and outs with follow-up chemistries  8.  BPH.  continue Proscar 5 mg daily, Flomax 0.4 mg- changed to after supper  9.  Hyperlipidemia.  continue Lipitor  10.  Constipation. Had BM 4/9, d/c miralax  11.  History of tobacco use.  Provide counseling  12.  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  Follow-up outpatient  13. Suboptimal Vitamin D level: D3 started, discussed that mood has improved  14. Congestion: Will d/c Mucinex as per patient's preference  15. Dysphagia: continue SLP, supervision for meals  16. Mild cognitive impairment: continue SLP, discussed with daughter that this seems to be at baseline  17. Hypotension: continue to monitor BP TID  18. Bradycardia: continue to monitor HR TID     LOS: 4 days A FACE TO FACE EVALUATION WAS PERFORMED  Drema Pry  Tymothy Cass 03/25/2024, 9:39 AM

## 2024-03-25 NOTE — Progress Notes (Signed)
 Occupational Therapy Discharge Summary  Patient Details  Name: James [name]. MRN: [id number] Date of Birth: 05/05/1934  Date of Discharge from OT service:March 25, 2024   Patient has met 10 of 10 long term goals due to improved activity tolerance, improved balance, improved awareness, and improved coordination.  Patient to discharge at overall Supervision level.  Patient's care partner is independent to provide the necessary physical and cognitive assistance at discharge.    All goals met  Recommendation:  Patient will benefit from ongoing skilled OT services in outpatient setting to continue to advance functional skills in the area of BADL and iADL.  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 Restrictions Weight Bearing Restrictions Per Provider Order: No ADL ADL Eating: Independent Where Assessed-Eating: Chair Grooming: Modified independent Where Assessed-Grooming: Sitting at sink Upper Body Bathing: Setup Where Assessed-Upper Body Bathing: Shower Lower Body Bathing: Supervision/safety Where Assessed-Lower Body Bathing: Shower Upper Body Dressing: Setup Where Assessed-Upper Body Dressing: Wheelchair Lower Body Dressing: Setup Where Assessed-Lower Body Dressing: Sitting at sink, Standing at sink Toileting: Supervision/safety Where Assessed-Toileting: Teacher, adult education: Modified Community education officer Method: Proofreader: Raised toilet seat, Other (comment) (rollator) Tub/Shower Transfer: Not assessed Tub/Shower Transfer Method: Unable to assess Film/video editor: Close supervision Film/video editor Method: Designer, industrial/product: Emergency planning/management officer, Other  (comment) (rollator) Vision Baseline Vision/History: 1 Wears glasses Patient Visual Report: No change from baseline Vision Assessment?: No apparent visual deficits;Wears glasses for reading Perception  Perception: Within Functional Limits Praxis Praxis: WFL Cognition Cognition Overall Cognitive Status: Within Functional Limits for tasks assessed Arousal/Alertness: Awake/alert Orientation Level: Person;Place;Situation Person: Oriented Place: Oriented Situation: Oriented Memory: Impaired Awareness: Appears intact Problem Solving: Appears intact Safety/Judgment: Appears intact Brief Interview for Mental Status (BIMS) Repetition of Three Words (First Attempt): 3 Temporal Orientation: Year: Correct Temporal Orientation: Month: Accurate within 5 days Temporal Orientation: Day: Correct Recall: "Sock": Yes, no cue required Recall: "Blue": No, could not recall Recall: "Bed": Yes, after cueing ("a piece of furniture") BIMS Summary Score: 12 Sensation Sensation Light Touch: Impaired by gross assessment Hot/Cold: Not tested Proprioception: Appears Intact Stereognosis: Not tested Additional Comments: decreased sensation LLE>RLE specifically L ankle and below. Coordination Gross Motor Movements are Fluid and Coordinated: No Fine Motor Movements are Fluid and Coordinated: No Coordination and Movement Description: new mild L hemiparesis, old CVA with mild R hemiparesis. 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 Mild LE ataxia with certain exercises Mobility  Bed Mobility Bed Mobility: Rolling Right;Rolling Left;Sit to Supine;Supine to Sit Rolling Right: Independent with assistive device Rolling Left: Independent with assistive device Supine to Sit: Independent with assistive  device Sit to Supine: Independent with assistive device Transfers Sit to Stand: Independent with assistive device Stand to Sit: Independent with assistive device  Trunk/Postural Assessment  Cervical Assessment Cervical Assessment: Exceptions to [facility] (mild forward head) Thoracic Assessment Thoracic Assessment: Exceptions to [facility] (mild thoracic rounding) Lumbar Assessment Lumbar Assessment: Within Functional Limits Postural Control Postural Control: Deficits on evaluation Righting Reactions: slightly delayed L>R Protective Responses: slightly delayed L>R  Balance Balance Balance Assessed: Yes Static Sitting Balance Static Sitting -  Balance Support: Feet supported;No upper extremity supported Static Sitting - Level of Assistance: 7: Independent Dynamic Sitting Balance Dynamic Sitting - Balance Support: Feet supported;No upper extremity supported Dynamic Sitting - Level of Assistance: 6: Modified independent (Device/Increase time) Static Standing Balance Static Standing - Balance Support: Bilateral upper extremity supported;During functional activity (rollator) Static Standing - Level of Assistance: 6: Modified independent (Device/Increase time) Dynamic Standing Balance Dynamic Standing - Balance Support: Bilateral upper extremity supported;During functional activity (rollator) Dynamic Standing - Level of Assistance: 6: Modified independent (Device/Increase time) Dynamic Standing - Comments: with transfers and gait Extremity/Trunk Assessment RUE Assessment RUE Assessment: Within Functional Limits General Strength Comments: 4-/5 triceps LUE Assessment LUE Assessment: Within Functional Limits General Strength Comments: 4-/5 triceps   Chamika Cunanan E Suhail Peloquin, MS, OTR/L  03/25/2024, 3:24 PM

## 2024-03-25 NOTE — Progress Notes (Signed)
 Physical Therapy Discharge Summary  Patient Details  Name: James [name]. MRN: [id number] Date of Birth: [birth year]  Date of Discharge from PT service:March 25, 2024  Today's Date: 03/25/2024 PT Individual Time: 6962-9528 PT Individual Time Calculation (min): 55 min   Patient has met 8 of 8 long term goals due to improved activity tolerance, improved balance, improved postural control, increased strength, ability to compensate for deficits, and improved coordination. Patient to discharge at an ambulatory level Modified Independent using rollator. Patient's care partner is independent to provide the necessary physical assistance at discharge. Pt's daughter and son in law present to observe final PT session prior to discharge.   All goals met  Recommendation:  Patient will benefit from ongoing skilled PT services in  independent living facility  to continue to advance safe functional mobility, address ongoing impairments in transfers, generalized strengthening and endurance, dynamic standing balance/coordination, gait training, and to minimize fall risk.  Equipment: No equipment provided - already has rollator  Reasons for discharge: treatment goals met and discharge from hospital  Patient/family agrees with progress made and goals achieved: Yes  Today's interventions: Received pt sitting in recliner with family present at bedside. Pt's daughter reporting pt's wife is downstairs in ED but pt is not aware. Pt agreeable to PT treatment and denied any pain during session. Session with emphasis on discharge planning, functional mobility/transfers, generalized strengthening and endurance, dynamic standing balance/coordination, simulated car transfers, stair navigation, and gait training. Went through sensation, MMT, and pain interference questionnaire in preparation for discharge.   Pt performed all transfers with rollator and mod I throughout session. Pt ambulated >335ft with rollator  and mod I to main therapy gym (ambulated through dayroom). Took seated rest break, then ambulated to staircase and navigated 12 6in steps with bilateral handrails and close supervision ascending with a step through and descending with a step to pattern. Ambulated 111ft with rollator and mod I to ortho gym and performed simulated car transfer with rollator and supervision with cues for safe entry - pt attempting to step out of car with LLE still in car. Pt then ambulated 59ft on uneven surfaces (ramp) with rollator and mod I. Pt then performed seated BUE/BLE strengthening on Nustep at workload 6 for 8 minutes with 1 rest break with emphasis on cardiovascular endurance. Ambulated >395ft with rollator and supervision/mod I back to room. Pt's family requesting grounds pass to take pt to visit wife in ED - MD made aware and discussed with nursing. Concluded session with pt sitting in recliner with all needs within reach. Pt's family cleared for transfers with pt and RN notified.   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 Rarely or not at all Pain Interference with Therapy Activities: 0. Does not apply - I have not received rehabilitationtherapy in the past 5 days Pain Interference with Day-to-Day Activities: 1. Rarely or not at all Cognition Overall Cognitive Status: Within Functional Limits for tasks assessed Arousal/Alertness: Awake/alert Orientation Level: Oriented X4 Memory: Impaired Awareness: Appears intact Problem Solving: Appears intact Safety/Judgment: Appears intact Sensation Sensation Light Touch: Impaired by gross assessment Hot/Cold: Not tested Proprioception: Appears Intact Stereognosis: Not tested Additional Comments: decreased sensation LLE>RLE specifically L  ankle and below. 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 Mild LE ataxia with certain exercises  Mobility Bed Mobility Bed Mobility: Rolling Right;Rolling Left;Sit to Supine;Supine to Sit Rolling Right: Independent with assistive device Rolling Left: Independent with assistive device Supine to Sit: Independent with assistive device Sit to Supine: Independent with assistive device Transfers Transfers: Sit to Stand;Stand to Sit;Stand Pivot Transfers Sit to Stand: Independent with assistive device Stand to Sit: Independent with assistive device Stand Pivot Transfers: Independent with assistive device Transfer (Assistive device): Rollator Locomotion  Gait Ambulation: Yes Gait Assistance: Independent with assistive device Gait Distance (Feet): 150 Feet Assistive device: Rollator Gait Gait: Yes Gait Pattern: Impaired Gait Pattern: Step-through pattern;Decreased step length - right;Decreased step length - left;Decreased stride length;Poor foot clearance - left;Poor foot clearance - right;Narrow base of support Gait velocity: decreased Stairs / Additional Locomotion Stairs: Yes Stairs Assistance: Supervision/Verbal cueing Stair Management Technique: Two rails;Forwards Number of Stairs: 12 Height of Stairs: 6 Ramp: Supervision/Verbal cueing (rollator) Pick up small object from the floor assist level: Supervision/Verbal cueing Pick up small object from the floor assistive device: rollator Wheelchair Mobility Wheelchair Mobility: No  Trunk/Postural Assessment   Cervical Assessment Cervical Assessment: Exceptions to [facility] (mild forward head) Thoracic Assessment Thoracic Assessment: Exceptions to [facility] (mild thoracic rounding) Lumbar Assessment Lumbar Assessment: Within Functional Limits Postural Control Postural Control: Deficits on evaluation Righting Reactions: slightly delayed L>R Protective Responses: slightly delayed L>R  Balance Balance Balance Assessed: Yes Static Sitting Balance Static Sitting - Balance Support: Feet supported;No upper extremity supported Static Sitting - Level of Assistance: 7: Independent Dynamic Sitting Balance Dynamic Sitting - Balance Support: Feet supported;No upper extremity supported Dynamic Sitting - Level of Assistance: 6: Modified independent (Device/Increase time) Static Standing Balance Static Standing - Balance Support: Bilateral upper extremity supported;During functional activity (rollator) Static Standing - Level of Assistance: 6: Modified independent (Device/Increase time) Dynamic Standing Balance Dynamic Standing - Balance Support: Bilateral upper extremity supported;During functional activity (rollator) Dynamic Standing - Level of Assistance: 6: Modified independent (Device/Increase time) Dynamic Standing - Comments: with transfers and gait Extremity Assessment  RLE Assessment RLE Assessment: Exceptions to [facility] Strength Comments: tested sitting in recliner RLE Strength Right Hip Flexion: 4/5 Right Hip ABduction: 4/5 Right Hip ADduction: 4/5 Right Knee Flexion: 4/5 Right Knee Extension: 4/5 Right Ankle Dorsiflexion: 4+/5 Right Ankle Plantar Flexion: 4+/5 LLE Assessment LLE Assessment: Exceptions to [facility] General Strength Comments: tested sitting in recliner LLE Strength Left Hip Flexion: 4/5 Left Hip ABduction: 4/5 Left Hip ADduction: 4/5 Left Knee Flexion: 4/5 Left Knee Extension: 4/5 Left Ankle Dorsiflexion: 4+/5 Left Ankle Plantar Flexion: 4+/5   Marlana Salvage Zaunegger Blima Rich PT, DPT 03/25/2024, 6:57 AM

## 2024-03-25 NOTE — Progress Notes (Signed)
 Occupational Therapy Session Note  Patient Details  Name: James [name]. MRN: [id number] Date of Birth: 1934-08-23  Today's Date: 03/25/2024 OT Individual Time: 0920-1015 & 1500-1508 OT Individual Time Calculation (min): 55 min & 8 min  OT missed time: 22 min Missed time reason: unwilling without medical reason    Short Term Goals: Week 1:  OT Short Term Goal 1 (Week 1): STG = LTG due to ELOS  Skilled Therapeutic Interventions/Progress Updates:  Session 1 Skilled OT intervention completed with focus on ADL retraining, functional mobility and DC planning. Pt received supine in bed, agreeable to session. No pain reported.  Pt politely declined shower in prep for DC. Transitioned supine to sit with mod I and increased time due to L lean however improved to normal posture. Completed all sit > stands and ambulatory transfers with mod I using rollator with good safety awareness and management of brakes.  Seated at sink on rollator, pt completed oral care mod I. Pt reports walk in shower with STE, shower curtain and shower chair with back and arm rests. Pt was able to demo supervision ambulatory transfer with rollator in/out of shower, without LOB, however cues provided to pt to slow his pace as his BLE did become slightly entangled with his quick turns that would increase fall risk.  Ambulated to ADL kitchen. In kitchen, pt demonstrated ability to reach into various cabinet heights, gather items including only light items, open/close fridge door, all at the supervision without LOB or fatigue while using rollator. Pt was able to prep a simple meal that he is familiar with accurate sequencing, safety awareness and higher level dual tasking. Pt did hold plate of food in one hand while steering rollator with education provided on use of rollator seat for hot items and plates in general for functional mobility safety.  Pt participated in the following activities in standing to address  cognitive strategies needed for independence and safety with BADL management: -Motor speed dot test- 1.97 sec, no difficulty; WNL motor processing -Trail Making A- 1:30 min, 0 errors -Trail Making B- 2:40 min, 2 errors, intermittent min assist needed for problem solving  Pt remained seated in recliner, with chair alarm on/activated, and with all needs in reach at end of session.  Session 2 Skilled OT intervention completed with focus on ambulatory/cardiovascular endurance. Pt received seated in recliner with family present having just visited with his wife in ED. Pt initially declined therapy due to fatigue, however agreeable to go for short walk at minimum with encouragement.  Pt completed mod I sit > stand and supervision/mod I ambulatory transfer with rollator for about 300 ft duration with cues needed to clear LLE as fatigue progressed. Pt also needed cues for managing opening door with rollator.   Pt declined further therapy or self care opportunities, therefore missed 22 mins of OT intervention. Pt remained seated in recliner, with family present and with all needs in reach at end of session.   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

## 2024-03-25 NOTE — Progress Notes (Signed)
 Inpatient Rehabilitation Discharge Medication Review by a Pharmacist  A complete drug regimen review was completed for this patient to identify any potential clinically significant medication issues.  High Risk Drug Classes Is patient taking? Indication by Medication  Antipsychotic No   Anticoagulant No   Antibiotic No   Opioid No   Antiplatelet Yes Plavix - secondary ppx CVA  Hypoglycemics/insulin No   Vasoactive Medication Yes Tamsulosin - BPH  Chemotherapy No   Other No Atorvastatin - HLD Xanax - GAD Finasteride - BPH Melatonin - insomnia Gabapentin - peripheral neuropathy      Type of Medication Issue Identified Description of Issue Recommendation(s)  Drug Interaction(s) (clinically significant)     Duplicate Therapy     Allergy     No Medication Administration End Date     Incorrect Dose     Additional Drug Therapy Needed     Significant med changes from prior encounter (inform family/care partners about these prior to discharge).    Other       Clinically significant medication issues were identified that warrant physician communication and completion of prescribed/recommended actions by midnight of the next day:  No  Name of provider notified for urgent issues identified:   Provider Method of Notification:     Pharmacist comments:   Time spent performing this drug regimen review (minutes):  30   Lilly Cove 03/25/2024 9:53 AM

## 2024-03-25 NOTE — Progress Notes (Signed)
 Speech Language Pathology Discharge Summary  Patient Details  Name: James [name]. MRN: [id number] Date of Birth: 15-Jan-1934  Date of Discharge from SLP service:March 25, 2024  Today's Date: 03/25/2024 SLP Individual Time: 4098-1191 SLP Individual Time Calculation (min): 54 min  Skilled Therapeutic Interventions:  SLP conducted skilled therapy session targeting cognitive retraining goals. SLP and patient discussed discharge plans. Patient recalled changes to anticipated discharge date with supervision. Targeted calendar organization task. Patient benefited from supervision for organization of calendar items. Using created calendar patient answered questions re: schedule management and ability to add additional items with modI  SLP retested patient's current cognitive status utilizing SLUMS examination. Patient scored 20/30 showing improvement from evaluation. In remaining minutes of session, targeting clock drawing and basic financial management tasks. Patient completed both with 100% accuracy given supervision assist. Patient was left in room with call bell in reach and alarm set. SLP will continue to target goals per plan of care.     Patient has met 2 of 2 long term goals.  Patient to discharge at overall Modified Independent;Supervision (swallowing: modI, cognition: supervision) level.  Reasons goals not met: n/a   Clinical Impression/Discharge Summary: Patient has made excellent progress during therapy admission meeting 2/2 long term goals set for duration. Patient currently tolerates Dys3/thin liquids with modI and benefits from supervision for cognitive tasks, especially iADLs. Patient may benefit from continued ST services at next venue of care to target lingering cognitive deficits. Patient and family education complete. SLP will sign off.    Care Partner:  Caregiver Able to Provide Assistance: Yes  Type of Caregiver Assistance: Cognitive  Recommendation:  Outpatient SLP   Rationale for SLP Follow Up: Maximize cognitive function and independence   Equipment: n/a   Reasons for discharge: Treatment goals met;Discharged from hospital   Patient/Family Agrees with Progress Made and Goals Achieved: Yes   Jeannie Done, M.A., CCC-SLP   Yetta Barre 03/25/2024, 12:26 PM

## 2024-03-26 DIAGNOSIS — I959 Hypotension, unspecified: Secondary | ICD-10-CM | POA: Diagnosis not present

## 2024-03-26 DIAGNOSIS — I639 Cerebral infarction, unspecified: Secondary | ICD-10-CM | POA: Diagnosis not present

## 2024-03-26 DIAGNOSIS — K5901 Slow transit constipation: Secondary | ICD-10-CM | POA: Diagnosis not present

## 2024-03-26 NOTE — Progress Notes (Signed)
 PROGRESS NOTE   Subjective/Complaints:  Pt doing well, ready go to home. Slept poorly again. Denies pain. LBM yesterday per pt. Urinating fine. Denies any other complaints or concerns today.   ROS: +insomnia, +dysphagia, +improved mood, stable cognition   Objective:   No results found. No results for input(s): "WBC", "HGB", "HCT", "PLT" in the last 72 hours.  No results for input(s): "NA", "K", "CL", "CO2", "GLUCOSE", "BUN", "CREATININE", "CALCIUM" in the last 72 hours.   Intake/Output Summary (Last 24 hours) at 03/26/2024 1610 Last data filed at 03/26/2024 0800 Gross per 24 hour  Intake 540 ml  Output 550 ml  Net -10 ml        Physical Exam: Vital Signs Blood pressure 107/64, pulse 64, temperature (!) 97.5 F (36.4 C), resp. rate 16, SpO2 99%. Gen: no distress, normal appearing, resting in bed comfortably HEENT: oral mucosa pink and moist, NCAT Cardio: Reg rate and rhythm, no m/r/g appreciated Chest: normal effort, normal rate of breathing, CTAB Abd: soft, non-distended, slightly hypoactive BS, nonTTP Ext: no edema Psych: pleasant, normal affect Skin: intact on exposed surfaces  PRIOR EXAMS: Neurologic: Cranial nerves II through XII intact, motor strength is 5-/5 in bilateral deltoid, bicep, tricep, grip, 5/5 right and 4/5 left hip flexor, knee extensors, ankle dorsiflexor and plantar flexor Sensory exam normal sensation to light touch in LEs except Left great toe Cerebellar exam normal finger to nose to finger as well as heel to shin in bilateral upper and lower extremities Finger to thumb opposition is intact. Oriented x 3 Speech without dysarthria Musculoskeletal: Full range of motion in all 4 extremities. No joint swelling, stable 4/11   Assessment/Plan: 1. Functional deficits which require 3+ hours per day of interdisciplinary therapy in a comprehensive inpatient rehab setting. Physiatrist is providing  close team supervision and 24 hour management of active medical problems listed below. Physiatrist and rehab team continue to assess barriers to discharge/monitor patient progress toward functional and medical goals  Care Tool:  Bathing    Body parts bathed by patient: Right arm, Left arm, Chest, Abdomen, Front perineal area, Buttocks, Right upper leg, Left upper leg, Right lower leg, Left lower leg, Face         Bathing assist Assist Level: Supervision/Verbal cueing     Upper Body Dressing/Undressing Upper body dressing   What is the patient wearing?: Pull over shirt    Upper body assist Assist Level: Set up assist    Lower Body Dressing/Undressing Lower body dressing      What is the patient wearing?: Underwear/pull up, Pants     Lower body assist Assist for lower body dressing: Set up assist     Toileting Toileting    Toileting assist Assist for toileting: Supervision/Verbal cueing     Transfers Chair/bed transfer  Transfers assist     Chair/bed transfer assist level: Independent with assistive device Chair/bed transfer assistive device: Other (rollator)   Locomotion Ambulation   Ambulation assist      Assist level: Independent with assistive device Assistive device: Rollator Max distance: >164ft   Walk 10 feet activity   Assist     Assist level: Independent with assistive device Assistive device:  Rollator   Walk 50 feet activity   Assist    Assist level: Independent with assistive device Assistive device: Rollator    Walk 150 feet activity   Assist Walk 150 feet activity did not occur: Safety/medical concerns (fatigue)  Assist level: Independent with assistive device Assistive device: Rollator    Walk 10 feet on uneven surface  activity   Assist Walk 10 feet on uneven surfaces activity did not occur: Safety/medical concerns (fatigue)   Assist level: Supervision/Verbal cueing Assistive device: Rollator    Wheelchair     Assist Is the patient using a wheelchair?: No   Wheelchair activity did not occur: N/A         Wheelchair 50 feet with 2 turns activity    Assist    Wheelchair 50 feet with 2 turns activity did not occur: N/A       Wheelchair 150 feet activity     Assist  Wheelchair 150 feet activity did not occur: N/A       Blood pressure 107/64, pulse 64, temperature (!) 97.5 F (36.4 C), resp. rate 16, SpO2 99%.  Medical Problem List and Plan: 1. 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             -patient may  shower             -ELOS/Goals: 14-16d Supervision goals   Metanx started  Lives in independent living with his wife  [employer]ay [employer] pass ordered  B vitamins and D3 started  Updated daughter  -03/26/24 d/c today, meds reviewed.   2.  Impaired mobility: continue Lovenox             -antiplatelet therapy: Monotherapy with Plavix 75 mg daily  3. Left hand pain : continue Neurontin 100 mg twice daily, Tylenol as needed. Vitamin C, D and B supplements started  4. Insomnia: Alprazolam scheduled HS, melatonin scheduled HS  5. Neuropsych/cognition: This patient is not capable of making decisions on his own behalf.  6. Skin/Wound Care: Routine skin checks  7. Fluids/Electrolytes/Nutrition: Routine in and outs with follow-up chemistries  8.  BPH.  continue Proscar 5 mg daily, Flomax 0.4 mg- changed to after supper  9.  Hyperlipidemia.  continue Lipitor  10.  Constipation. Had BM 4/9, d/c miralax  -03/26/24 LBM yesterday per pt, cont outpatient management  11.  History of tobacco use.  Provide counseling  12.  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  Follow-up outpatient  13. Suboptimal Vitamin D level: D3 started,  discussed that mood has improved  14. Congestion: Will d/c Mucinex as per patient's preference  15. Dysphagia: continue SLP, supervision for meals  16. Mild cognitive impairment: continue SLP, discussed with daughter that this seems to be at baseline  17. Hypotension: continue to monitor BP TID  -03/26/24 BPs better, monitor outpatient  Vitals:   03/22/24 2012 03/23/24 0441 03/23/24 0742 03/23/24 1541  BP: 123/69 130/71 138/74 121/85   03/23/24 2015 03/24/24 0508 03/24/24 1325 03/24/24 2021  BP: (!) 144/59 115/74 104/69 111/66   03/25/24 0618 03/25/24 1534 03/25/24 2006 03/26/24 0514  BP: (!) 124/59 127/70 125/68 107/64     18. Bradycardia: continue to monitor HR TID     LOS: 5 days A FACE TO FACE EVALUATION WAS PERFORMED  [address] 03/26/2024, 8:22 AM

## 2024-03-26 NOTE — Progress Notes (Signed)
 Inpatient Rehabilitation Discharge Medication Review by a Pharmacist  A complete drug regimen review was completed for this patient to identify any potential clinically significant medication issues.  High Risk Drug Classes Is patient taking? Indication by Medication  Antipsychotic No   Anticoagulant No   Antibiotic No   Opioid No   Antiplatelet Yes Plavix - secondary ppx CVA  Hypoglycemics/insulin No   Vasoactive Medication Yes Tamsulosin - BPH  Chemotherapy No   Other No Atorvastatin - HLD Xanax - GAD Finasteride - BPH Melatonin - insomnia Gabapentin - peripheral neuropathy      Type of Medication Issue Identified Description of Issue Recommendation(s)  Drug Interaction(s) (clinically significant)     Duplicate Therapy     Allergy     No Medication Administration End Date     Incorrect Dose     Additional Drug Therapy Needed     Significant med changes from prior encounter (inform family/care partners about these prior to discharge).    Other       Clinically significant medication issues were identified that warrant physician communication and completion of prescribed/recommended actions by midnight of the next day:  No  Name of provider notified for urgent issues identified:   Provider Method of Notification:     Pharmacist comments:   Time spent performing this drug regimen review (minutes):  30   Destanee Bedonie A Kamari Bilek 03/26/2024 8:44 AM

## 2024-04-11 ENCOUNTER — Encounter: Attending: Physical Medicine and Rehabilitation | Admitting: Physical Medicine and Rehabilitation

## 2024-04-11 ENCOUNTER — Encounter: Payer: Self-pay | Admitting: Physical Medicine and Rehabilitation

## 2024-04-11 VITALS — BP 110/70 | HR 82 | Ht 68.0 in | Wt 156.0 lb

## 2024-04-11 DIAGNOSIS — I639 Cerebral infarction, unspecified: Secondary | ICD-10-CM | POA: Insufficient documentation

## 2024-04-11 DIAGNOSIS — M79602 Pain in left arm: Secondary | ICD-10-CM | POA: Diagnosis present

## 2024-04-11 DIAGNOSIS — G4701 Insomnia due to medical condition: Secondary | ICD-10-CM | POA: Diagnosis present

## 2024-04-11 MED ORDER — GABAPENTIN 100 MG PO CAPS: 100.0000 mg | ORAL_CAPSULE | Freq: Two times a day (BID) | ORAL | 3 refills | Status: AC

## 2024-04-11 NOTE — Patient Instructions (Signed)
 30 minutes sunlight per day or daily D3  Methylated B vitamin, green leafy vegetables

## 2024-04-11 NOTE — Progress Notes (Signed)
 Subjective:    Patient ID: James [name]., male    DOB: 09-08-1934, 89 y.o.   MRN: [id number]  HPI 1) CVA -his on asks about supplements that he was on  2) Pain -has pain in his left arm  3) Insomnia: -Xanax  helps him to sleep and helps to relieve his itching  Pain Inventory Average Pain 8 Pain Right Now 7 My pain is aching  LOCATION OF PAIN  left hand  BOWEL Number of stools per week: 7 Oral laxative use Yes  Type of laxative stool softener   BLADDER Normal Frequent urination Yes   Mobility walk with assistance use a walker how many minutes can you walk? 10 ability to climb steps?  yes do you drive?  no  Function retired I need assistance with the following:  meal prep, household duties, and shopping  Neuro/Psych weakness trouble walking  Prior Studies Any changes since last visit?  yes  has not seen PCP or neuro. Son reports he refuses to work with PT and he reports he does his own exercises. He also refused the neurologist last stroke and son is not hopeful about this time.  Physicians involved in your care Any changes since last visit?  no   Family History  Problem Relation Age of Onset   Prostate cancer Father    Social History   Socioeconomic History   Marital status: Married    Spouse name: Not on file   Number of children: Not on file   Years of education: Not on file   Highest education level: Not on file  Occupational History   Not on file  Tobacco Use   Smoking status: Former    Types: Cigarettes   Smokeless tobacco: Never  Vaping Use   Vaping status: Never Used  Substance and Sexual Activity   Alcohol  use: Not Currently   Drug use: Never   Sexual activity: Yes    Birth control/protection: None  Other Topics Concern   Not on file  Social History Narrative   Not on file   Social Drivers of Health   Financial Resource Strain: Not on file  Food Insecurity: No Food Insecurity (03/17/2024)   Hunger Vital Sign     Worried About Running Out of Food in the Last Year: Never true    Ran Out of Food in the Last Year: Never true  Transportation Needs: No Transportation Needs (03/17/2024)   PRAPARE - Administrator, Civil Service (Medical): No    Lack of Transportation (Non-Medical): No  Physical Activity: Not on file  Stress: Not on file  Social Connections: Moderately Integrated (03/18/2024)   Social Connection and Isolation Panel [NHANES]    Frequency of Communication with Friends and Family: Three times a week    Frequency of Social Gatherings with Friends and Family: Three times a week    Attends Religious Services: More than 4 times per year    Active Member of Clubs or Organizations: No    Attends Banker Meetings: Never    Marital Status: Married   Past Surgical History:  Procedure Laterality Date   HAND SURGERY Left    HEMORRHOID SURGERY     TONSILLECTOMY AND ADENOIDECTOMY     Past Medical History:  Diagnosis Date   Anxiety    TIA (transient ischemic attack)    Ht 5\' 8"  (1.727 m)   Wt 156 lb (70.8 kg)   BMI 23.72 kg/m   Opioid Risk Score:  Fall Risk Score:  `1  Depression screen [facility] 2/9     04/11/2024    3:10 PM 10/02/2023    1:02 PM  Depression screen PHQ 2/9  Decreased Interest 0 0  Down, Depressed, Hopeless 0 0  PHQ - 2 Score 0 0  Altered sleeping 1 0  Tired, decreased energy 1 2  Change in appetite 0 1  Feeling bad or failure about yourself  0 0  Trouble concentrating 0 0  Moving slowly or fidgety/restless 0 0  Suicidal thoughts 0 0  PHQ-9 Score 2 3     Review of Systems  Genitourinary:  Positive for frequency.  Musculoskeletal:  Positive for gait problem.       Left hand pain  Neurological:  Positive for weakness.  Hematological:  Bruises/bleeds easily.       Plavix   Psychiatric/Behavioral:         Has issues with his xanax  dose  All other systems reviewed and are negative.      Objective:   Physical Exam Gen: no distress, normal  appearing HEENT: oral mucosa pink and moist, NCAT Cardio: Reg rate Chest: normal effort, normal rate of breathing Abd: soft, non-distended Ext: no edema Psych: pleasant, normal affect Skin: intact Neuro: Alert and oriented  MSK: ambulating with rollator       Assessment & Plan:  1) CVA -continue rollator -continue D3 supplement -continue PT and OT -discussed that he is walking on his own and does exercises in his bed -discussed that he has not seen a neurologist -continue lipitor and plavix   2) Pain -discussed that the pain in his left arm -continue gabapentin   3) Insomnia -discussed that he has been receiving 1mg  in the mail  4) BPH:  Continue flomax  or proscar 

## 2024-04-22 NOTE — ED Provider Notes (Signed)
 [facility] Provider Note   CSN: [id number] Arrival date & time: 03/17/24  1013     History  Chief Complaint  Patient presents with   Shortness of Breath    James Shave. is a 89 y.o. male.  HPI      89yo male with history of anxiety, PAD, descending aortic aneurysm, admission for left sided weakness with right sided CVA 08/2023, who presents with new onset right sided numbness and dyspnea.  Symptoms started around breaskfast yesterday with numbness and ginling of his right upper and lower estremity.  Denies visual changes, slurred speech, gait changes, confusion.    Also reports shortness of breath that started yesterday.  Past Medical History:  Diagnosis Date   Anxiety    TIA (transient ischemic attack)      Home Medications Prior to Admission medications   Medication Sig Start Date End Date Taking? Authorizing Provider  acetaminophen  (TYLENOL ) 325 MG tablet Take 2 tablets (650 mg total) by mouth every 6 (six) hours as needed for mild pain (or Fever >/= 101). 09/17/23  Yes Angiulli, Everlyn Hockey, PA-C  docusate sodium  (COLACE) 100 MG capsule Take 100 mg by mouth in the morning.   Yes [provider]  polyethylene glycol (MIRALAX  / GLYCOLAX ) 17 g packet Take 17 g by mouth daily. Patient taking differently: Take 17 g by mouth daily as needed for mild constipation. 09/08/23  Yes Luna Salinas, MD  ALPRAZolam  (XANAX ) 0.25 MG tablet Take 1 tablet (0.25 mg total) by mouth at bedtime. 03/25/24   Angiulli, Everlyn Hockey, PA-C  atorvastatin  (LIPITOR) 40 MG tablet Take 1 tablet (40 mg total) by mouth daily. 03/25/24   Angiulli, Everlyn Hockey, PA-C  clopidogrel  (PLAVIX ) 75 MG tablet Take 1 tablet (75 mg total) by mouth daily. 03/25/24   Angiulli, Everlyn Hockey, PA-C  finasteride  (PROSCAR ) 5 MG tablet Take 1 tablet (5 mg total) by mouth daily. 03/25/24   Angiulli, Everlyn Hockey, PA-C  gabapentin  (NEURONTIN ) 100 MG capsule Take 1 capsule (100 mg total) by  mouth 2 (two) times daily. 04/11/24   Raulkar, Krutika P, MD  melatonin 3 MG TABS tablet Take 1 tablet (3 mg total) by mouth at bedtime. 03/25/24   Angiulli, Everlyn Hockey, PA-C  tamsulosin  (FLOMAX ) 0.4 MG CAPS capsule Take 1 capsule (0.4 mg total) by mouth daily. 03/25/24   Angiulli, Everlyn Hockey, PA-C  vitamin D3 (CHOLECALCIFEROL ) 25 MCG tablet Take 1 tablet (1,000 Units total) by mouth daily. 03/25/24   Angiulli, Everlyn Hockey, PA-C      Allergies    Patient has no known allergies.    Review of Systems   Review of Systems  Physical Exam Updated Vital Signs BP 124/74 (BP Location: Right Arm)   Pulse 61   Temp 97.7 F (36.5 C)   Resp 18   Ht 5\' 8"  (1.727 m)   Wt 73.5 kg   SpO2 93%   BMI 24.64 kg/m  Physical Exam Vitals and nursing note reviewed.  Constitutional:      General: James [name] is not in acute distress.    Appearance: Normal appearance. James [name] is well-developed. James [name] is not ill-appearing or diaphoretic.  HENT:     Head: Normocephalic and atraumatic.  Eyes:     General: No visual field deficit.    Extraocular Movements: Extraocular movements intact.     Conjunctiva/sclera: Conjunctivae normal.     Pupils: Pupils are equal, round, and reactive to light.  Cardiovascular:  Rate and Rhythm: Normal rate and regular rhythm.     Pulses: Normal pulses.     Heart sounds: Normal heart sounds. No murmur heard.    No friction rub. No gallop.  Pulmonary:     Effort: Pulmonary effort is normal. No respiratory distress.     Breath sounds: Normal breath sounds. No wheezing or rales.  Abdominal:     General: There is no distension.     Palpations: Abdomen is soft.     Tenderness: There is no abdominal tenderness. There is no guarding.  Musculoskeletal:        General: No swelling or tenderness.     Cervical back: Normal range of motion.  Skin:    General: Skin is warm and dry.     Findings: No erythema or rash.  Neurological:     General: No focal deficit present.     Mental Status: James [name] is alert.      GCS: GCS eye subscore is 4. GCS verbal subscore is 5. GCS motor subscore is 6.     Cranial Nerves: No cranial nerve deficit, dysarthria or facial asymmetry.     Sensory: No sensory deficit.     Motor: No tremor.     Coordination: Coordination normal. Finger-Nose-Finger Test normal.     Gait: Gait normal.     Comments: Minimal right sensation change, questionable minimal right sided weakness     ED Results / Procedures / Treatments   Labs (all labs ordered are listed, but only abnormal results are displayed) Labs Reviewed  CBC WITH DIFFERENTIAL/PLATELET - Abnormal; Notable for the following components:      Result Value   Hemoglobin 12.8 (*)    All other components within normal limits  COMPREHENSIVE METABOLIC PANEL WITH GFR - Abnormal; Notable for the following components:   Glucose, Bld 123 (*)    BUN 26 (*)    GFR, Estimated 59 (*)    All other components within normal limits  URINALYSIS, W/ REFLEX TO CULTURE (INFECTION SUSPECTED) - Abnormal; Notable for the following components:   Hgb urine dipstick SMALL (*)    All other components within normal limits  HEMOGLOBIN A1C - Abnormal; Notable for the following components:   Hgb A1c MFr Bld 5.7 (*)    All other components within normal limits  CBC WITH DIFFERENTIAL/PLATELET - Abnormal; Notable for the following components:   Hemoglobin 12.5 (*)    Platelets 145 (*)    All other components within normal limits  CBC WITH DIFFERENTIAL/PLATELET - Abnormal; Notable for the following components:   Platelets 145 (*)    All other components within normal limits  COMPREHENSIVE METABOLIC PANEL WITH GFR - Abnormal; Notable for the following components:   BUN 27 (*)    GFR, Estimated 57 (*)    All other components within normal limits  COMPREHENSIVE METABOLIC PANEL WITH GFR - Abnormal; Notable for the following components:   Glucose, Bld 100 (*)    BUN 28 (*)    Creatinine, Ser 1.26 (*)    Calcium  8.8 (*)    GFR, Estimated 55 (*)     All other components within normal limits  RENAL FUNCTION PANEL - Abnormal; Notable for the following components:   BUN 27 (*)    Albumin 3.3 (*)    All other components within normal limits  CBG MONITORING, ED - Abnormal; Notable for the following components:   Glucose-Capillary 154 (*)    All other components within normal limits  RESP  PANEL BY RT-PCR (RSV, FLU A&B, COVID)  RVPGX2  BRAIN NATRIURETIC PEPTIDE  COMPREHENSIVE METABOLIC PANEL WITH GFR  MAGNESIUM   LIPID PANEL  MAGNESIUM   MAGNESIUM   MAGNESIUM   TROPONIN I (HIGH SENSITIVITY)  TROPONIN I (HIGH SENSITIVITY)    EKG EKG Interpretation Date/Time:  Thursday March 17 2024 10:22:35 EDT Ventricular Rate:  95 PR Interval:  212 QRS Duration:  96 QT Interval:  347 QTC Calculation: 437 R Axis:   88  Text Interpretation: Sinus rhythm Borderline prolonged PR interval Confirmed by Guadalupe Lee ([id number]) on 03/17/2024 3:30:27 PM  Radiology No results found.  Procedures Procedures    Medications Ordered in ED Medications  perflutren  lipid microspheres (DEFINITY ) IV suspension (2 mLs Intravenous Given 03/18/24 1535)   stroke: early stages of recovery book ( Does not apply Given 03/18/24 0909)  iohexol  (OMNIPAQUE ) 350 MG/ML injection 75 mL (75 mLs Intravenous Contrast Given 03/17/24 2142)    ED Course/ Medical Decision Making/ A&P                                  89yo male with history of anxiety, PAD, descending aortic aneurysm, admission for left sided weakness with right sided CVA 08/2023, who presents with new onset right sided numbness beginning yesterday morning.  Regarding numbness--ddx includes new stroke, stroke recrudescence in setting of other illness, electrolyte abnormality, cardiac symptoms. Low suspicion for aortic dissection by hx and exam.  Regarding dyspnea--ddx also includes electrolyte abnormalities, anemia, ACS, PE, pneumonia, viral syndrome, CHF.  EKG evaluated by me showing sinus rhythm.  Labs personally  evaluated by me show no sign of ACS with normal troponin, normal BNP and doubt CHF, no clinically significant electrolyte abnormalities or anemia, negative covid/flu/rsv, CXR without acute findings no pneumonia nor pneumothorax.  CT and MRI pending at time of transfer of care.          Final Clinical Impression(s) / ED Diagnoses Final diagnoses:  Acute CVA (cerebrovascular accident) [facility])    Rx / DC Orders ED Discharge Orders          Ordered    atorvastatin  (LIPITOR) 40 MG tablet  Daily,   Status:  Discontinued        03/21/24 1126    gabapentin  (NEURONTIN ) 100 MG capsule  2 times daily,   Status:  Discontinued        03/21/24 1126    Increase activity slowly        03/21/24 1126    Discharge instructions       Comments: 1. You are being discharged to inpatient rehabilitation at [facility] 2. Currently recommended for dysphagia 3 diet with thin liquids 3. Take Lipitor 40 mg daily 4. Consult cardiology for Zio patch at [facility] 5. Needs outpatient neurology and cardiology follow up 6. Follow up with Dr. Varadarajan after d/c from [facility]   03/21/24 1126    Call MD for:  persistant dizziness or light-headedness        03/21/24 1126    Call MD for:  extreme fatigue        03/21/24 1126    Call MD for:  difficulty breathing, headache or visual disturbances        03/21/24 1126              Scarlette Currier, MD 04/22/24 1344

## 2024-08-26 ENCOUNTER — Ambulatory Visit: Admitting: Student

## 2024-09-28 ENCOUNTER — Ambulatory Visit: Admitting: Cardiovascular Disease

## 2024-11-01 ENCOUNTER — Ambulatory Visit: Attending: Cardiology | Admitting: Cardiology

## 2024-11-01 ENCOUNTER — Encounter: Payer: Self-pay | Admitting: Cardiology

## 2024-11-01 VITALS — BP 104/58 | HR 88 | Ht 68.0 in | Wt 155.9 lb

## 2024-11-01 DIAGNOSIS — I639 Cerebral infarction, unspecified: Secondary | ICD-10-CM

## 2024-11-01 NOTE — Patient Instructions (Addendum)
 Medication Instructions:  Your physician recommends that you continue on your current medications as directed. Please refer to the Current Medication list given to you today.  Labwork: None ordered.  Testing/Procedures: None ordered.  Follow-Up:  As needed with Dr. Jimmey Ralph  Implantable Loop Recorder Placement, Care After This sheet gives you information about how to care for yourself after your procedure. Your health care provider may also give you more specific instructions. If you have problems or questions, contact your health care provider. What can I expect after the procedure? After the procedure, it is common to have: Soreness or discomfort near the incision. Some swelling or bruising near the incision.  Follow these instructions at home: Incision care  Monitor your cardiac device site for redness, swelling, and drainage. Call the device clinic at [phone] if you experience these symptoms or fever/chills.  Keep the large square bandage on your site for 24 hours and then you may remove it yourself. Keep the steri-strips underneath in place.   You may shower after 72 hours / 3 days from your procedure with the steri-strips in place. They will usually fall off on their own, or may be removed after 10 days. Pat dry.   Avoid lotions, ointments, or perfumes over your incision until it is well-healed.  Please do not submerge in water until your site is completely healed.   Your device is MRI compatible.   Remote monitoring is used to monitor your cardiac device from home. This monitoring is scheduled every month by our office. It allows [place] to keep an eye on the function of your device to ensure it is working properly.  If your wound site starts to bleed apply pressure.    If you have any questions/concerns please call the device clinic at [phone].  Activity  Return to your normal activities.  General instructions Follow instructions from your health care provider  about how to manage your implantable loop recorder and transmit the information. Learn how to activate a recording if this is necessary for your type of device. You may go through a metal detection gate, and you may let someone hold a metal detector over your chest. Show your ID card if needed. Do not have an MRI unless you check with your health care provider first. Take over-the-counter and prescription medicines only as told by your health care provider. Keep all follow-up visits as told by your health care provider. This is important. Contact a health care provider if: You have redness, swelling, or pain around your incision. You have a fever. You have pain that is not relieved by your pain medicine. You have triggered your device because of fainting (syncope) or because of a heartbeat that feels like it is racing, slow, fluttering, or skipping (palpitations). Get help right away if you have: Chest pain. Difficulty breathing. Summary After the procedure, it is common to have soreness or discomfort near the incision. Change your dressing as told by your health care provider. Follow instructions from your health care provider about how to manage your implantable loop recorder and transmit the information. Keep all follow-up visits as told by your health care provider. This is important.  This information is not intended to replace advice given to you by your health care provider. Make sure you discuss any questions you have with your health care provider. Document Released: 11/12/2015 Document Revised: 01/16/2018 Document Reviewed: 01/16/2018 Elsevier Patient Education  2020 [employer].

## 2024-11-01 NOTE — Progress Notes (Signed)
 Electrophysiology Office Note:   Date:  11/01/2024  ID:  James [name]., DOB Mar 10, 1934, MRN [id number]  Primary Cardiologist: None Electrophysiologist: James Kitty, MD      History of Present Illness:   James [name]. is a 89 y.o. male with h/o cryptogenic stroke, PAD, aortic arch aneurysm  who is being seen today for evaluation for loop recorder.  Admitted 9/19-24/2024 with acute onset L-sided weakness, found to have R thalamic capsular infarct, punctate infarcts in the L frontal lobe.  Neurology consulted with concerns for cardioembolic source given strokes in multiple vascular territories.  He was discharged with zio monitor.   Discussed the use of AI scribe software for clinical note transcription with the patient, who gave verbal consent to proceed.  History of Present Illness James [name]. is an 89 year old male with a history of stroke who presents for consideration of a loop recorder implantation. He was evaluated by a physician's assistant, James [name], in [place] about a year ago for the possibility of a loop recorder due to his history of stroke and fast heart rhythms.  He has a history of stroke and fast heart rhythms detected on a monitor, although no sustained arrhythmias were noted. MRI studies suggest the strokes may be of cardiac origin, possibly due to atrial fibrillation, although this has not been confirmed. The exact cause of his strokes remains unclear.  He is interested in the loop recorder to monitor for atrial fibrillation. He is currently living independently and wishes to maintain this lifestyle.  He has not seen a loop recorder before and is curious about its function and the mechanism of data transmission. He expresses concerns about how the device will transmit data given his current issues with cell phone signal at home.   Review of systems complete and found to be negative unless listed in HPI.   EP Information / Studies  Reviewed:    Zio 09/2023 HR 40 - 141, average 78 bpm. 1 nonsustained VT lasting 7 beats. 1895 SVT episodes, longest lasting 46 seconds. Review of rhythm strip suggests AT. Occasional supraventricular ectopy, 2.4% Rare ventricular ectopy. No atrial fibrillation.  Echo 03/18/24:  1. Left ventricular ejection fraction, by estimation, is 60 to 65%. The  left ventricle has normal function. The left ventricle has no regional  wall motion abnormalities. There is mild concentric left ventricular  hypertrophy. Left ventricular diastolic  parameters are consistent with Grade I diastolic dysfunction (impaired  relaxation).   2. Right ventricular systolic function is normal. The right ventricular  size is normal.   3. The mitral valve is normal in structure. No evidence of mitral valve  regurgitation. No evidence of mitral stenosis.   4. The aortic valve is normal in structure. There is mild calcification  of the aortic valve. Aortic valve regurgitation is not visualized. No  aortic stenosis is present.   5. The inferior vena cava is normal in size with greater than 50%  respiratory variability, suggesting right atrial pressure of 3 mmHg.   Physical Exam:   VS:  There were no vitals taken for this visit.   Wt Readings from Last 3 Encounters:  04/11/24 156 lb (70.8 kg)  03/17/24 162 lb 0.6 oz (73.5 kg)  10/08/23 151 lb 6.4 oz (68.7 kg)     GEN: Well nourished, well developed in no acute distress NECK: No JVD CARDIAC: Normal rate, regular rhythm RESPIRATORY:  Clear to auscultation without rales, wheezing or rhonchi  ABDOMEN:  Soft, non-distended EXTREMITIES:  No edema; No deformity   ASSESSMENT AND PLAN:    #Cryptogenic stroke: Discussed atrial fibrillation as possible etiology for stroke.  Discussed role of loop recorder for monitoring for atrial fibrillation.  Discussed risk and benefits of loop recorder implant.  Discussed monthly monitoring.  Patient voiced understanding with all  of these topics and elected to proceed with implant.  SURGEON:  James Kitty, MD     PREPROCEDURE DIAGNOSIS:  Cryptogenic stroke    POSTPROCEDURE DIAGNOSIS: Cryptogenic stroke     PROCEDURES:   1. Implantable loop recorder implantation    INTRODUCTION:  James [name]. presents with a history of cryptogenic stroke The costs of loop recorder monitoring have been discussed with the patient.    DESCRIPTION OF PROCEDURE:  Informed written consent was obtained.   Time Out Completed with RN    The patient required no sedation for the procedure today.  Mapping over the patient's chest was performed to identify the area where electrograms were most prominent for ILR recording.  This area was found to be the left parasternal region over the 4th intercostal space. The patients left chest was therefore prepped and draped in the usual sterile fashion. The skin overlying the left parasternal region was infiltrated with lidocaine for local analgesia.  A 0.5-cm incision was made over the left parasternal region over the 3rd intercostal space.  A subcutaneous ILR pocket was fashioned using a combination of sharp and blunt dissection.  A [employer] LUX-Dx II+ implantable loop recorder (serial # [id number]) was then placed into the pocket  R waves were very prominent and measuring 0.80mV.  Steri-strips and a sterile dressing were then applied.  There were no early apparent complications.     CONCLUSIONS:   1. Successful implantation of a implantable loop recorder for a history of cryptogenic stroke  2. No early apparent complications.   Follow up with EP Team as needed   Signed, James Kitty, MD

## 2024-12-02 ENCOUNTER — Ambulatory Visit

## 2024-12-02 DIAGNOSIS — I639 Cerebral infarction, unspecified: Secondary | ICD-10-CM | POA: Diagnosis not present

## 2024-12-02 LAB — CUP PACEART REMOTE DEVICE CHECK
Date Time Interrogation Session: 20251218074700
Implantable Pulse Generator Implant Date: 20251118
Pulse Gen Serial Number: 163771

## 2024-12-02 NOTE — Progress Notes (Signed)
 Remote Loop Recorder Transmission

## 2024-12-11 ENCOUNTER — Ambulatory Visit: Payer: Self-pay | Admitting: Cardiology

## 2025-01-02 ENCOUNTER — Ambulatory Visit: Attending: Internal Medicine

## 2025-02-02 ENCOUNTER — Ambulatory Visit

## 2025-03-05 ENCOUNTER — Ambulatory Visit

## 2025-04-05 ENCOUNTER — Ambulatory Visit

## 2025-05-06 ENCOUNTER — Ambulatory Visit

## 2025-06-06 ENCOUNTER — Ambulatory Visit

## 2025-07-07 ENCOUNTER — Ambulatory Visit

## 2025-08-07 ENCOUNTER — Ambulatory Visit

## 2025-09-07 ENCOUNTER — Ambulatory Visit
